# Patient Record
Sex: Male | Born: 1995 | Race: Black or African American | Hispanic: No | Marital: Single | Smoking: Never smoker
Health system: Southern US, Community
[De-identification: ages and names within clinical notes are randomized; demographics above are authoritative.]

## PROBLEM LIST (undated history)

## (undated) DIAGNOSIS — I10 Essential (primary) hypertension: Secondary | ICD-10-CM

## (undated) DIAGNOSIS — N186 End stage renal disease: Secondary | ICD-10-CM

## (undated) DIAGNOSIS — N051 Unspecified nephritic syndrome with focal and segmental glomerular lesions: Secondary | ICD-10-CM

## (undated) DIAGNOSIS — J189 Pneumonia, unspecified organism: Secondary | ICD-10-CM

## (undated) DIAGNOSIS — R112 Nausea with vomiting, unspecified: Secondary | ICD-10-CM

## (undated) DIAGNOSIS — J9600 Acute respiratory failure, unspecified whether with hypoxia or hypercapnia: Secondary | ICD-10-CM

---
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
## 2014-07-19 ENCOUNTER — Emergency Department (HOSPITAL_COMMUNITY)
Admission: EM | Admit: 2014-07-19 | Discharge: 2014-07-20 | Disposition: A | Discharge status: Home / Self Care | Payer: Medicaid Other | Attending: Emergency Medicine | Admitting: Emergency Medicine

## 2014-07-19 ENCOUNTER — Encounter (HOSPITAL_COMMUNITY): Payer: Self-pay | Admitting: Emergency Medicine

## 2014-07-19 DIAGNOSIS — N051 Unspecified nephritic syndrome with focal and segmental glomerular lesions: Secondary | ICD-10-CM | POA: Diagnosis not present

## 2014-07-19 DIAGNOSIS — Z79899 Other long term (current) drug therapy: Secondary | ICD-10-CM | POA: Insufficient documentation

## 2014-07-19 DIAGNOSIS — R1033 Periumbilical pain: Secondary | ICD-10-CM | POA: Insufficient documentation

## 2014-07-19 DIAGNOSIS — N184 Chronic kidney disease, stage 4 (severe): Secondary | ICD-10-CM | POA: Insufficient documentation

## 2014-07-19 DIAGNOSIS — R112 Nausea with vomiting, unspecified: Secondary | ICD-10-CM | POA: Insufficient documentation

## 2014-07-19 DIAGNOSIS — R197 Diarrhea, unspecified: Secondary | ICD-10-CM | POA: Diagnosis not present

## 2014-07-19 LAB — COMPREHENSIVE METABOLIC PANEL
ALT: 16 U/L (ref 0–53)
AST: 20 U/L (ref 0–37)
Albumin: 3.6 g/dL (ref 3.5–5.2)
Alkaline Phosphatase: 85 U/L (ref 39–117)
Anion gap: 14 (ref 5–15)
BUN: 46 mg/dL — AB (ref 6–23)
CALCIUM: 7.3 mg/dL — AB (ref 8.4–10.5)
CO2: 24 mmol/L (ref 19–32)
CREATININE: 5.24 mg/dL — AB (ref 0.50–1.35)
Chloride: 100 mmol/L (ref 96–112)
GFR calc Af Amer: 17 mL/min — ABNORMAL LOW (ref >90)
GFR, EST NON AFRICAN AMERICAN: 14 mL/min — AB (ref >90)
GLUCOSE: 114 mg/dL — AB (ref 70–99)
Potassium: 3.4 mmol/L — ABNORMAL LOW (ref 3.5–5.1)
Sodium: 138 mmol/L (ref 135–145)
TOTAL PROTEIN: 6.9 g/dL (ref 6.0–8.3)
Total Bilirubin: 0.5 mg/dL (ref 0.3–1.2)

## 2014-07-19 LAB — CBC WITH DIFFERENTIAL/PLATELET
BASOS ABS: 0 10*3/uL (ref 0.0–0.1)
BASOS PCT: 0 % (ref 0–1)
EOS ABS: 0.4 10*3/uL (ref 0.0–0.7)
EOS PCT: 3 % (ref 0–5)
HEMATOCRIT: 44.6 % (ref 39.0–52.0)
Hemoglobin: 14.7 g/dL (ref 13.0–17.0)
Lymphocytes Relative: 14 % (ref 12–46)
Lymphs Abs: 1.6 10*3/uL (ref 0.7–4.0)
MCH: 26.1 pg (ref 26.0–34.0)
MCHC: 33 g/dL (ref 30.0–36.0)
MCV: 79.1 fL (ref 78.0–100.0)
MONO ABS: 0.6 10*3/uL (ref 0.1–1.0)
MONOS PCT: 5 % (ref 3–12)
NEUTROS ABS: 9.2 10*3/uL — AB (ref 1.7–7.7)
Neutrophils Relative %: 78 % — ABNORMAL HIGH (ref 43–77)
Platelets: 292 10*3/uL (ref 150–400)
RBC: 5.64 MIL/uL (ref 4.22–5.81)
RDW: 13.9 % (ref 11.5–15.5)
WBC: 11.8 10*3/uL — AB (ref 4.0–10.5)

## 2014-07-19 MED ORDER — SODIUM CHLORIDE 0.9 % IV BOLUS (SEPSIS)
1000.0000 mL | Freq: Once | INTRAVENOUS | Status: AC
  Administered 2014-07-19: 1000 mL via INTRAVENOUS

## 2014-07-19 MED ORDER — SODIUM CHLORIDE 0.9 % IV BOLUS (SEPSIS)
1000.0000 mL | Freq: Once | INTRAVENOUS | Status: AC
  Administered 2014-07-20: 1000 mL via INTRAVENOUS

## 2014-07-19 NOTE — ED Notes (Signed)
Patient with N/V/D since 1800 tonight, after eating at [employer].  Patient is hypertensive, history of kidney disease.  Patient last vomited around 8pm.  Patient having abdominal pain in ULQ and URQ.

## 2014-07-19 NOTE — ED Provider Notes (Signed)
CSN: [id number]     Arrival date & time 07/19/14  2056 History  This chart was scribed for Jeremy Mackie, MD by Annye Asa, ED Scribe. This patient was seen in room A09C/A09C and the patient's care was started at 11:32 PM.    Chief Complaint  Patient presents with  . Nausea  . Emesis  . Diarrhea   Patient is a 19 y.o. male presenting with vomiting and diarrhea. The history is provided by the patient. No language interpreter was used.  Emesis Associated symptoms: abdominal pain, chills and diarrhea   Diarrhea Associated symptoms: abdominal pain, chills and vomiting   Associated symptoms: no fever      HPI Comments: Jeremy Huerta is a 18 y.o. male with past medical history of FSGS who presents to the Emergency Department complaining of abdominal pain, nausea, vomiting (last episode 23:00), diarrhea (last episode 23:00) beginning around 17:00 this evening after eating dinner ("burger, [place] wrap") at his [place]. He reports chills this morning. He denies fever.   His nephrology care is managed by Dr. Primitivo Gauze at [facility]; last seen 2-3 weeks PTA. Patient has been told he will need dialysis or a kidney transplant (he is currently on the list). He is unaware of any baseline levels or numbers related to his disorder.   Past Medical History  Diagnosis Date  . Renal disorder    History reviewed. No pertinent past surgical history. No family history on file. History  Substance Use Topics  . Smoking status: Never Smoker   . Smokeless tobacco: Not on file  . Alcohol Use: Not on file    Review of Systems  Constitutional: Positive for chills. Negative for fever.  Gastrointestinal: Positive for nausea, vomiting, abdominal pain and diarrhea.  All other systems reviewed and are negative.   Allergies  Review of patient's allergies indicates no known allergies.  Home Medications   Prior to Admission medications   Medication Sig Start Date End Date Taking? Authorizing  Provider  amLODipine (NORVASC) 10 MG tablet Take 10 mg by mouth 2 (two) times daily. 07/15/14  Yes Historical Provider, MD  carvedilol (COREG) 3.125 MG tablet Take 3.125 mg by mouth 2 (two) times daily. 06/23/14  Yes Historical Provider, MD  cycloSPORINE (SANDIMMUNE) 25 MG capsule Take 25 mg by mouth 4 (four) times daily. 07/08/14  Yes Historical Provider, MD  furosemide (LASIX) 20 MG tablet Take 20 mg by mouth daily. 07/08/14  Yes Historical Provider, MD   BP 124/75 mmHg  Pulse 74  Temp(Src) 98.1 F (36.7 C) (Oral)  Resp 16  SpO2 100% Physical Exam  Constitutional: He is oriented to person, place, and time. He appears well-developed and well-nourished. No distress.  HENT:  Head: Normocephalic and atraumatic.  Mouth/Throat: Oropharynx is clear and moist. No oropharyngeal exudate.  Eyes: EOM are normal. Pupils are equal, round, and reactive to light.  Neck: Normal range of motion. Neck supple.  Cardiovascular: Normal rate, regular rhythm and normal heart sounds.  Exam reveals no gallop and no friction rub.   No murmur heard. Pulmonary/Chest: Effort normal. No respiratory distress. He has no wheezes. He has no rales.  Abdominal: Soft. There is tenderness (Mild periumbilical tenderness). There is no rebound and no guarding.  Musculoskeletal: Normal range of motion. He exhibits no edema.  Neurological: He is alert and oriented to person, place, and time.  Skin: Skin is warm and dry. No rash noted.  Psychiatric: He has a normal mood and affect. His behavior is normal.  Nursing note and vitals reviewed.   ED Course  Procedures   DIAGNOSTIC STUDIES: Oxygen Saturation is 100% on RA, normal by my interpretation.    COORDINATION OF CARE: 11:40 PM Discussed treatment plan with pt at bedside and pt agreed to plan.   Labs Review Labs Reviewed  CBC WITH DIFFERENTIAL/PLATELET - Abnormal; Notable for the following:    WBC 11.8 (*)    Neutrophils Relative % 78 (*)    Neutro Abs 9.2 (*)    All  other components within normal limits  COMPREHENSIVE METABOLIC PANEL - Abnormal; Notable for the following:    Potassium 3.4 (*)    Glucose, Bld 114 (*)    BUN 46 (*)    Creatinine, Ser 5.24 (*)    Calcium 7.3 (*)    GFR calc non Af Amer 14 (*)    GFR calc Af Amer 17 (*)    All other components within normal limits  URINALYSIS, ROUTINE W REFLEX MICROSCOPIC - Abnormal; Notable for the following:    APPearance CLOUDY (*)    Hgb urine dipstick SMALL (*)    Protein, ur >300 (*)    All other components within normal limits  URINE MICROSCOPIC-ADD ON - Abnormal; Notable for the following:    Casts HYALINE CASTS (*)    All other components within normal limits    Imaging Review No results found.   EKG Interpretation None      MDM   Final diagnoses:  Nausea vomiting and diarrhea  Focal glomerular sclerosis  Chronic renal failure syndrome, stage 4 (severe)    19 year old male with acute onset of nausea, vomiting and diarrhea this evening.  Patient has a history of FSGS [facility].  Per their notes, he is less than compliant.  Patient nontoxic on exam.  Some mild periumbilical pain.  Blood pressure noted be elevated.  Creatinine today 5.24.  [facility].  Per patient, he is on the list for transplant.  Dialysis is also been discussed.  Patient without hyperkalemia.  Plan for fluid hydration, Zofran.  I discussed case with Dr. page [facility].  He will have the clinic get in touch with the patient for close follow-up.`  I personally performed the services described in this documentation, which was scribed in my presence. The recorded information has been reviewed and is accurate.   5:21 AM Patient has tolerated crackers and ginger ale.  Will discharge home.    Jeremy Mackielga M Marizol Borror, MD 07/20/14 [phone]

## 2014-07-20 LAB — URINE MICROSCOPIC-ADD ON

## 2014-07-20 LAB — URINALYSIS, ROUTINE W REFLEX MICROSCOPIC
Bilirubin Urine: NEGATIVE
Glucose, UA: NEGATIVE mg/dL
Ketones, ur: NEGATIVE mg/dL
LEUKOCYTES UA: NEGATIVE
Nitrite: NEGATIVE
SPECIFIC GRAVITY, URINE: 1.013 (ref 1.005–1.030)
UROBILINOGEN UA: 0.2 mg/dL (ref 0.0–1.0)
pH: 6 (ref 5.0–8.0)

## 2014-07-20 MED ORDER — ONDANSETRON HCL 4 MG/2ML IJ SOLN
4.0000 mg | Freq: Once | INTRAMUSCULAR | Status: AC
  Administered 2014-07-20: 4 mg via INTRAVENOUS
  Filled 2014-07-20: qty 2

## 2014-07-20 MED ORDER — PROMETHAZINE HCL 25 MG/ML IJ SOLN
12.5000 mg | Freq: Once | INTRAMUSCULAR | Status: AC
  Administered 2014-07-20: 12.5 mg via INTRAVENOUS
  Filled 2014-07-20: qty 1

## 2014-07-20 MED ORDER — ONDANSETRON 8 MG PO TBDP: 8.0000 mg | ORAL_TABLET | Freq: Three times a day (TID) | ORAL | Status: DC | PRN

## 2014-07-20 NOTE — ED Notes (Signed)
Pt. Left with all belongings and refused wheelchair 

## 2014-07-20 NOTE — ED Notes (Signed)
Jeremy Huerta [phone] - pt's ride

## 2014-07-20 NOTE — ED Notes (Signed)
240 cc of water given  

## 2014-07-20 NOTE — ED Notes (Signed)
Pt's visitor came to nurse's station stating that he needed some help. This RN entered the room to find the pt vomiting.

## 2014-07-20 NOTE — Discharge Instructions (Signed)
Your kidney function is much worse than it was in January.  Your creatinine at this time is 5.24, and your GFR is 17.  Please follow-up with your nephrologist as soon as possible.  Drink plenty of fluids.  Continue taking your current medications.  Return to the emergency department for worsening condition or new concerning symptoms.  Stick to a bland diet until you're feeling better.    Chronic Kidney Disease Chronic kidney disease occurs when the kidneys are damaged over a long period. The kidneys are two organs that lie on either side of the spine between the middle of the back and the front of the abdomen. The kidneys:   Remove wastes and extra water from the blood.   Produce important hormones. These help keep bones strong, regulate blood pressure, and help create red blood cells.   Balance the fluids and chemicals in the blood and tissues. A small amount of kidney damage may not cause problems, but a large amount of damage may make it difficult or impossible for the kidneys to work the way they should. If steps are not taken to slow down the kidney damage or stop it from getting worse, the kidneys may stop working permanently. Most of the time, chronic kidney disease does not go away. However, it can often be controlled, and those with the disease can usually live normal lives. CAUSES  The most common causes of chronic kidney disease are diabetes and high blood pressure (hypertension). Chronic kidney disease may also be caused by:   Diseases that cause the kidneys' filters to become inflamed.   Diseases that affect the immune system.   Genetic diseases.   Medicines that damage the kidneys, such as anti-inflammatory medicines.  Poisoning or exposure to toxic substances.   A reoccurring kidney or urinary infection.   A problem with urine flow. This may be caused by:   Cancer.   Kidney stones.   An enlarged prostate in males. SIGNS AND SYMPTOMS  Because the kidney  damage in chronic kidney disease occurs slowly, symptoms develop slowly and may not be obvious until the kidney damage becomes severe. A person may have a kidney disease for years without showing any symptoms. Symptoms can include:   Swelling (edema) of the legs, ankles, or feet.   Tiredness (lethargy).   Nausea or vomiting.   Confusion.   Problems with urination, such as:   Decreased urine production.   Frequent urination, especially at night.   Frequent accidents in children who are potty trained.   Muscle twitches and cramps.   Shortness of breath.  Weakness.   Persistent itchiness.   Loss of appetite.  Metallic taste in the mouth.  Trouble sleeping.  Slowed development in children.  Short stature in children. DIAGNOSIS  Chronic kidney disease may be detected and diagnosed by tests, including blood, urine, imaging, or kidney biopsy tests.  TREATMENT  Most chronic kidney diseases cannot be cured. Treatment usually involves relieving symptoms and preventing or slowing the progression of the disease. Treatment may include:   A special diet. You may need to avoid alcohol and foods thatare salty and high in potassium.   Medicines. These may:   Lower blood pressure.   Relieve anemia.   Relieve swelling.   Protect the bones. HOME CARE INSTRUCTIONS   Follow your prescribed diet.   Take medicines only as directed by your health care provider. Do not take any new medicines (prescription, over-the-counter, or nutritional supplements) unless approved by your health care  provider. Many medicines can worsen your kidney damage or need to have the dose adjusted.   Quit smoking if you smoke. Talk to your health care provider about a smoking cessation program.   Keep all follow-up visits as directed by your health care provider. SEEK IMMEDIATE MEDICAL CARE IF:  Your symptoms get worse or you develop new symptoms.   You develop symptoms of  end-stage kidney disease. These include:   Headaches.   Abnormally dark or light skin.   Numbness in the hands or feet.   Easy bruising.   Frequent hiccups.   Menstruation stops.   You have a fever.   You have decreased urine production.   You havepain or bleeding when urinating. MAKE SURE YOU:  Understand these instructions.  Will watch your condition.  Will get help right away if you are not doing well or get worse. FOR MORE INFORMATION   American Association of Kidney Patients: ResidentialShow.is  National Kidney Foundation: www.kidney.org  American Kidney Fund: FightingMatch.com.ee  [facility]: www.lifeoptions.org and www.kidneyschool.org Document Released: 02/13/2008 Document Revised: 09/20/2013 Document Reviewed: 01/03/2012 [facility] Patient Information 2015 [place], [place]. This information is not intended to replace advice given to you by your health care provider. Make sure you discuss any questions you have with your health care provider.  Dialysis Diet A dialysis diet is a special diet when you start peritoneal dialysis or hemodialysis. Foods must be chosen carefully because different foods produce different wastes in your blood. If you are on dialysis treatment, that means your kidneys have stopped working properly on their own. This means the kidneys are removing very little or no wastes from your blood. Dialysis can perform the function of a healthy kidney and filter wastes from your body. However, between dialysis sessions, wastes build up in your blood and can make you sick. You can reduce the amount of wastes that build up in your blood by watching what you eat and drink. A good meal plan can improve your dialysis and your health. Your dietitian or renal dietitian can help you plan meals.  FLUIDS In between dialysis sessions, your kidneys may be able to remove some fluid or none at all. Fluid can build up and cause swelling and weight  gain. The extra fluid affects your blood pressure and can make your heart work harder. Overloading your body with fluid could lead to serious heart trouble. Every person on dialysis has a different amount of fluid that they can have each day. Talk to your dietitian about how much fluid you can have each day and write that down.  Foods that add to your fluid intake include:  Foods that contain liquid at room temperature, such as soup, gelatin dessert, and ice cream.  Fruits and vegetables, such as melons, grapes, apples, oranges, tomatoes, lettuce, and celery. Your dietitian will be able to give you other tips for managing your thirst. POTASSIUM Potassium is a mineral found in many foods, especially milk, fruits, and vegetables. It affects how steadily your heart beats. Potassium levels can rise between dialysis sessions and can affect your heartbeat and may even cause death.  Avoid foods high in potassium. If you do eat high-potassium foods, eat smaller portions. For example, eat half a pear instead of a whole pear. Eat only very small portions of oranges and melons. You can remove some of the potassium from potatoes and other vegetables by peeling them and then soaking them in a large amount of water for several hours. Drain  and rinse them before cooking. Talk to a dietitian about foods you can eat instead of high-potassium foods. High-potassium foods and drinks include:  Apricots.  Brussels sprouts.  Dates.  Lima beans.  Oranges.  Prune juice.  Spinach.  Avocados.  Milk.  Figs.  Melons.  Peanuts.  Prunes.  Tomatoes.  Bananas.  Cantaloupe.  Kiwi fruit.  Nectarines.  Asparagus spears.  Raisins.  Winter squash.  Beets.  Clams.  Orange juice.  Potatoes.  Sardines.  Yogurt. PHOSPHORUS Phosphorus is a mineral found in many foods. If you have too much phosphorus in your blood, your bones lose calcium. Losing calcium will make your bones weak and more  likely to break. Also, too much phosphorus may make your skin itch. Food high in phosphorus include milk, cheese, dried beans, peas, colas, nuts, and peanut butter. Avoid eating too much of these foods. Usually, people on dialysis are limited to  cup of milk per day. Talk to a dietitian about foods you can eat instead of high-phosphorus foods. PROTEIN You may be encouraged to eat as much "high-quality protein" as you can. High-quality proteins come from meat, fish, poultry, and eggs. Choose low-fat (lean) meats that are also low in phosphorus. If you are a vegetarian, ask your dietitian about other ways to get your protein. Low-fat milk is a good source of protein, but milk is high in phosphorus and potassium and adds to your fluid intake. Talk to a dietitian to see if milk fits into your food plan. SODIUM Sodium makes you thirsty, and if you are on dialysis, you must restrict how much fluid you drink. Therefore, try to eat fresh foods that are naturally low in sodium. Stay away from canned foods and frozen dinners. Look for products labeled "low sodium." Do not use salt substitutes because they contain potassium. Talk to your dietitian about foods and spice blends without sodium or potassium.  CALORIES Calories come from food and provide energy for your body. Your dietitian may recommend adding or cutting down on the calories you eat depending on if you need to lose or gain weight. Talk to a dietitian about foods you can eat to either gain or lose weight. VITAMINS AND MINERALS Your caregiver may prescribe a vitamin and mineral supplement. Vitamins and minerals may be missing from your diet because you have to avoid so many foods. Talk to your dietitian or kidney doctor before choosing a supplement. Many vitamin or mineral supplements sold on the store shelf may be harmful to you. Document Released: 02/01/2004 Document Revised: 11/05/2011 Document Reviewed: 07/16/2011 [facility] Patient Information 2015  [place], [place]. This information is not intended to replace advice given to you by your health care provider. Make sure you discuss any questions you have with your health care provider.  End-Stage Kidney Disease The kidneys are two organs that lie on either side of the spine between the middle of the back and the front of the abdomen. The kidneys:   Remove wastes and extra water from the blood.   Produce important hormones. These help keep bones strong, regulate blood pressure, and help create red blood cells.   Balance the fluids and chemicals in the blood and tissues. End-stage kidney disease occurs when the kidneys are so damaged that they cannot do their job. When the kidneys cannot do their job, life-threatening problems occur. The body cannot stay clean and strong without the help of the kidneys. In end-stage kidney disease, the kidneys cannot get better.You need a new kidney  or treatments to do some of the work healthy kidneys do in order to stay alive. CAUSES  End-stage kidney disease usually occurs when a long-lasting (chronic) kidney disease gets worse. It may also occur after the kidneys are suddenly damaged (acute kidney injury).  SYMPTOMS   Swelling (edema) of the legs, ankles, or feet.   Tiredness (lethargy).   Nausea or vomiting.   Confusion.   Problems with urination, such as:   Decreased urine production.   Frequent urination, especially at night.   Frequent accidents in children who are potty trained.   Muscle twitches and cramps.   Persistent itchiness.   Loss of appetite.   Headaches.   Abnormally dark or light skin.   Numbness in the hands or feet.   Easy bruising.   Frequent hiccups.   Menstruation stops. DIAGNOSIS  Your health care provider will measure your blood pressure and take some tests. These may include:   Urine tests.   Blood tests.   Imaging tests, such as:   An ultrasound exam.   Computed tomography  (CT).  A kidney biopsy. TREATMENT  There are two treatments for end-stage kidney disease:   A procedure that removes toxic wastes from the body (dialysis).   Receiving a new kidney (kidney transplant). Both of these treatments have serious risks and consequences. Your health care provider will help you determine which treatment is best for you based on your health, age, and other factors. In addition to having dialysis or a kidney transplant, you may need to take medicines to control high blood pressure (hypertension) and cholesterol and to decrease phosphorus levels in your blood.  HOME CARE INSTRUCTIONS  Follow your prescribed diet.   Take medicines only as directed by your health care provider.   Do not take any new medicines (prescription, over-the-counter, or nutritional supplements) unless approved by your health care provider. Many medicines can worsen your kidney damage or need to have the dose adjusted.   Keep all follow-up visits as directed by your health care provider. MAKE SURE YOU:  Understand these instructions.  Will watch your condition.  Will get help right away if you are not doing well or get worse. Document Released: 07/27/2003 Document Revised: 09/20/2013 Document Reviewed: 01/03/2012 [facility] Patient Information 2015 [place], [place]. This information is not intended to replace advice given to you by your health care provider. Make sure you discuss any questions you have with your health care provider.  Nausea and Vomiting Nausea is a sick feeling that often comes before throwing up (vomiting). Vomiting is a reflex where stomach contents come out of your mouth. Vomiting can cause severe loss of body fluids (dehydration). Children and elderly adults can become dehydrated quickly, especially if they also have diarrhea. Nausea and vomiting are symptoms of a condition or disease. It is important to find the cause of your symptoms. CAUSES   Direct irritation of  the stomach lining. This irritation can result from increased acid production (gastroesophageal reflux disease), infection, food poisoning, taking certain medicines (such as nonsteroidal anti-inflammatory drugs), alcohol use, or tobacco use.  Signals from the brain.These signals could be caused by a headache, heat exposure, an inner ear disturbance, increased pressure in the brain from injury, infection, a tumor, or a concussion, pain, emotional stimulus, or metabolic problems.  An obstruction in the gastrointestinal tract (bowel obstruction).  Illnesses such as diabetes, hepatitis, gallbladder problems, appendicitis, kidney problems, cancer, sepsis, atypical symptoms of a heart attack, or eating disorders.  Medical treatments such  as chemotherapy and radiation.  Receiving medicine that makes you sleep (general anesthetic) during surgery. DIAGNOSIS Your caregiver may ask for tests to be done if the problems do not improve after a few days. Tests may also be done if symptoms are severe or if the reason for the nausea and vomiting is not clear. Tests may include:  Urine tests.  Blood tests.  Stool tests.  Cultures (to look for evidence of infection).  X-rays or other imaging studies. Test results can help your caregiver make decisions about treatment or the need for additional tests. TREATMENT You need to stay well hydrated. Drink frequently but in small amounts.You may wish to drink water, sports drinks, clear broth, or eat frozen ice pops or gelatin dessert to help stay hydrated.When you eat, eating slowly may help prevent nausea.There are also some antinausea medicines that may help prevent nausea. HOME CARE INSTRUCTIONS   Take all medicine as directed by your caregiver.  If you do not have an appetite, do not force yourself to eat. However, you must continue to drink fluids.  If you have an appetite, eat a normal diet unless your caregiver tells you differently.  Eat a variety  of complex carbohydrates (rice, wheat, potatoes, bread), lean meats, yogurt, fruits, and vegetables.  Avoid high-fat foods because they are more difficult to digest.  Drink enough water and fluids to keep your urine clear or pale yellow.  If you are dehydrated, ask your caregiver for specific rehydration instructions. Signs of dehydration may include:  Severe thirst.  Dry lips and mouth.  Dizziness.  Dark urine.  Decreasing urine frequency and amount.  Confusion.  Rapid breathing or pulse. SEEK IMMEDIATE MEDICAL CARE IF:   You have blood or brown flecks (like coffee grounds) in your vomit.  You have black or bloody stools.  You have a severe headache or stiff neck.  You are confused.  You have severe abdominal pain.  You have chest pain or trouble breathing.  You do not urinate at least once every 8 hours.  You develop cold or clammy skin.  You continue to vomit for longer than 24 to 48 hours.  You have a fever. MAKE SURE YOU:   Understand these instructions.  Will watch your condition.  Will get help right away if you are not doing well or get worse. Document Released: 05/06/2005 Document Revised: 07/29/2011 Document Reviewed: 10/03/2010 [facility] Patient Information 2015 [place]. This information is not intended to replace advice given to you by your health care provider. Make sure you discuss any questions you have with your health care provider.

## 2015-12-19 HISTORY — PX: AV FISTULA PLACEMENT: SHX1204

## 2016-02-13 ENCOUNTER — Emergency Department (HOSPITAL_COMMUNITY)
Admission: EM | Admit: 2016-02-13 | Discharge: 2016-02-13 | Disposition: A | Discharge status: Home / Self Care | Payer: Medicaid Other | Attending: Emergency Medicine | Admitting: Emergency Medicine

## 2016-02-13 ENCOUNTER — Emergency Department (HOSPITAL_COMMUNITY): Payer: Medicaid Other

## 2016-02-13 ENCOUNTER — Encounter (HOSPITAL_COMMUNITY): Payer: Self-pay | Admitting: Emergency Medicine

## 2016-02-13 DIAGNOSIS — R112 Nausea with vomiting, unspecified: Secondary | ICD-10-CM

## 2016-02-13 DIAGNOSIS — R197 Diarrhea, unspecified: Secondary | ICD-10-CM | POA: Insufficient documentation

## 2016-02-13 DIAGNOSIS — Z79899 Other long term (current) drug therapy: Secondary | ICD-10-CM | POA: Diagnosis not present

## 2016-02-13 LAB — COMPREHENSIVE METABOLIC PANEL
ALT: 14 U/L — ABNORMAL LOW (ref 17–63)
ANION GAP: 16 — AB (ref 5–15)
AST: 19 U/L (ref 15–41)
Albumin: 4.5 g/dL (ref 3.5–5.0)
Alkaline Phosphatase: 67 U/L (ref 38–126)
BUN: 57 mg/dL — AB (ref 6–20)
CHLORIDE: 105 mmol/L (ref 101–111)
CO2: 20 mmol/L — ABNORMAL LOW (ref 22–32)
Calcium: 10.2 mg/dL (ref 8.9–10.3)
Creatinine, Ser: 14.26 mg/dL — ABNORMAL HIGH (ref 0.61–1.24)
GFR calc Af Amer: 5 mL/min — ABNORMAL LOW (ref >60)
GFR, EST NON AFRICAN AMERICAN: 4 mL/min — AB (ref >60)
Glucose, Bld: 93 mg/dL (ref 65–99)
Potassium: 4.5 mmol/L (ref 3.5–5.1)
Sodium: 141 mmol/L (ref 135–145)
Total Bilirubin: 0.4 mg/dL (ref 0.3–1.2)
Total Protein: 7.3 g/dL (ref 6.5–8.1)

## 2016-02-13 LAB — CBC
HEMATOCRIT: 42 % (ref 39.0–52.0)
HEMOGLOBIN: 13.1 g/dL (ref 13.0–17.0)
MCH: 28.2 pg (ref 26.0–34.0)
MCHC: 31.2 g/dL (ref 30.0–36.0)
MCV: 90.3 fL (ref 78.0–100.0)
Platelets: 303 10*3/uL (ref 150–400)
RBC: 4.65 MIL/uL (ref 4.22–5.81)
RDW: 15.3 % (ref 11.5–15.5)
WBC: 10.7 10*3/uL — AB (ref 4.0–10.5)

## 2016-02-13 LAB — LIPASE, BLOOD: Lipase: 38 U/L (ref 11–51)

## 2016-02-13 MED ORDER — ONDANSETRON 4 MG PO TBDP
ORAL_TABLET | ORAL | Status: AC
  Filled 2016-02-13: qty 2

## 2016-02-13 MED ORDER — ONDANSETRON 4 MG PO TBDP
8.0000 mg | ORAL_TABLET | Freq: Once | ORAL | Status: AC
  Administered 2016-02-13: 8 mg via ORAL

## 2016-02-13 MED ORDER — FENTANYL CITRATE (PF) 100 MCG/2ML IJ SOLN
50.0000 ug | Freq: Once | INTRAMUSCULAR | Status: AC
  Administered 2016-02-13: 50 ug via INTRAVENOUS
  Filled 2016-02-13: qty 2

## 2016-02-13 MED ORDER — LOPERAMIDE HCL 2 MG PO TABS: 2.0000 mg | ORAL_TABLET | Freq: Four times a day (QID) | ORAL | 0 refills | Status: DC | PRN

## 2016-02-13 MED ORDER — ONDANSETRON HCL 4 MG/2ML IJ SOLN
4.0000 mg | Freq: Once | INTRAMUSCULAR | Status: AC
  Administered 2016-02-13: 4 mg via INTRAVENOUS
  Filled 2016-02-13: qty 2

## 2016-02-13 MED ORDER — SODIUM CHLORIDE 0.9 % IV BOLUS (SEPSIS)
250.0000 mL | Freq: Once | INTRAVENOUS | Status: AC
  Administered 2016-02-13: 250 mL via INTRAVENOUS

## 2016-02-13 MED ORDER — ONDANSETRON 4 MG PO TBDP: 4.0000 mg | ORAL_TABLET | Freq: Three times a day (TID) | ORAL | 1 refills | Status: DC | PRN

## 2016-02-13 MED ORDER — SODIUM CHLORIDE 0.9 % IV SOLN: INTRAVENOUS | Status: DC

## 2016-02-13 MED ORDER — SODIUM CHLORIDE 0.9 % IV SOLN
INTRAVENOUS | Status: DC
  Administered 2016-02-13: 09:00:00 via INTRAVENOUS

## 2016-02-13 NOTE — ED Notes (Signed)
Pt requesting to speak with MD prior to d/c.  Deretha EmoryZackowski MD made aware and will come see pt.

## 2016-02-13 NOTE — ED Notes (Signed)
Pt is aware urine is needed, pt unable to give a sample at this time.

## 2016-02-13 NOTE — Discharge Instructions (Signed)
Take the Zofran as needed for the nausea and vomiting. Take the Imodium right ear as needed for the diarrhea. Follow-up with dialysis.

## 2016-02-13 NOTE — ED Notes (Signed)
RN to room to round on pt.  Pt was lying on floor reporting that he had fallen.  Pt reported he needed to have a BM and that's why he was out of bed.  Bedrails up x2 and call bell within pt reach at time of fall.  RN questioned why call bell was not utilized and pt stated "I thought I had pushed it."  No obvious injuries and Zackowski MD made aware of pt fall.  RN offered to place pt on bedpan to have BM pt denied having to have BM.

## 2016-02-13 NOTE — ED Notes (Signed)
Pt found out of bed walking around room by RN.  Pt reports that he is unable to sit still due to nausea.  MD made aware and pt made aware to not walk around room due to recent fall.  Bedrails remained up and call bell remained within pt reach.

## 2016-02-13 NOTE — ED Provider Notes (Signed)
[facility]-EMERGENCY DEPT Provider Note   CSN: [id number] Arrival date & time: 02/13/16  0158     History   Chief Complaint Chief Complaint  Patient presents with  . Abdominal Pain    HPI Jeremy Huerta is a 20 y.o. male.  Patient is a dialysis patient normally dialyzed Tuesdays Thursdays and Saturdays. Patient gets all his dialysis care and follow-up at [facility]. Patient with onset of nausea vomiting and diarrhea during the night. Was in our waiting room for at least 6 hours before being brought back into the emergency department. Patient with complaint of some mild periumbilical abdominal pain. Patient states that 3-4 episodes of vomiting and diarrhea. No blood. Patient scheduled for dialysis later today.      Past Medical History:  Diagnosis Date  . Renal disorder     There are no active problems to display for this patient.   History reviewed. No pertinent surgical history.     Home Medications    Prior to Admission medications   Medication Sig Start Date End Date Taking? Authorizing Provider  amLODipine (NORVASC) 10 MG tablet Take 10 mg by mouth daily.  07/15/14  Yes Historical Provider, MD  carvedilol (COREG) 3.125 MG tablet Take 3.125 mg by mouth 2 (two) times daily. 06/23/14  Yes Historical Provider, MD  furosemide (LASIX) 20 MG tablet Take 20 mg by mouth daily. 07/08/14  Yes Historical Provider, MD  hydrALAZINE (APRESOLINE) 25 MG tablet Take 50 mg by mouth 3 (three) times daily.   Yes Historical Provider, MD  isosorbide mononitrate (IMDUR) 60 MG 24 hr tablet Take 60 mg by mouth daily. 12/31/15  Yes Historical Provider, MD  labetalol (NORMODYNE) 200 MG tablet Take 400 mg by mouth every 12 (twelve) hours. 01/09/16  Yes Historical Provider, MD  losartan (COZAAR) 25 MG tablet Take 75 mg by mouth. 12/31/15  Yes Historical Provider, MD  pantoprazole (PROTONIX) 20 MG tablet Take 20 mg by mouth 2 (two) times daily.   Yes Historical Provider, MD  pantoprazole (PROTONIX)  40 MG tablet Take 40 mg by mouth 2 (two) times daily.   Yes Historical Provider, MD  RENAGEL 800 MG tablet Take 3 tablets by mouth 3 (three) times daily. 12/05/15  Yes Historical Provider, MD  cycloSPORINE (SANDIMMUNE) 25 MG capsule Take 25 mg by mouth 4 (four) times daily. 07/08/14   Historical Provider, MD  loperamide (IMODIUM A-D) 2 MG tablet Take 1 tablet (2 mg total) by mouth 4 (four) times daily as needed for diarrhea or loose stools. 02/13/16   Vanetta MuldersScott Lolah Coghlan, MD  losartan (COZAAR) 25 MG tablet Take 75 mg by mouth daily.    Historical Provider, MD  ondansetron (ZOFRAN ODT) 4 MG disintegrating tablet Take 1 tablet (4 mg total) by mouth every 8 (eight) hours as needed for nausea or vomiting. 02/13/16   Vanetta MuldersScott Sanya Kobrin, MD  ondansetron (ZOFRAN ODT) 8 MG disintegrating tablet Take 1 tablet (8 mg total) by mouth every 8 (eight) hours as needed for nausea or vomiting. Patient not taking: Reported on 02/13/2016 07/20/14   Marisa Severinlga Otter, MD    Family History No family history on file.  Social History Social History  Substance Use Topics  . Smoking status: Never Smoker  . Smokeless tobacco: Never Used  . Alcohol use No     Allergies   Review of patient's allergies indicates no known allergies.   Review of Systems Review of Systems  Constitutional: Negative for fever.  HENT: Negative for congestion.   Eyes: Negative for  visual disturbance.  Respiratory: Negative for shortness of breath.   Cardiovascular: Negative for chest pain.  Gastrointestinal: Positive for abdominal pain, diarrhea, nausea and vomiting.  Musculoskeletal: Negative for back pain.  Neurological: Negative for headaches.  Hematological: Bruises/bleeds easily.  Psychiatric/Behavioral: Negative for confusion.     Physical Exam Updated Vital Signs BP 165/95   Pulse 93   Temp 97.9 F (36.6 C) (Oral)   Resp 20   SpO2 100%   Physical Exam  Constitutional: He is oriented to person, place, and time. He appears  well-developed and well-nourished. No distress.  HENT:  Head: Normocephalic and atraumatic.  Mouth/Throat: Oropharynx is clear and moist.  Eyes: EOM are normal. Pupils are equal, round, and reactive to light.  Neck: Normal range of motion. Neck supple.  Cardiovascular: Normal rate, regular rhythm and normal heart sounds.   Pulmonary/Chest: Effort normal and breath sounds normal. No respiratory distress.  Chest temporary dialysis catheter right upper chest  Abdominal: Soft. Bowel sounds are normal. He exhibits no distension. There is no tenderness.  Musculoskeletal: Normal range of motion.  Left arm with a new AV fistula is not being utilized.  Neurological: He is alert and oriented to person, place, and time. No cranial nerve deficit. He exhibits normal muscle tone. Coordination normal.  Skin: Skin is warm.  Nursing note and vitals reviewed.    ED Treatments / Results  Labs (all labs ordered are listed, but only abnormal results are displayed) Labs Reviewed  COMPREHENSIVE METABOLIC PANEL - Abnormal; Notable for the following:       Result Value   CO2 20 (*)    BUN 57 (*)    Creatinine, Ser 14.26 (*)    ALT 14 (*)    GFR calc non Af Amer 4 (*)    GFR calc Af Amer 5 (*)    Anion gap 16 (*)    All other components within normal limits  CBC - Abnormal; Notable for the following:    WBC 10.7 (*)    All other components within normal limits  LIPASE, BLOOD  URINALYSIS, ROUTINE W REFLEX MICROSCOPIC (NOT AT [facility])    EKG  EKG Interpretation None       Radiology Dg Abd Acute W/chest  Result Date: 02/13/2016 CLINICAL DATA:  Abdominal pain with nausea and vomiting EXAM: DG ABDOMEN ACUTE W/ 1V CHEST COMPARISON:  None. FINDINGS: PA chest: Central catheter tip is at cavoatrial junction. No pneumothorax. There is no appreciable edema or consolidation. Heart is borderline enlarged with pulmonary vascularity within normal limits. No adenopathy. Supine and upright abdomen: There is stool  throughout the colon. There is no appreciable bowel dilatation. There are scattered air-fluid levels. No free air. No abnormal calcifications. IMPRESSION: Scattered air-fluid levels without bowel dilatation. Suspect early ileus or enteritis. No evident free air. No lung edema or consolidation. Central catheter tip at cavoatrial junction. Borderline cardiomegaly. No pneumothorax. Electronically Signed   By: Bretta Bang III M.D.   On: 02/13/2016 08:51    Procedures Procedures (including critical care time)  Medications Ordered in ED Medications  0.9 %  sodium chloride infusion ( Intravenous New Bag/Given 02/13/16 0908)  ondansetron (ZOFRAN) injection 4 mg (not administered)  fentaNYL (SUBLIMAZE) injection 50 mcg (not administered)  ondansetron (ZOFRAN-ODT) disintegrating tablet 8 mg (8 mg Oral Given 02/13/16 0207)  sodium chloride 0.9 % bolus 250 mL (250 mLs Intravenous New Bag/Given 02/13/16 0905)  ondansetron (ZOFRAN) injection 4 mg (4 mg Intravenous Given 02/13/16 0905)  fentaNYL (SUBLIMAZE) injection  50 mcg (50 mcg Intravenous Given 02/13/16 0905)     Initial Impression / Assessment and Plan / ED Course  I have reviewed the triage vital signs and the nursing notes.  Pertinent labs & imaging results that were available during my care of the patient were reviewed by me and considered in my medical decision making (see chart for details).  Clinical Course    Patient is a dialysis patient normally followed entirely at [facility]. Patient due for dialysis today normally dialyzed Tuesday Thursdays and Saturdays. Patient with a complaint of nausea and vomiting and diarrhea that started today during the night. Patient states 3 episodes of each. Patient improved here with antinausea medicine and some pain medicine. Will be continued on Imodium right ear and Zofran in follow-up for dialysis which is scheduled for later today.  Patients abdomen soft and nontender. Acute abdominal series  without any acute findings. Labs without any significant abnormalities.  Final Clinical Impressions(s) / ED Diagnoses   Final diagnoses:  Nausea vomiting and diarrhea    New Prescriptions New Prescriptions   LOPERAMIDE (IMODIUM A-D) 2 MG TABLET    Take 1 tablet (2 mg total) by mouth 4 (four) times daily as needed for diarrhea or loose stools.   ONDANSETRON (ZOFRAN ODT) 4 MG DISINTEGRATING TABLET    Take 1 tablet (4 mg total) by mouth every 8 (eight) hours as needed for nausea or vomiting.     Vanetta Mulders, MD 02/13/16 [phone]

## 2016-02-13 NOTE — ED Notes (Signed)
Pt refused further IV fluids.  IVF d/c and MD made aware.

## 2016-02-13 NOTE — ED Triage Notes (Signed)
Pt. reports mid abdominal pain with emesis and diarrhea onset this evening , hemodialysis q Tues/Thurs/Sat , denies fever or chills.

## 2016-04-22 ENCOUNTER — Inpatient Hospital Stay (HOSPITAL_COMMUNITY)
Admission: EM | Admit: 2016-04-22 | Discharge: 2016-04-24 | DRG: 640 | Disposition: A | Discharge status: Home / Self Care | Payer: Medicaid Other | Attending: Internal Medicine | Admitting: Internal Medicine

## 2016-04-22 ENCOUNTER — Emergency Department (HOSPITAL_COMMUNITY): Payer: Medicaid Other

## 2016-04-22 ENCOUNTER — Inpatient Hospital Stay (HOSPITAL_COMMUNITY): Payer: Medicaid Other

## 2016-04-22 ENCOUNTER — Encounter (HOSPITAL_COMMUNITY): Payer: Self-pay | Admitting: Emergency Medicine

## 2016-04-22 DIAGNOSIS — G8929 Other chronic pain: Secondary | ICD-10-CM | POA: Diagnosis present

## 2016-04-22 DIAGNOSIS — N269 Renal sclerosis, unspecified: Secondary | ICD-10-CM | POA: Diagnosis present

## 2016-04-22 DIAGNOSIS — N289 Disorder of kidney and ureter, unspecified: Secondary | ICD-10-CM | POA: Insufficient documentation

## 2016-04-22 DIAGNOSIS — E8770 Fluid overload, unspecified: Secondary | ICD-10-CM | POA: Diagnosis present

## 2016-04-22 DIAGNOSIS — R0600 Dyspnea, unspecified: Secondary | ICD-10-CM

## 2016-04-22 DIAGNOSIS — I120 Hypertensive chronic kidney disease with stage 5 chronic kidney disease or end stage renal disease: Secondary | ICD-10-CM | POA: Diagnosis present

## 2016-04-22 DIAGNOSIS — N2581 Secondary hyperparathyroidism of renal origin: Secondary | ICD-10-CM | POA: Diagnosis present

## 2016-04-22 DIAGNOSIS — N186 End stage renal disease: Secondary | ICD-10-CM | POA: Diagnosis not present

## 2016-04-22 DIAGNOSIS — Y95 Nosocomial condition: Secondary | ICD-10-CM | POA: Diagnosis present

## 2016-04-22 DIAGNOSIS — J9601 Acute respiratory failure with hypoxia: Secondary | ICD-10-CM | POA: Diagnosis present

## 2016-04-22 DIAGNOSIS — Z8249 Family history of ischemic heart disease and other diseases of the circulatory system: Secondary | ICD-10-CM | POA: Diagnosis not present

## 2016-04-22 DIAGNOSIS — I10 Essential (primary) hypertension: Secondary | ICD-10-CM | POA: Diagnosis present

## 2016-04-22 DIAGNOSIS — R0902 Hypoxemia: Secondary | ICD-10-CM

## 2016-04-22 DIAGNOSIS — M545 Low back pain: Secondary | ICD-10-CM | POA: Diagnosis present

## 2016-04-22 DIAGNOSIS — R197 Diarrhea, unspecified: Secondary | ICD-10-CM | POA: Diagnosis present

## 2016-04-22 DIAGNOSIS — K529 Noninfective gastroenteritis and colitis, unspecified: Secondary | ICD-10-CM | POA: Diagnosis present

## 2016-04-22 DIAGNOSIS — Z79899 Other long term (current) drug therapy: Secondary | ICD-10-CM | POA: Diagnosis not present

## 2016-04-22 DIAGNOSIS — R0603 Acute respiratory distress: Secondary | ICD-10-CM | POA: Diagnosis present

## 2016-04-22 DIAGNOSIS — J9600 Acute respiratory failure, unspecified whether with hypoxia or hypercapnia: Secondary | ICD-10-CM | POA: Diagnosis present

## 2016-04-22 DIAGNOSIS — D649 Anemia, unspecified: Secondary | ICD-10-CM | POA: Diagnosis present

## 2016-04-22 DIAGNOSIS — Z9115 Patient's noncompliance with renal dialysis: Secondary | ICD-10-CM

## 2016-04-22 DIAGNOSIS — E875 Hyperkalemia: Secondary | ICD-10-CM | POA: Diagnosis present

## 2016-04-22 DIAGNOSIS — E8889 Other specified metabolic disorders: Secondary | ICD-10-CM | POA: Diagnosis present

## 2016-04-22 DIAGNOSIS — R000 Tachycardia, unspecified: Secondary | ICD-10-CM | POA: Diagnosis present

## 2016-04-22 DIAGNOSIS — J189 Pneumonia, unspecified organism: Secondary | ICD-10-CM | POA: Diagnosis not present

## 2016-04-22 DIAGNOSIS — Z992 Dependence on renal dialysis: Secondary | ICD-10-CM | POA: Diagnosis not present

## 2016-04-22 HISTORY — DX: Unspecified nephritic syndrome with focal and segmental glomerular lesions: N05.1

## 2016-04-22 HISTORY — DX: End stage renal disease: N18.6

## 2016-04-22 HISTORY — DX: Acute respiratory failure, unspecified whether with hypoxia or hypercapnia: J96.00

## 2016-04-22 HISTORY — DX: Essential (primary) hypertension: I10

## 2016-04-22 LAB — ECHOCARDIOGRAM COMPLETE
AO mean calculated velocity dopler: 146 cm/s
AOPV: 0.83 m/s
AOVTI: 36.1 cm
AV Area VTI index: 1.27 cm2/m2
AV Area mean vel: 2.67 cm2
AV Mean grad: 10 mmHg
AV Peak grad: 22 mmHg
AV peak Index: 1.24
AV pk vel: 235 cm/s
AVAREAMEANVIN: 1.27 cm2/m2
AVAREAVTI: 2.61 cm2
AVCELMEANRAT: 0.85
CHL CUP AV VALUE AREA INDEX: 1.27
CHL CUP AV VEL: 2.66
CHL CUP RV SYS PRESS: 36 mmHg
CHL CUP STROKE VOLUME: 114 mL
CHL CUP TV REG PEAK VELOCITY: 287 cm/s
FS: 41 % (ref 28–44)
HEIGHTINCHES: 72 in
IVS/LV PW RATIO, ED: 0.92
LA diam end sys: 42 mm
LA vol A4C: 64.8 ml
LADIAMINDEX: 2 cm/m2
LASIZE: 42 mm
LAVOL: 65.9 mL
LAVOLIN: 31.4 mL/m2
LDCA: 3.14 cm2
LV sys vol: 65 mL — AB (ref 21–61)
LVDIAVOL: 179 mL — AB (ref 62–150)
LVDIAVOLIN: 85 mL/m2
LVOT SV: 96 mL
LVOT VTI: 30.6 cm
LVOT diameter: 20 mm
LVOT peak VTI: 0.85 cm
LVOT peak grad rest: 15 mmHg
LVOTPV: 195 cm/s
LVSYSVOLIN: 31 mL/m2
Lateral S' vel: 18.9 cm/s
PW: 12 mm — AB (ref 0.6–1.1)
RV TAPSE: 29.1 mm
Simpson's disk: 64
TDI e' medial: 7.18
TR max vel: 287 cm/s
Valve area: 2.66 cm2
WEIGHTICAEL: 3040 [oz_av]

## 2016-04-22 LAB — CBC
HEMATOCRIT: 38.7 % — AB (ref 39.0–52.0)
HEMOGLOBIN: 12.4 g/dL — AB (ref 13.0–17.0)
MCH: 27.9 pg (ref 26.0–34.0)
MCHC: 32 g/dL (ref 30.0–36.0)
MCV: 87 fL (ref 78.0–100.0)
Platelets: 311 10*3/uL (ref 150–400)
RBC: 4.45 MIL/uL (ref 4.22–5.81)
RDW: 15.7 % — AB (ref 11.5–15.5)
WBC: 17.2 10*3/uL — AB (ref 4.0–10.5)

## 2016-04-22 LAB — BLOOD GAS, ARTERIAL
Acid-Base Excess: 5 mmol/L — ABNORMAL HIGH (ref 0.0–2.0)
Bicarbonate: 28.5 mmol/L — ABNORMAL HIGH (ref 20.0–28.0)
DELIVERY SYSTEMS: POSITIVE
Drawn by: 27407
Expiratory PAP: 5
FIO2: 50
INSPIRATORY PAP: 12
MODE: POSITIVE
O2 SAT: 96.6 %
PO2 ART: 86.7 mmHg (ref 83.0–108.0)
Patient temperature: 98.6
pCO2 arterial: 38.3 mmHg (ref 32.0–48.0)
pH, Arterial: 7.484 — ABNORMAL HIGH (ref 7.350–7.450)

## 2016-04-22 LAB — MRSA PCR SCREENING: MRSA by PCR: NEGATIVE

## 2016-04-22 LAB — BASIC METABOLIC PANEL
ANION GAP: 15 (ref 5–15)
ANION GAP: 12 (ref 5–15)
BUN: 64 mg/dL — AB (ref 6–20)
BUN: 35 mg/dL — ABNORMAL HIGH (ref 6–20)
CHLORIDE: 106 mmol/L (ref 101–111)
CHLORIDE: 101 mmol/L (ref 101–111)
CO2: 20 mmol/L — ABNORMAL LOW (ref 22–32)
CO2: 27 mmol/L (ref 22–32)
CREATININE: 11.63 mg/dL — AB (ref 0.61–1.24)
Calcium: 9.9 mg/dL (ref 8.9–10.3)
Calcium: 9.8 mg/dL (ref 8.9–10.3)
Creatinine, Ser: 16.56 mg/dL — ABNORMAL HIGH (ref 0.61–1.24)
GFR calc non Af Amer: 5 mL/min — ABNORMAL LOW (ref >60)
GFR, EST AFRICAN AMERICAN: 4 mL/min — AB (ref >60)
GFR, EST AFRICAN AMERICAN: 6 mL/min — AB (ref >60)
GFR, EST NON AFRICAN AMERICAN: 4 mL/min — AB (ref >60)
Glucose, Bld: 90 mg/dL (ref 65–99)
Glucose, Bld: 82 mg/dL (ref 65–99)
POTASSIUM: 5.7 mmol/L — AB (ref 3.5–5.1)
Potassium: 4.3 mmol/L (ref 3.5–5.1)
SODIUM: 141 mmol/L (ref 135–145)
SODIUM: 140 mmol/L (ref 135–145)

## 2016-04-22 LAB — I-STAT CHEM 8, ED
BUN: 92 mg/dL — AB (ref 6–20)
CHLORIDE: 113 mmol/L — AB (ref 101–111)
CREATININE: 18 mg/dL — AB (ref 0.61–1.24)
Calcium, Ion: 1.16 mmol/L (ref 1.15–1.40)
Glucose, Bld: 96 mg/dL (ref 65–99)
HEMATOCRIT: 41 % (ref 39.0–52.0)
Hemoglobin: 13.9 g/dL (ref 13.0–17.0)
POTASSIUM: 8 mmol/L — AB (ref 3.5–5.1)
Sodium: 141 mmol/L (ref 135–145)
TCO2: 22 mmol/L (ref 0–100)

## 2016-04-22 LAB — I-STAT TROPONIN, ED: Troponin i, poc: 0.05 ng/mL (ref 0.00–0.08)

## 2016-04-22 LAB — CREATININE, SERUM
CREATININE: 11.81 mg/dL — AB (ref 0.61–1.24)
GFR calc non Af Amer: 5 mL/min — ABNORMAL LOW (ref >60)
GFR, EST AFRICAN AMERICAN: 6 mL/min — AB (ref >60)

## 2016-04-22 LAB — LACTIC ACID, PLASMA: Lactic Acid, Venous: 1.5 mmol/L (ref 0.5–1.9)

## 2016-04-22 MED ORDER — HEPARIN SODIUM (PORCINE) 1000 UNIT/ML DIALYSIS: 1000.0000 [IU] | INTRAMUSCULAR | Status: DC | PRN

## 2016-04-22 MED ORDER — ALBUTEROL SULFATE (2.5 MG/3ML) 0.083% IN NEBU
5.0000 mg | INHALATION_SOLUTION | Freq: Once | RESPIRATORY_TRACT | Status: AC
  Administered 2016-04-22: 5 mg via RESPIRATORY_TRACT
  Filled 2016-04-22: qty 6

## 2016-04-22 MED ORDER — ALTEPLASE 2 MG IJ SOLR: 2.0000 mg | Freq: Once | INTRAMUSCULAR | Status: DC | PRN

## 2016-04-22 MED ORDER — PENTAFLUOROPROP-TETRAFLUOROETH EX AERO: 1.0000 "application " | INHALATION_SPRAY | CUTANEOUS | Status: DC | PRN

## 2016-04-22 MED ORDER — ONDANSETRON HCL 4 MG/2ML IJ SOLN
4.0000 mg | Freq: Four times a day (QID) | INTRAMUSCULAR | Status: DC | PRN
  Administered 2016-04-22: 4 mg via INTRAVENOUS
  Filled 2016-04-22: qty 2

## 2016-04-22 MED ORDER — FUROSEMIDE 20 MG PO TABS
20.0000 mg | ORAL_TABLET | Freq: Every day | ORAL | Status: DC
  Administered 2016-04-22 – 2016-04-24 (×3): 20 mg via ORAL
  Filled 2016-04-22 (×3): qty 1

## 2016-04-22 MED ORDER — SODIUM CHLORIDE 0.9 % IV SOLN: 100.0000 mL | INTRAVENOUS | Status: DC | PRN

## 2016-04-22 MED ORDER — DEXTROSE 5 % IV SOLN
2.0000 g | Freq: Once | INTRAVENOUS | Status: AC
  Administered 2016-04-22: 2 g via INTRAVENOUS
  Filled 2016-04-22: qty 2

## 2016-04-22 MED ORDER — DEXTROSE 5 % IV SOLN: 1.0000 g | Freq: Three times a day (TID) | INTRAVENOUS | Status: DC

## 2016-04-22 MED ORDER — HEPARIN SODIUM (PORCINE) 1000 UNIT/ML DIALYSIS: 4000.0000 [IU] | INTRAMUSCULAR | Status: DC | PRN

## 2016-04-22 MED ORDER — INSULIN ASPART 100 UNIT/ML ~~LOC~~ SOLN
10.0000 [IU] | Freq: Once | SUBCUTANEOUS | Status: AC
  Administered 2016-04-22: 10 [IU] via INTRAVENOUS
  Filled 2016-04-22: qty 1

## 2016-04-22 MED ORDER — LIDOCAINE-PRILOCAINE 2.5-2.5 % EX CREA: 1.0000 "application " | TOPICAL_CREAM | CUTANEOUS | Status: DC | PRN

## 2016-04-22 MED ORDER — DEXTROSE 50 % IV SOLN
1.0000 | Freq: Once | INTRAVENOUS | Status: AC
  Administered 2016-04-22: 50 mL via INTRAVENOUS
  Filled 2016-04-22: qty 50

## 2016-04-22 MED ORDER — LIDOCAINE HCL (PF) 1 % IJ SOLN: 5.0000 mL | INTRAMUSCULAR | Status: DC | PRN

## 2016-04-22 MED ORDER — FLUTICASONE PROPIONATE 50 MCG/ACT NA SUSP
1.0000 | Freq: Every day | NASAL | Status: DC
  Administered 2016-04-24: 1 via NASAL
  Filled 2016-04-22: qty 16

## 2016-04-22 MED ORDER — SODIUM CHLORIDE 0.9% FLUSH
3.0000 mL | Freq: Two times a day (BID) | INTRAVENOUS | Status: DC
  Administered 2016-04-22 – 2016-04-23 (×3): 3 mL via INTRAVENOUS

## 2016-04-22 MED ORDER — ACETAMINOPHEN 650 MG RE SUPP
650.0000 mg | Freq: Four times a day (QID) | RECTAL | Status: DC | PRN
  Filled 2016-04-22: qty 1

## 2016-04-22 MED ORDER — PANTOPRAZOLE SODIUM 40 MG PO TBEC
40.0000 mg | DELAYED_RELEASE_TABLET | Freq: Two times a day (BID) | ORAL | Status: DC
  Administered 2016-04-22 – 2016-04-24 (×4): 40 mg via ORAL
  Filled 2016-04-22 (×4): qty 1

## 2016-04-22 MED ORDER — ZOLPIDEM TARTRATE 5 MG PO TABS: 5.0000 mg | ORAL_TABLET | Freq: Every evening | ORAL | Status: DC | PRN

## 2016-04-22 MED ORDER — ONDANSETRON HCL 4 MG PO TABS
4.0000 mg | ORAL_TABLET | Freq: Four times a day (QID) | ORAL | Status: DC | PRN
  Filled 2016-04-22: qty 1

## 2016-04-22 MED ORDER — HEPARIN SODIUM (PORCINE) 5000 UNIT/ML IJ SOLN
5000.0000 [IU] | Freq: Three times a day (TID) | INTRAMUSCULAR | Status: DC
  Administered 2016-04-22: 5000 [IU] via SUBCUTANEOUS
  Filled 2016-04-22 (×2): qty 1

## 2016-04-22 MED ORDER — HEPARIN SODIUM (PORCINE) 1000 UNIT/ML DIALYSIS
100.0000 [IU]/kg | INTRAMUSCULAR | Status: DC | PRN
  Filled 2016-04-22: qty 9

## 2016-04-22 MED ORDER — VANCOMYCIN HCL 10 G IV SOLR
2000.0000 mg | INTRAVENOUS | Status: AC
  Administered 2016-04-22: 2000 mg via INTRAVENOUS
  Filled 2016-04-22: qty 2000

## 2016-04-22 MED ORDER — LOSARTAN POTASSIUM 50 MG PO TABS
100.0000 mg | ORAL_TABLET | Freq: Every day | ORAL | Status: DC
  Administered 2016-04-22 – 2016-04-24 (×3): 100 mg via ORAL
  Filled 2016-04-22 (×3): qty 2

## 2016-04-22 MED ORDER — HYDROCODONE-ACETAMINOPHEN 5-325 MG PO TABS: 1.0000 | ORAL_TABLET | ORAL | Status: DC | PRN

## 2016-04-22 MED ORDER — SODIUM CHLORIDE 0.9 % IV SOLN
1.0000 g | Freq: Once | INTRAVENOUS | Status: AC
  Administered 2016-04-22: 1 g via INTRAVENOUS
  Filled 2016-04-22: qty 10

## 2016-04-22 MED ORDER — AMLODIPINE BESYLATE 10 MG PO TABS
10.0000 mg | ORAL_TABLET | Freq: Every day | ORAL | Status: DC
  Administered 2016-04-22 – 2016-04-24 (×3): 10 mg via ORAL
  Filled 2016-04-22 (×3): qty 1

## 2016-04-22 MED ORDER — SODIUM BICARBONATE 8.4 % IV SOLN
50.0000 meq | Freq: Once | INTRAVENOUS | Status: AC
  Administered 2016-04-22: 50 meq via INTRAVENOUS
  Filled 2016-04-22: qty 50

## 2016-04-22 MED ORDER — CARVEDILOL 3.125 MG PO TABS
3.1250 mg | ORAL_TABLET | Freq: Two times a day (BID) | ORAL | Status: DC
  Administered 2016-04-22 – 2016-04-24 (×4): 3.125 mg via ORAL
  Filled 2016-04-22 (×7): qty 1

## 2016-04-22 MED ORDER — ISOSORBIDE MONONITRATE ER 60 MG PO TB24
60.0000 mg | ORAL_TABLET | Freq: Every day | ORAL | Status: DC
  Administered 2016-04-22 – 2016-04-24 (×3): 60 mg via ORAL
  Filled 2016-04-22 (×3): qty 1

## 2016-04-22 MED ORDER — ACETAMINOPHEN 325 MG PO TABS
650.0000 mg | ORAL_TABLET | Freq: Four times a day (QID) | ORAL | Status: DC | PRN
  Administered 2016-04-22: 650 mg via ORAL
  Filled 2016-04-22: qty 2

## 2016-04-22 NOTE — ED Triage Notes (Signed)
Per EMS pt called for SOB, he missed his dialysis on Saturday, last week he had dialysis on Tuesday, Wednesday and Thursday.  Been feeling "bad" since Saturday morning and SOB since 9pm.

## 2016-04-22 NOTE — Progress Notes (Signed)
RT placed patient on BIPAP per MD verbal order. Patient placed on 12/5 and 50%. Patient tolerating well at this time.

## 2016-04-22 NOTE — ED Notes (Signed)
Per MD request, RN contacted Respiratory for bipap.

## 2016-04-22 NOTE — Progress Notes (Signed)
Pt refused heparin sq. Pt stated " don't need it. I move around"  Pt educated.  Pt also refused SCD's .  Pt educated for those as well.  Pt more comfortable.  Will continue to monitor Karena AddisonCoro, Myron Lona T

## 2016-04-22 NOTE — Progress Notes (Signed)
Notified md.for medication for diarrhea. Pt having diarrhea/ nausea. Temp 101.  Aches. Tylenol , zofran given.  Placed on droplet r/o flu.  Will continue to monitor .Karena Addisonoro, Ignatius Kloos T

## 2016-04-22 NOTE — Consult Note (Signed)
[facility] Renal Consultation Note    Indication for Consultation:  Management of ESRD/hemodialysis; anemia, hypertension/volume and secondary hyperparathyroidism  HPI: Jeremy Huerta is a 20 y.o. male.  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  He was found to have acute respiratory failure with hypoxia, leukocytosis, tachycardia, hyperkalemia, and CXR concerning for HCAP. He normally receives HD in [place], however he missed his treatment on Saturday.  He is currently on BiPap and lethargic and difficult to obtain an accurate history.    Past Medical History:  Diagnosis Date  . Acute respiratory failure (HCC)   . ESRD (end stage renal disease) (HCC)   . FSGS (focal segmental glomerulosclerosis)   . Hypertension   . Renal disorder    on dialysis   Past Surgical History:  Procedure Laterality Date  . AV FISTULA PLACEMENT  12/2015  . RENAL BIOPSY  2013   Family History:   Family History  Problem Relation Age of Onset  . Hypertension Mother   . Diabetes Father   . Hypertension Father   . Kidney disease Father   . Hypertension Maternal Grandmother   . Diabetes Paternal Grandmother   . Hypertension Paternal Grandmother    Social History:  reports that he has never smoked. He has never used smokeless tobacco. He reports that he does not drink alcohol or use drugs. No Known Allergies Prior to Admission medications   Medication Sig Start Date End Date Taking? Authorizing Provider  amLODipine (NORVASC) 10 MG tablet Take 10 mg by mouth daily.  07/15/14  Yes Historical Provider, MD  carvedilol (COREG) 3.125 MG tablet Take 3.125 mg by mouth 2 (two) times daily. 06/23/14  Yes Historical Provider, MD  cycloSPORINE (SANDIMMUNE) 25 MG capsule Take 25 mg by mouth 4 (four) times daily. 07/08/14  Yes Historical Provider, MD  fluticasone (FLONASE) 50 MCG/ACT nasal spray Place 1 spray into both  nostrils daily. 04/13/16  Yes Historical Provider, MD  furosemide (LASIX) 20 MG tablet Take 20 mg by mouth daily. 07/08/14  Yes Historical Provider, MD  hydrALAZINE (APRESOLINE) 100 MG tablet Take 200 mg by mouth 3 (three) times daily. 04/13/16  Yes Historical Provider, MD  isosorbide mononitrate (IMDUR) 60 MG 24 hr tablet Take 60 mg by mouth daily. 12/31/15  Yes Historical Provider, MD  labetalol (NORMODYNE) 200 MG tablet Take 400 mg by mouth 3 (three) times daily. 03/16/16  Yes Historical Provider, MD  loperamide (IMODIUM A-D) 2 MG tablet Take 1 tablet (2 mg total) by mouth 4 (four) times daily as needed for diarrhea or loose stools. 02/13/16  Yes Vanetta Mulders, MD  losartan (COZAAR) 100 MG tablet Take 100 mg by mouth daily. 03/16/16  Yes Historical Provider, MD  ondansetron (ZOFRAN ODT) 4 MG disintegrating tablet Take 1 tablet (4 mg total) by mouth every 8 (eight) hours as needed for nausea or vomiting. 02/13/16  Yes Vanetta Mulders, MD  pantoprazole (PROTONIX) 40 MG tablet Take 40 mg by mouth 2 (two) times daily.   Yes Historical Provider, MD  RENAGEL 800 MG tablet Take 2,400 mg by mouth 3 (three) times daily.  12/05/15  Yes Historical Provider, MD   Current Facility-Administered Medications  Medication Dose Route Frequency Provider Last Rate Last Dose  . acetaminophen (TYLENOL) tablet 650 mg  650 mg Oral Q6H PRN Gwenyth Bender, NP       Or  . acetaminophen (TYLENOL) suppository 650 mg  650  mg Rectal Q6H PRN Gwenyth BenderKaren M Black, NP      . amLODipine (NORVASC) tablet 10 mg  10 mg Oral Daily Lesle ChrisKaren M Black, NP      . carvedilol (COREG) tablet 3.125 mg  3.125 mg Oral BID WC Lesle ChrisKaren M Black, NP      . ceFEPIme (MAXIPIME) 2 g in dextrose 5 % 50 mL IVPB  2 g Intravenous Once Lynita LombardJennifer D FostoriaDurham, Integr[facility] Bass PavilionRPH      . fluticasone (FLONASE) 50 MCG/ACT nasal spray 1 spray  1 spray Each Nare Daily Lesle ChrisKaren M Black, NP      . furosemide (LASIX) tablet 20 mg  20 mg Oral Daily Gwenyth BenderKaren M Black, NP      . heparin injection 5,000 Units   5,000 Units Subcutaneous Q8H Lesle ChrisKaren M Black, NP      . HYDROcodone-acetaminophen (NORCO/VICODIN) 5-325 MG per tablet 1-2 tablet  1-2 tablet Oral Q4H PRN Gwenyth BenderKaren M Black, NP      . isosorbide mononitrate (IMDUR) 24 hr tablet 60 mg  60 mg Oral Daily Lesle ChrisKaren M Black, NP      . losartan (COZAAR) tablet 100 mg  100 mg Oral Daily Gwenyth BenderKaren M Black, NP      . ondansetron [facility](ZOFRAN) tablet 4 mg  4 mg Oral Q6H PRN Gwenyth BenderKaren M Black, NP       Or  . ondansetron [facility](ZOFRAN) injection 4 mg  4 mg Intravenous Q6H PRN Gwenyth BenderKaren M Black, NP      . pantoprazole (PROTONIX) EC tablet 40 mg  40 mg Oral BID Lesle ChrisKaren M Black, NP      . sodium chloride flush (NS) 0.9 % injection 3 mL  3 mL Intravenous Q12H Lesle ChrisKaren M Black, NP      . vancomycin (VANCOCIN) 2,000 mg in sodium chloride 0.9 % 500 mL IVPB  2,000 mg Intravenous To Hemo Jennifer D [place], RPH      . zolpidem (AMBIEN) tablet 5 mg  5 mg Oral QHS PRN,MR X 1 Gwenyth BenderKaren M Black, NP       Labs: Basic Metabolic Panel:  Recent Labs Lab 04/22/16 0427 04/22/16 0513  NA 141 141  K 8.0* 5.7*  CL 113* 106  CO2  --  20*  GLUCOSE 96 90  BUN 92* 64*  CREATININE 18.00* 16.56*  CALCIUM  --  9.9   Liver Function Tests: No results for input(s): AST, ALT, ALKPHOS, BILITOT, PROT, ALBUMIN in the last 168 hours. No results for input(s): LIPASE, AMYLASE in the last 168 hours. No results for input(s): AMMONIA in the last 168 hours. CBC:  Recent Labs Lab 04/22/16 0417 04/22/16 0427  WBC 17.2*  --   HGB 12.4* 13.9  HCT 38.7* 41.0  MCV 87.0  --   PLT 311  --    Cardiac Enzymes: No results for input(s): CKTOTAL, CKMB, CKMBINDEX, TROPONINI in the last 168 hours. CBG: No results for input(s): GLUCAP in the last 168 hours. Iron Studies: No results for input(s): IRON, TIBC, TRANSFERRIN, FERRITIN in the last 72 hours. Studies/Results: Dg Chest Portable 1 View  Result Date: 04/22/2016 CLINICAL DATA:  Dyspnea tonight. EXAM: PORTABLE CHEST 1 VIEW COMPARISON:  02/13/2016 FINDINGS: Dense airspace  consolidation in the central portions of both lungs, as well as mild vascular fullness and indistinctness. This could represent alveolar edema. Infectious infiltrates are not excluded. Mild right lateral costophrenic angle blunting probably represents a small pleural effusion. IMPRESSION: Symmetric central airspace opacities and mild vascular fullness. This could represent alveolar edema but  infectious infiltrates are not excluded. Electronically Signed   By: Ellery Plunkaniel R Mitchell M.D.   On: 04/22/2016 04:53    ROS: Review of systems not obtained due to patient factors. Physical Exam: Vitals:   04/22/16 0705 04/22/16 0730 04/22/16 0800 04/22/16 0830  BP:  (!) 193/109 (!) 189/109 (!) 168/95  Pulse: (!) 119 (!) 115 (!) 112 (!) 111  Resp: (!) 41 (!) 38 (!) 36 (!) 32  Temp:      TempSrc:      SpO2: 97%     Weight:      Height:          Weight change:   Intake/Output Summary (Last 24 hours) at 04/22/16 [id number] Last data filed at 04/22/16 0631  Gross per 24 hour  Intake              100 ml  Output                0 ml  Net              100 ml   BP (!) 168/95   Pulse (!) 111   Temp 99.3 F (37.4 C) (Axillary)   Resp (!) 32   Ht 6' (1.829 m)   Wt 86.2 kg (190 lb)   SpO2 97%   BMI 25.77 kg/m  General appearance: fatigued, moderate distress and slowed mentation Head: Normocephalic, without obvious abnormality, atraumatic Resp: rales bilaterally Cardio: tachycardic no rub GI: soft, non-tender; bowel sounds normal; no masses,  no organomegaly Extremities: L AVF +T/B  Dialysis Access:  Dialysis Orders: [facility]  on [facility] . EDW 187lbs (85kg) HD Bath 2K/2.5Ca  Time 4 hours Heparin 3000 bolus, 500/hr. Access LAVF BFR 400 DFR 800    Hectoral 3 mcg IV/HD Aranesp 35mcg qweek     Assessment/Plan: 1.  Respiratory Failure - due to nonadherence with HD and possible HCAP (only 1kg above edw per outpatient HD units records).  Urgent HD with UF and wean BiPap as  able 2. Hyperkalemia - due to nonadherence with HD.  Improved  3.  ESRD -  Plan for urgent HD today and again tomorrow to get back on schedule 4.  Hypertension/volume  - as above will challenge edw  5.  Anemia  - hold aranesp due to Hgb >12 6.  Metabolic bone disease -  Continue with outpatient meds  7.  Nutrition - per primary  Irena CordsJoseph A. Raylyn Speckman, MD [facility], [facility] Pager [phone] 04/22/2016, 8:52 AM

## 2016-04-22 NOTE — ED Provider Notes (Signed)
[facility]-EMERGENCY DEPT Provider Note   CSN: [id number] Arrival date & time: 04/22/16  0411     History   Chief Complaint Chief Complaint  Patient presents with  . Shortness of Breath    HPI Jeremy Huerta is a 20 y.o. male.  HPI Patient has been on dialysis at [facility] over the past year.  He missed his last dialysis.  His last dialysis was now Thursday.  He presents with sudden shortness of breath that worsened this evening.  He was in town visiting friends here in [place] and came to the ER for severe shortness of breath.  He has had some cough today.  No fevers or chills.  Denies pain in his chest.  No abdominal or back pain.  Symptoms are moderate to severe in severity.  He states he feels very short of breath right now   Past Medical History:  Diagnosis Date  . Hypertension   . Renal disorder     There are no active problems to display for this patient.   History reviewed. No pertinent surgical history.     Home Medications    Prior to Admission medications   Medication Sig Start Date End Date Taking? Authorizing Provider  amLODipine (NORVASC) 10 MG tablet Take 10 mg by mouth daily.  07/15/14  Yes Historical Provider, MD  carvedilol (COREG) 3.125 MG tablet Take 3.125 mg by mouth 2 (two) times daily. 06/23/14  Yes Historical Provider, MD  cycloSPORINE (SANDIMMUNE) 25 MG capsule Take 25 mg by mouth 4 (four) times daily. 07/08/14  Yes Historical Provider, MD  fluticasone (FLONASE) 50 MCG/ACT nasal spray Place 1 spray into both nostrils daily. 04/13/16  Yes Historical Provider, MD  furosemide (LASIX) 20 MG tablet Take 20 mg by mouth daily. 07/08/14  Yes Historical Provider, MD  hydrALAZINE (APRESOLINE) 100 MG tablet Take 200 mg by mouth 3 (three) times daily. 04/13/16  Yes Historical Provider, MD  isosorbide mononitrate (IMDUR) 60 MG 24 hr tablet Take 60 mg by mouth daily. 12/31/15  Yes Historical Provider, MD  labetalol (NORMODYNE) 200 MG tablet Take 400 mg by mouth 3  (three) times daily. 03/16/16  Yes Historical Provider, MD  loperamide (IMODIUM A-D) 2 MG tablet Take 1 tablet (2 mg total) by mouth 4 (four) times daily as needed for diarrhea or loose stools. 02/13/16  Yes Vanetta Mulders, MD  losartan (COZAAR) 100 MG tablet Take 100 mg by mouth daily. 03/16/16  Yes Historical Provider, MD  ondansetron (ZOFRAN ODT) 4 MG disintegrating tablet Take 1 tablet (4 mg total) by mouth every 8 (eight) hours as needed for nausea or vomiting. 02/13/16  Yes Vanetta Mulders, MD  pantoprazole (PROTONIX) 40 MG tablet Take 40 mg by mouth 2 (two) times daily.   Yes Historical Provider, MD  RENAGEL 800 MG tablet Take 2,400 mg by mouth 3 (three) times daily.  12/05/15  Yes Historical Provider, MD  ondansetron (ZOFRAN ODT) 8 MG disintegrating tablet Take 1 tablet (8 mg total) by mouth every 8 (eight) hours as needed for nausea or vomiting. Patient not taking: Reported on 04/22/2016 07/20/14   Marisa Severin, MD    Family History No family history on file.  Social History Social History  Substance Use Topics  . Smoking status: Never Smoker  . Smokeless tobacco: Never Used  . Alcohol use No     Allergies   Patient has no known allergies.   Review of Systems Review of Systems  All other systems reviewed and are negative.  Physical Exam Updated Vital Signs BP (!) 184/108   Pulse 117   Temp 97.5 F (36.4 C) (Oral)   Resp 20   Ht 6' (1.829 m)   Wt 190 lb (86.2 kg)   SpO2 92%   BMI 25.77 kg/m   Physical Exam  Constitutional: He is oriented to person, place, and time. He appears well-developed and well-nourished.  HENT:  Head: Normocephalic and atraumatic.  Eyes: EOM are normal.  Neck: Normal range of motion.  Cardiovascular: Regular rhythm and normal heart sounds.   Tachycardic  Pulmonary/Chest: He is in respiratory distress. He has rales.  Tachypnea.  Mild accessory muscle use.  Abdominal: Soft. He exhibits no distension. There is no tenderness.    Musculoskeletal:  AV fistula in left forearm with good thrill  Neurological: He is alert and oriented to person, place, and time.  Skin: Skin is warm and dry.  Psychiatric: He has a normal mood and affect. Judgment normal.  Nursing note and vitals reviewed.    ED Treatments / Results  Labs (all labs ordered are listed, but only abnormal results are displayed) Labs Reviewed  CBC - Abnormal; Notable for the following:       Result Value   WBC 17.2 (*)    Hemoglobin 12.4 (*)    HCT 38.7 (*)    RDW 15.7 (*)    All other components within normal limits  BASIC METABOLIC PANEL - Abnormal; Notable for the following:    Potassium 5.7 (*)    CO2 20 (*)    BUN 64 (*)    Creatinine, Ser 16.56 (*)    GFR calc non Af Amer 4 (*)    GFR calc Af Amer 4 (*)    All other components within normal limits  I-STAT CHEM 8, ED - Abnormal; Notable for the following:    Potassium 8.0 (*)    Chloride 113 (*)    BUN 92 (*)    Creatinine, Ser 18.00 (*)    All other components within normal limits  CULTURE, BLOOD (ROUTINE X 2)  CULTURE, BLOOD (ROUTINE X 2)  CULTURE, EXPECTORATED SPUTUM-ASSESSMENT  GRAM STAIN  GASTROINTESTINAL PANEL BY PCR, STOOL (REPLACES STOOL CULTURE)  HIV ANTIBODY (ROUTINE TESTING)  STREP PNEUMONIAE URINARY ANTIGEN  CREATININE, SERUM  INFLUENZA PANEL BY PCR (TYPE A & B, H1N1)  BLOOD GAS, ARTERIAL  LACTIC ACID, PLASMA  LACTIC ACID, PLASMA  I-STAT TROPOININ, ED    EKG  EKG Interpretation  Date/Time:  Monday April 22 2016 04:14:56 EST Ventricular Rate:  112 PR Interval:    QRS Duration: 82 QT Interval:  286 QTC Calculation: 391 R Axis:   81 Text Interpretation:  Sinus tachycardia Probable left atrial enlargement Abnormal T, consider ischemia, lateral leads ST elev, probable normal early repol pattern No old tracing to compare Confirmed by Terrel Manalo  MD, Caryn BeeKEVIN ([id number]) on 04/22/2016 4:33:11 AM       Radiology Dg Chest Portable 1 View  Result Date:  04/22/2016 CLINICAL DATA:  Dyspnea tonight. EXAM: PORTABLE CHEST 1 VIEW COMPARISON:  02/13/2016 FINDINGS: Dense airspace consolidation in the central portions of both lungs, as well as mild vascular fullness and indistinctness. This could represent alveolar edema. Infectious infiltrates are not excluded. Mild right lateral costophrenic angle blunting probably represents a small pleural effusion. IMPRESSION: Symmetric central airspace opacities and mild vascular fullness. This could represent alveolar edema but infectious infiltrates are not excluded. Electronically Signed   By: Ellery Plunkaniel R Mitchell M.D.   On: 04/22/2016 04:53  Procedures .Critical Care Performed by: Azalia BilisAMPOS, Tykeria Wawrzyniak Authorized by: Azalia BilisAMPOS, Demetry Bendickson   Critical care provider statement:    Critical care time (minutes):  30   Critical care was necessary to treat or prevent imminent or life-threatening deterioration of the following conditions:  Respiratory failure   Critical care was time spent personally by me on the following activities:  Development of treatment plan with patient or surrogate, discussions with consultants, evaluation of patient's response to treatment, examination of patient, obtaining history from patient or surrogate, ordering and performing treatments and interventions, ordering and review of laboratory studies, ordering and review of radiographic studies, pulse oximetry, re-evaluation of patient's condition and review of old charts   (including critical care time)  Medications Ordered in ED Medications  albuterol (PROVENTIL) (2.5 MG/3ML) 0.083% nebulizer solution 5 mg (not administered)  sodium bicarbonate injection 50 mEq (not administered)  calcium gluconate 1 g in sodium chloride 0.9 % 100 mL IVPB (not administered)  insulin aspart (novoLOG) injection 10 Units (not administered)  dextrose 50 % solution 50 mL (not administered)     Initial Impression / Assessment and Plan / ED Course  I have reviewed the  triage vital signs and the nursing notes.  Pertinent labs & imaging results that were available during my care of the patient were reviewed by me and considered in my medical decision making (see chart for details).  Clinical Course     Patient with found hyperkalemia.  K of 8.  Patient given bicarbonate, calcium, dextrose, insulin.  Patient with moderate respiratory distress.  Patient will be initiated on BiPAP.  I suspect this is all edema.  Spoke with Dr Arlean HoppingSchertz of renal who will arrange emergent dialysis  Dr Julian ReilGardner hospitalist to admit.   Improvement on bipap, feel better. Tachypnea improving  Final Clinical Impressions(s) / ED Diagnoses   Final diagnoses:  Hypoxia  ESRD (end stage renal disease) (HCC)  Acute respiratory failure with hypoxia [facility](HCC)    New Prescriptions New Prescriptions   No medications on file     Azalia BilisKevin Cailie Bosshart, MD 04/22/16 [phone]

## 2016-04-22 NOTE — H&P (Signed)
History and Physical    Jeremy Huerta MVH:[id number] DOB: 08-21-1995 DOA: 04/22/2016  [place]  Chief Complaint: dyspnea  HPI: Jeremy Huerta is a pleasant 20 y.o. 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 Initial evaluation reveals acute respiratory failure with hypoxia, leukocytosis, tachycardia, hyperkalemia chest x-ray concerning for pneumonia.  Information is obtained from the patient. He states he is a Tuesday Thursday Saturday [facility] and he missed dialysis 2 days ago. [place] and developed gradual worsening shortness of breath. Associated symptoms include lower extremity edema, intermittent nausea with one episode of emesis and a slight worsening of his chronic diarrhea. He denies headache fever chills. He denies coffee ground emesis. He denies chest pain palpitation cough abdominal pain. He does report some intermittent chronic low back pain. He reports he makes "a little bit" of urine.    ED Course: In the emergency department he is febrile hypertensive tachycardia with tachypnea and obvious respiratory distress. He has a potassium level of 8. He is provided with bicarbonate, calcium, dextrose, insulin and placed on BiPAP. Dialysis is also initiated. At the time of admission he remains febrile tachycardia respiratory rate improved on BiPAP  Review of Systems: As per HPI otherwise 10 point review of systems negative.   Ambulatory Status: He ambulates independently and eyes any recent falls  Past Medical History:  Diagnosis Date  . Acute respiratory failure (HCC)   . ESRD (end stage renal disease) (HCC)   . FSGS (focal segmental glomerulosclerosis)   . Hypertension   .  Renal disorder    on dialysis    History reviewed. No pertinent surgical history.  Social History   Social History  . Marital status: Single    Spouse name: N/A  . Number of children: N/A  . Years of education: N/A   Occupational History  . Not on file.   Social History Main Topics  . Smoking status: Never Smoker  . Smokeless tobacco: Never Used  . Alcohol use No  . Drug use: No  . Sexual activity: Not on file   Other Topics Concern  . Not on file   Social History Narrative  . No narrative on file    No Known Allergies  Family History  Problem Relation Age of Onset  . Hypertension Mother     Prior to Admission medications   Medication Sig Start Date End Date Taking? Authorizing Provider  amLODipine (NORVASC) 10 MG tablet Take 10 mg by mouth daily.  07/15/14  Yes Historical Provider, MD  carvedilol (COREG) 3.125 MG tablet Take 3.125 mg by mouth 2 (two) times daily. 06/23/14  Yes Historical Provider, MD  cycloSPORINE (SANDIMMUNE) 25 MG capsule Take 25 mg by mouth 4 (four) times daily. 07/08/14  Yes Historical Provider, MD  fluticasone (FLONASE) 50 MCG/ACT nasal spray Place 1 spray into both nostrils daily. 04/13/16  Yes Historical Provider, MD  furosemide (LASIX) 20 MG tablet Take 20 mg by mouth daily. 07/08/14  Yes Historical Provider, MD  hydrALAZINE (APRESOLINE) 100 MG tablet Take 200 mg by mouth 3 (three) times daily. 04/13/16  Yes Historical Provider, MD  isosorbide mononitrate (IMDUR) 60 MG 24 hr tablet Take 60 mg by mouth daily. 12/31/15  Yes Historical Provider, MD  labetalol (NORMODYNE) 200 MG  tablet Take 400 mg by mouth 3 (three) times daily. 03/16/16  Yes Historical Provider, MD  loperamide (IMODIUM A-D) 2 MG tablet Take 1 tablet (2 mg total) by mouth 4 (four) times daily as needed for diarrhea or loose stools. 02/13/16  Yes Vanetta MuldersScott Zackowski, MD  losartan (COZAAR) 100 MG tablet Take 100 mg by mouth daily. 03/16/16  Yes Historical Provider, MD  ondansetron (ZOFRAN  ODT) 4 MG disintegrating tablet Take 1 tablet (4 mg total) by mouth every 8 (eight) hours as needed for nausea or vomiting. 02/13/16  Yes Vanetta MuldersScott Zackowski, MD  pantoprazole (PROTONIX) 40 MG tablet Take 40 mg by mouth 2 (two) times daily.   Yes Historical Provider, MD  RENAGEL 800 MG tablet Take 2,400 mg by mouth 3 (three) times daily.  12/05/15  Yes Historical Provider, MD    Physical Exam: Vitals:   04/22/16 0505 04/22/16 0600 04/22/16 0615 04/22/16 0647  BP:  (!) 199/115 (!) 196/114   Pulse: 112 118 117 118  Resp: (!) 34 (!) 40 (!) 44 (!) 27  Temp:    99.3 F (37.4 C)  TempSrc:    Axillary  SpO2: 94% 96% 91%   Weight:      Height:         General:  Appears Somewhat ill but not uncomfortable Eyes:  PERRL, EOMI, normal lids, iris ENT:  grossly normal hearing, lips & tongue, mucous membranes of his mouth are pink slightly dry Neck:  no LAD, masses or thyromegaly Cardiovascular:  Tachycardic but regular, HS distant no m/r/g. Trace LE edema. Pedal pulses present and palpable Respiratory:  On BiPAP moderate increased work of breathing with conversation breath sounds are quite distant with faint crackles bilaterally. Hear no wheezing Abdomen:  soft, ntnd, positive bowel sounds no guarding or rebounding Skin:  no rash or induration seen on limited exam Musculoskeletal:  grossly normal tone BUE/BLE, good ROM, no bony abnormality Psychiatric:  grossly normal mood and affect, speech fluent and appropriate, AOx3 Neurologic:  CN 2-12 grossly intact, moves all extremities in coordinated fashion, sensation intact  Labs on Admission: I have personally reviewed following labs and imaging studies  CBC:  Recent Labs Lab 04/22/16 0417 04/22/16 0427  WBC 17.2*  --   HGB 12.4* 13.9  HCT 38.7* 41.0  MCV 87.0  --   PLT 311  --    Basic Metabolic Panel:  Recent Labs Lab 04/22/16 0427 04/22/16 0513  NA 141 141  K 8.0* 5.7*  CL 113* 106  CO2  --  20*  GLUCOSE 96 90  BUN 92* 64*    CREATININE 18.00* 16.56*  CALCIUM  --  9.9   GFR: Estimated Creatinine Clearance: 7.7 mL/min (by C-G formula based on SCr of 16.56 mg/dL (H)). Liver Function Tests: No results for input(s): AST, ALT, ALKPHOS, BILITOT, PROT, ALBUMIN in the last 168 hours. No results for input(s): LIPASE, AMYLASE in the last 168 hours. No results for input(s): AMMONIA in the last 168 hours. Coagulation Profile: No results for input(s): INR, PROTIME in the last 168 hours. Cardiac Enzymes: No results for input(s): CKTOTAL, CKMB, CKMBINDEX, TROPONINI in the last 168 hours. BNP (last 3 results) No results for input(s): PROBNP in the last 8760 hours. HbA1C: No results for input(s): HGBA1C in the last 72 hours. CBG: No results for input(s): GLUCAP in the last 168 hours. Lipid Profile: No results for input(s): CHOL, HDL, LDLCALC, TRIG, CHOLHDL, LDLDIRECT in the last 72 hours. Thyroid Function Tests: No results for input(s):  TSH, T4TOTAL, FREET4, T3FREE, THYROIDAB in the last 72 hours. Anemia Panel: No results for input(s): VITAMINB12, FOLATE, FERRITIN, TIBC, IRON, RETICCTPCT in the last 72 hours. Urine analysis:    Component Value Date/Time   COLORURINE YELLOW 07/20/2014 0114   APPEARANCEUR CLOUDY (A) 07/20/2014 0114   LABSPEC 1.013 07/20/2014 0114   PHURINE 6.0 07/20/2014 0114   GLUCOSEU NEGATIVE 07/20/2014 0114   HGBUR SMALL (A) 07/20/2014 0114   BILIRUBINUR NEGATIVE 07/20/2014 0114   KETONESUR NEGATIVE 07/20/2014 0114   PROTEINUR >300 (A) 07/20/2014 0114   UROBILINOGEN 0.2 07/20/2014 0114   NITRITE NEGATIVE 07/20/2014 0114   LEUKOCYTESUR NEGATIVE 07/20/2014 0114    Creatinine Clearance: Estimated Creatinine Clearance: 7.7 mL/min (by C-G formula based on SCr of 16.56 mg/dL (H)).  Sepsis Labs: @LABRCNTIP (procalcitonin:4,lacticidven:4) )No results found for this or any previous visit (from the past 240 hour(s)).   Radiological Exams on Admission: Dg Chest Portable 1 View  Result Date:  04/22/2016 CLINICAL DATA:  Dyspnea tonight. EXAM: PORTABLE CHEST 1 VIEW COMPARISON:  02/13/2016 FINDINGS: Dense airspace consolidation in the central portions of both lungs, as well as mild vascular fullness and indistinctness. This could represent alveolar edema. Infectious infiltrates are not excluded. Mild right lateral costophrenic angle blunting probably represents a small pleural effusion. IMPRESSION: Symmetric central airspace opacities and mild vascular fullness. This could represent alveolar edema but infectious infiltrates are not excluded. Electronically Signed   By: Ellery Plunk M.D.   On: 04/22/2016 04:53    EKG: Independently reviewed.Sinus tachycardia Probable left atrial enlargement Abnormal T, consider ischemia, lateral leads ST elev, probable normal early repol pattern No old tracing to compare   Assessment/Plan Principal Problem:   Acute respiratory failure (HCC) Active Problems:   Hypertension   Diarrhea   HCAP (healthcare-associated pneumonia)   Tachycardia   ESRD (end stage renal disease) (HCC)   #1. Acute respiratory failure with hypoxia likely related to edema secondary to missing dialysis in the setting of possible pneumonia. On presentation patient quite tachypneic chest x-ray reveals edema and unable to rule out infiltrate. He is febrile with leukocytosis. Emergent dialysis initiated -Admit to step down -Continue BiPAP -Obtain an ABG -Obtain echocardiogram for completeness -Obtain blood cultures -antibiotics per protocol -Obtain influenza panel -Sputum cultures is able -Strep pneumo urine antigen is able -Wean BiPAP as able -Appreciate nephrology's assistance. May need several days of dialysis  #2. Healthcare associated pneumonia. Patient febrile, leukocytosis, chest x-ray unable to rule out infiltrate. -Antibiotics per protocol -Blood cultures -Sputum cultures as able -Supportive therapy  #3. End-stage renal disease on dialysis Tuesdays Thursdays  and Saturdays in [place] history of FSGS. Patient reports missing dialysis 2 days ago -Dialysis per nephrology -continue home med  #4. Hypertension. Uncontrolled in the emergency department. Home medications include amlodipine, Coreg, Lasix, hydralazine, labetalol, losartan. -We will continue Coreg, Lasix, Imdur, losartan -Monitor closely and resume other meds as indicated  #5. Nausea/vomiting/diarrhea. Likely related to above. Patient complains of intermittent chronic diarrhea. Only one episode of emesis but ongoing nausea. -Influenza panel -GI panel -Supportive therapy     DVT prophylaxis: heparin Code Status: full  Family Communication: mother on phone [phone]  Disposition Plan: home when ready Consults called: [facility]  Admission status: inpatient    Gwenyth Bender MD [facility]  If 7PM-7AM, please contact night-coverage [email or web address] Password [email or web address]  04/22/2016, 7:56 AM

## 2016-04-22 NOTE — Progress Notes (Signed)
RT transported [facility] without any complications.

## 2016-04-22 NOTE — Progress Notes (Signed)
Patient was transported from dialysis to room 4NP09 without any apparent complications. RT placed pt on a 2L [place] after transport.

## 2016-04-22 NOTE — Progress Notes (Addendum)
Pharmacy Antibiotic Note  Jeremy Huerta is a 20 y.o. male admitted on 04/22/2016 with pneumonia.  Pharmacy has been consulted for vancomycin dosing. He has ESRD and receives HD on TTS. Cefepime also ordered. Planned LOT is 8 days for both antibiotics.  Plan: Vancomycin 2000 mg IV once  Cefepime 2 g IV once HD off schedule - will f/u HD plan and order subsequent doses  Height: 6' (182.9 cm) Weight: 190 lb (86.2 kg) IBW/kg (Calculated) : 77.6  Temp (24hrs), Avg:98.4 F (36.9 C), Min:97.5 F (36.4 C), Max:99.3 F (37.4 C)   Recent Labs Lab 04/22/16 0417 04/22/16 0427 04/22/16 0513  WBC 17.2*  --   --   CREATININE  --  18.00* 16.56*    Estimated Creatinine Clearance: 7.7 mL/min (by C-G formula based on SCr of 16.56 mg/dL (H)).    No Known Allergies  Antimicrobials this admission: Vanc 12/4 >>  Cefepime 12/4 >>   Dose adjustments this admission:   Microbiology results: 12/4 BCx:  12/4 Sputum:   12/4 Influenza panel: 12/4 GI panel:  Thank you for allowing pharmacy to be a part of this patient's care.  Loura BackJennifer [place], PharmD, BCPS Clinical Pharmacist Phone for today [phone] Main pharmacy - [phone] 04/22/2016 8:06 AM

## 2016-04-22 NOTE — Procedures (Signed)
Patient was seen on dialysis and the procedure was supervised. BFR 400 Via LAVF BP is 149/87.  Patient appears to be tolerating treatment well but is in respiratory distress on BiPap.

## 2016-04-22 NOTE — Progress Notes (Signed)
Echocardiogram 2D Echocardiogram has been performed.  Jeremy Huerta 04/22/2016, 2:49 PM

## 2016-04-23 LAB — COMPREHENSIVE METABOLIC PANEL
ALBUMIN: 3.3 g/dL — AB (ref 3.5–5.0)
ALK PHOS: 58 U/L (ref 38–126)
ALT: 16 U/L — ABNORMAL LOW (ref 17–63)
ANION GAP: 13 (ref 5–15)
AST: 13 U/L — ABNORMAL LOW (ref 15–41)
BILIRUBIN TOTAL: 1 mg/dL (ref 0.3–1.2)
BUN: 51 mg/dL — AB (ref 6–20)
CALCIUM: 9.2 mg/dL (ref 8.9–10.3)
CO2: 27 mmol/L (ref 22–32)
Chloride: 98 mmol/L — ABNORMAL LOW (ref 101–111)
Creatinine, Ser: 13.63 mg/dL — ABNORMAL HIGH (ref 0.61–1.24)
GFR calc Af Amer: 5 mL/min — ABNORMAL LOW (ref >60)
GFR, EST NON AFRICAN AMERICAN: 4 mL/min — AB (ref >60)
GLUCOSE: 98 mg/dL (ref 65–99)
Potassium: 4.3 mmol/L (ref 3.5–5.1)
Sodium: 138 mmol/L (ref 135–145)
TOTAL PROTEIN: 6.2 g/dL — AB (ref 6.5–8.1)

## 2016-04-23 LAB — POCT I-STAT, CHEM 8
BUN: 92 mg/dL — ABNORMAL HIGH (ref 6–20)
CALCIUM ION: 1.16 mmol/L (ref 1.15–1.40)
Chloride: 113 mmol/L — ABNORMAL HIGH (ref 101–111)
Creatinine, Ser: 18 mg/dL — ABNORMAL HIGH (ref 0.61–1.24)
Glucose, Bld: 96 mg/dL (ref 65–99)
HEMATOCRIT: 41 % (ref 39.0–52.0)
HEMOGLOBIN: 13.9 g/dL (ref 13.0–17.0)
Potassium: 8 mmol/L (ref 3.5–5.1)
SODIUM: 141 mmol/L (ref 135–145)
TCO2: 22 mmol/L (ref 0–100)

## 2016-04-23 LAB — CBC
HCT: 35.6 % — ABNORMAL LOW (ref 39.0–52.0)
Hemoglobin: 11.2 g/dL — ABNORMAL LOW (ref 13.0–17.0)
MCH: 27.4 pg (ref 26.0–34.0)
MCHC: 31.5 g/dL (ref 30.0–36.0)
MCV: 87 fL (ref 78.0–100.0)
Platelets: 247 10*3/uL (ref 150–400)
RBC: 4.09 MIL/uL — ABNORMAL LOW (ref 4.22–5.81)
RDW: 15.2 % (ref 11.5–15.5)
WBC: 13 10*3/uL — AB (ref 4.0–10.5)

## 2016-04-23 LAB — INFLUENZA PANEL BY PCR (TYPE A & B)
INFLAPCR: NEGATIVE
INFLBPCR: NEGATIVE

## 2016-04-23 LAB — HIV ANTIBODY (ROUTINE TESTING W REFLEX): HIV Screen 4th Generation wRfx: NONREACTIVE

## 2016-04-23 MED ORDER — CEFEPIME HCL 1 G IJ SOLR
1.0000 g | Freq: Three times a day (TID) | INTRAMUSCULAR | Status: DC
  Filled 2016-04-23 (×3): qty 1

## 2016-04-23 MED ORDER — VANCOMYCIN HCL IN DEXTROSE 1-5 GM/200ML-% IV SOLN
1000.0000 mg | Freq: Once | INTRAVENOUS | Status: AC
Start: 2016-04-23 — End: 2016-04-23
  Administered 2016-04-23: 1000 mg via INTRAVENOUS
  Filled 2016-04-23: qty 200

## 2016-04-23 MED ORDER — DEXTROSE 5 % IV SOLN
2.0000 g | INTRAVENOUS | Status: DC
  Administered 2016-04-23: 2 g via INTRAVENOUS
  Filled 2016-04-23 (×2): qty 2

## 2016-04-23 MED ORDER — VANCOMYCIN HCL IN DEXTROSE 1-5 GM/200ML-% IV SOLN: 1000.0000 mg | INTRAVENOUS | Status: DC

## 2016-04-23 NOTE — Procedures (Signed)
I was present at this dialysis session. I have reviewed the session itself and made appropriate changes.  He feels much better this am and is not requiring BiPap.  Plan to be discharged tomorrow if he continues to improve.  He is below his EDW and will re-evaluate after HD today as his BP is elevated and goal UF of 4 liters today.  Filed Weights   04/22/16 0418 04/23/16 0349 04/23/16 0820  Weight: 86.2 kg (190 lb) 85.7 kg (188 lb 14.4 oz) 86.9 kg (191 lb 9.3 oz)     Recent Labs Lab 04/23/16 0228  NA 138  K 4.3  CL 98*  CO2 27  GLUCOSE 98  BUN 51*  CREATININE 13.63*  CALCIUM 9.2     Recent Labs Lab 04/22/16 0417 04/22/16 0427 04/23/16 0228  WBC 17.2*  --  13.0*  HGB 12.4* 13.9 11.2*  HCT 38.7* 41.0 35.6*  MCV 87.0  --  87.0  PLT 311  --  247    Scheduled Meds: . amLODipine  10 mg Oral Daily  . carvedilol  3.125 mg Oral BID WC  . ceFEPime (MAXIPIME) IV  1 g Intravenous Q8H  . fluticasone  1 spray Each Nare Daily  . furosemide  20 mg Oral Daily  . heparin  5,000 Units Subcutaneous Q8H  . isosorbide mononitrate  60 mg Oral Daily  . losartan  100 mg Oral Daily  . pantoprazole  40 mg Oral BID  . sodium chloride flush  3 mL Intravenous Q12H   Continuous Infusions: PRN Meds:.acetaminophen **OR** acetaminophen, HYDROcodone-acetaminophen, ondansetron **OR** ondansetron (ZOFRAN) IV, zolpidem   Irena CordsJoseph A Geneal Huebert,  MD 04/23/2016, 9:24 AM

## 2016-04-23 NOTE — Progress Notes (Signed)
Patient currently on 4LNC with sats of 99%. All vitals are stable and patient is in no distress. BIPAP is not needed at this time.

## 2016-04-23 NOTE — Progress Notes (Signed)
Refused bed alarm x 2.

## 2016-04-23 NOTE — Progress Notes (Signed)
Pharmacy Antibiotic Note  Jeremy Huerta is a 20 y.o. male admitted on 04/22/2016 with pneumonia.  Pharmacy has been consulted for vancomycin dosing. Cefepime also ordered.   He has ESRD and receives HD on [facility]. Went for emergent HD on 12/4, and a repeat session today. Received vancomycin 2g and cefepime 2g after HD yesterday. Planned LOT is 8 days for both antibiotics- noted chance patient may discharge tomorrow on PO abx  Plan: Vancomycin 1g IV qTTSat-today's on floor and subsequent doses scheduled to be given in HD Cefepime 2g IV qTTSat @ 1800 Follow clinical progression, HD schedule/tolerance  Height: 6' (182.9 cm) Weight: 182 lb 1.6 oz (82.6 kg) IBW/kg (Calculated) : 77.6  Temp (24hrs), Avg:99.2 F (37.3 C), Min:98.5 F (36.9 C), Max:101 F (38.3 C)   Recent Labs Lab 04/22/16 0417 04/22/16 0427 04/22/16 0513 04/22/16 1407 04/23/16 0228  WBC 17.2*  --   --   --  13.0*  CREATININE  --  18.00*  18.00* 16.56* 11.63*  11.81* 13.63*  LATICACIDVEN  --   --   --  1.5  --     Estimated Creatinine Clearance: 9.3 mL/min (by C-G formula based on SCr of 13.63 mg/dL (H)).    No Known Allergies  Antimicrobials this admission:  Vanc 12/4 >>  *given 2g after HD on 12/4, 1g after HD on 12/5 Cefepime 12/4 >>   Dose adjustments this admission:  n/a  Microbiology results:  12/4 BCx: sent Sputum: sent GI panel by PCR: sent  12/4 MRSA PCR: neg  Thank you for allowing pharmacy to be a part of this patient's care.  Cason Dabney D. Shandora Koogler, PharmD, BCPS Clinical Pharmacist Pager: [phone] 04/23/2016 1:33 PM

## 2016-04-23 NOTE — Progress Notes (Signed)
PROGRESS NOTE  Karin GoldenDeanthony Feazell UEA:[id number] DOB: 11-11-1995 DOA: 04/22/2016 PCP: No PCP Per Patient   LOS: 1 day   Brief Narrative: 20 y.o. male who presented 12/4 with history respiratory failure with hypoxia secondary to combination of fluid overload from ESRD and likely HCAP.  Assessment & Plan: Principal Problem:   Acute respiratory failure (HCC) Active Problems:   Hypertension   Diarrhea   HCAP (healthcare-associated pneumonia)   Tachycardia   ESRD (end stage renal disease) (HCC)   Essential hypertension   Acute respiratory failure with hypoxia likely related to edema secondary to missing dialysis in the setting of possible pneumonia.  - Patient underwent hemodialysis on 12/4 as well as repeat hemodialysis on 12/5, wean off oxygen as tolerated, if he improves anticipate discharge 12/6 - nephrology following - 2-D echo done yesterday showed normal ejection fraction  Healthcare associated pneumonia - Patient febrile on admission, with leukocytosis, chest x-ray unable to rule out infiltrate - He also had a cough with purulent sputum production at home - Continue IV antibiotics for today, if cultures remain negative at 48 hours can be transitioned to by mouth  End-stage renal disease  - [place] of [place]. Patient reports missing dialysis 2 days prior to admission - Dialysis per nephrology, another session today  Hypertension  - Continue current regimen with Norvasc, Coreg, Lasix, Imdur and Cozaar - BP in the 160s this morning. About to have HD  Nausea/vomiting/diarrhea - Likely related to above. Patient complains of intermittent chronic diarrhea. Only one episode of emesis but ongoing nausea. - improved   DVT prophylaxis: heparin Code Status: Full Family Communication: no family bedside Disposition Plan: home 1 day  Consultants:   Nephrology   Procedures:   2D echo Study Conclusions - Left  ventricle: The cavity size was normal. Wall thickness was increased in a pattern of mild LVH. Systolic function was normal. The estimated ejection fraction was in the range of 60% to 65%. Wall motion was normal; there were no regional wall motion abnormalities. Doppler parameters are consistent with abnormal left ventricular relaxation (grade 1 diastolic dysfunction). - Aortic valve: There was trivial regurgitation. Valve area (VTI): 2.66 cm^2. Valve area (Vmax): 2.61 cm^2. Valve area (Vmean): 2.67 cm^2. - Pulmonary arteries: Systolic pressure was mildly increased. PA peak pressure: 36 mm Hg (S). - Pericardium, extracardiac: A trivial pericardial effusion was identified. There was no evidence of hemodynamic compromise.  HD: 12/4 and 12/5  Antimicrobials:  Vancomycin / Cefepime 12/4 >>   Subjective: - no chest pain, shortness of breath, no abdominal pain, nausea or vomiting.   Objective: Vitals:   04/23/16 1015 04/23/16 1030 04/23/16 1045 04/23/16 1100  BP: (!) 172/100 (!) 165/104 (!) 172/97 (!) 166/97  Pulse: 96 93 92 92  Resp:      Temp:      TempSrc:      SpO2:      Weight:      Height:        Intake/Output Summary (Last 24 hours) at 04/23/16 1114 Last data filed at 04/23/16 0349  Gross per 24 hour  Intake              530 ml  Output                0 ml  Net              530 ml   Filed Weights   04/22/16 0418 04/23/16 0349 04/23/16  0820  Weight: 86.2 kg (190 lb) 85.7 kg (188 lb 14.4 oz) 86.9 kg (191 lb 9.3 oz)    Examination: Constitutional: NAD Vitals:   04/23/16 1015 04/23/16 1030 04/23/16 1045 04/23/16 1100  BP: (!) 172/100 (!) 165/104 (!) 172/97 (!) 166/97  Pulse: 96 93 92 92  Resp:      Temp:      TempSrc:      SpO2:      Weight:      Height:       Eyes: lids and conjunctivae normal Respiratory: clear to auscultation bilaterally, no wheezing, no crackles.  Cardiovascular: Regular rate and rhythm, no murmurs / rubs / gallops.  Abdomen: no tenderness. Bowel  sounds positive.  Musculoskeletal: no clubbing / cyanosis.  Skin: no rashes, lesions, ulcers. No induration Neurologic: non focal     Data Reviewed: I have personally reviewed following labs and imaging studies  CBC:  Recent Labs Lab 04/22/16 0417 04/22/16 0427 04/23/16 0228  WBC 17.2*  --  13.0*  HGB 12.4* 13.9  13.9 11.2*  HCT 38.7* 41.0  41.0 35.6*  MCV 87.0  --  87.0  PLT 311  --  247   Basic Metabolic Panel:  Recent Labs Lab 04/22/16 0427 04/22/16 0513 04/22/16 1407 04/23/16 0228  NA 141  141 141 140 138  K 8.0*  8.0* 5.7* 4.3 4.3  CL 113*  113* 106 101 98*  CO2  --  20* 27 27  GLUCOSE 96  96 90 82 98  BUN 92*  92* 64* 35* 51*  CREATININE 18.00*  18.00* 16.56* 11.63*  11.81* 13.63*  CALCIUM  --  9.9 9.8 9.2   GFR: Estimated Creatinine Clearance: 9.3 mL/min (by C-G formula based on SCr of 13.63 mg/dL (H)). Liver Function Tests:  Recent Labs Lab 04/23/16 0228  AST 13*  ALT 16*  ALKPHOS 58  BILITOT 1.0  PROT 6.2*  ALBUMIN 3.3*   No results for input(s): LIPASE, AMYLASE in the last 168 hours. No results for input(s): AMMONIA in the last 168 hours. Coagulation Profile: No results for input(s): INR, PROTIME in the last 168 hours. Cardiac Enzymes: No results for input(s): CKTOTAL, CKMB, CKMBINDEX, TROPONINI in the last 168 hours. BNP (last 3 results) No results for input(s): PROBNP in the last 8760 hours. HbA1C: No results for input(s): HGBA1C in the last 72 hours. CBG: No results for input(s): GLUCAP in the last 168 hours. Lipid Profile: No results for input(s): CHOL, HDL, LDLCALC, TRIG, CHOLHDL, LDLDIRECT in the last 72 hours. Thyroid Function Tests: No results for input(s): TSH, T4TOTAL, FREET4, T3FREE, THYROIDAB in the last 72 hours. Anemia Panel: No results for input(s): VITAMINB12, FOLATE, FERRITIN, TIBC, IRON, RETICCTPCT in the last 72 hours. Urine analysis:    Component Value Date/Time   COLORURINE YELLOW 07/20/2014 0114    APPEARANCEUR CLOUDY (A) 07/20/2014 0114   LABSPEC 1.013 07/20/2014 0114   PHURINE 6.0 07/20/2014 0114   GLUCOSEU NEGATIVE 07/20/2014 0114   HGBUR SMALL (A) 07/20/2014 0114   BILIRUBINUR NEGATIVE 07/20/2014 0114   KETONESUR NEGATIVE 07/20/2014 0114   PROTEINUR >300 (A) 07/20/2014 0114   UROBILINOGEN 0.2 07/20/2014 0114   NITRITE NEGATIVE 07/20/2014 0114   LEUKOCYTESUR NEGATIVE 07/20/2014 0114   Sepsis Labs: Invalid input(s): PROCALCITONIN, LACTICIDVEN  Recent Results (from the past 240 hour(s))  MRSA PCR Screening     Status: None   Collection Time: 04/22/16  2:06 PM  Result Value Ref Range Status   MRSA by PCR NEGATIVE NEGATIVE  Final    Comment:        The GeneXpert MRSA Assay (FDA approved for NASAL specimens only), is one component of a comprehensive MRSA colonization surveillance program. It is not intended to diagnose MRSA infection nor to guide or monitor treatment for MRSA infections.       Radiology Studies: Dg Chest Portable 1 View  Result Date: 04/22/2016 CLINICAL DATA:  Dyspnea tonight. EXAM: PORTABLE CHEST 1 VIEW COMPARISON:  02/13/2016 FINDINGS: Dense airspace consolidation in the central portions of both lungs, as well as mild vascular fullness and indistinctness. This could represent alveolar edema. Infectious infiltrates are not excluded. Mild right lateral costophrenic angle blunting probably represents a small pleural effusion. IMPRESSION: Symmetric central airspace opacities and mild vascular fullness. This could represent alveolar edema but infectious infiltrates are not excluded. Electronically Signed   By: Ellery Plunk M.D.   On: 04/22/2016 04:53     Scheduled Meds: . amLODipine  10 mg Oral Daily  . carvedilol  3.125 mg Oral BID WC  . ceFEPime (MAXIPIME) IV  1 g Intravenous Q8H  . fluticasone  1 spray Each Nare Daily  . furosemide  20 mg Oral Daily  . heparin  5,000 Units Subcutaneous Q8H  . isosorbide mononitrate  60 mg Oral Daily  .  losartan  100 mg Oral Daily  . pantoprazole  40 mg Oral BID  . sodium chloride flush  3 mL Intravenous Q12H   Continuous Infusions:   Pamella Pert, MD, PhD [facility] Pager [phone]  If 7PM-7AM, please contact night-coverage [email or web address] Password [email or web address] 04/23/2016, 11:14 AM

## 2016-04-24 DIAGNOSIS — J9601 Acute respiratory failure with hypoxia: Secondary | ICD-10-CM

## 2016-04-24 LAB — BASIC METABOLIC PANEL
Anion gap: 14 (ref 5–15)
BUN: 40 mg/dL — AB (ref 6–20)
CHLORIDE: 98 mmol/L — AB (ref 101–111)
CO2: 26 mmol/L (ref 22–32)
CREATININE: 10.99 mg/dL — AB (ref 0.61–1.24)
Calcium: 9.6 mg/dL (ref 8.9–10.3)
GFR calc Af Amer: 7 mL/min — ABNORMAL LOW (ref >60)
GFR calc non Af Amer: 6 mL/min — ABNORMAL LOW (ref >60)
Glucose, Bld: 87 mg/dL (ref 65–99)
Potassium: 4.1 mmol/L (ref 3.5–5.1)
Sodium: 138 mmol/L (ref 135–145)

## 2016-04-24 LAB — CBC
HEMATOCRIT: 37.4 % — AB (ref 39.0–52.0)
HEMOGLOBIN: 12.1 g/dL — AB (ref 13.0–17.0)
MCH: 27.6 pg (ref 26.0–34.0)
MCHC: 32.4 g/dL (ref 30.0–36.0)
MCV: 85.4 fL (ref 78.0–100.0)
Platelets: 250 10*3/uL (ref 150–400)
RBC: 4.38 MIL/uL (ref 4.22–5.81)
RDW: 14.5 % (ref 11.5–15.5)
WBC: 7.9 10*3/uL (ref 4.0–10.5)

## 2016-04-24 MED ORDER — SACCHAROMYCES BOULARDII 250 MG PO CAPS: 250.0000 mg | ORAL_CAPSULE | Freq: Two times a day (BID) | ORAL | Status: DC

## 2016-04-24 MED ORDER — DEXTROSE 5 % IV SOLN: 2.0000 g | INTRAVENOUS | 0 refills | Status: DC

## 2016-04-24 MED ORDER — SACCHAROMYCES BOULARDII 250 MG PO CAPS: 250.0000 mg | ORAL_CAPSULE | Freq: Two times a day (BID) | ORAL | 0 refills | Status: DC

## 2016-04-24 MED ORDER — LEVOFLOXACIN 750 MG PO TABS: 750.0000 mg | ORAL_TABLET | ORAL | 0 refills | Status: DC

## 2016-04-24 NOTE — Discharge Summary (Signed)
[facility] Discharge Summary   Patient: Jeremy Huerta ZOX:[id number]   PCP: [name], MD DOB: 1995/06/27   Date of admission: 04/22/2016   Date of discharge: 04/24/2016    Discharge Diagnoses:  Principal Problem:   Acute respiratory failure (HCC) Active Problems:   Hypertension   Diarrhea   HCAP (healthcare-associated pneumonia)   Tachycardia   ESRD (end stage renal disease) (HCC)   Essential hypertension   Admitted From: Home Disposition:  Home  Recommendations for Outpatient Follow-up:  1. Follow-up with PCP in one week, follow-up with nephrology in 2 weeks.   Follow-up Information    [name], MD. Schedule an appointment as soon as possible for a visit in 1 month.   Specialty:  Nephrology Why:  Appointment Date: 05/07/2016 @3 :81XB  Contact information: [address] [place] [address] [phone]          Diet recommendation: Renal diet  Activity: The patient is advised to gradually reintroduce usual activities.  Discharge Condition: good  Code Status: Full code  History of present illness: As per the H and P dictated on admission, "Pattrick Bady is a pleasant 20 y.o. 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 Initial evaluation reveals acute respiratory failure with hypoxia, leukocytosis, tachycardia, hyperkalemia chest x-ray concerning for pneumonia.  Information is obtained from the patient. He states he is a Tuesday Thursday Saturday dialysis patient at [facility] and he missed dialysis 2 days ago. Came to [facility] to visit cousins and developed gradual worsening shortness of breath. Associated symptoms include lower extremity edema, intermittent nausea with one episode of emesis and a slight worsening of his chronic  diarrhea. He denies headache fever chills. He denies coffee ground emesis. He denies chest pain palpitation cough abdominal pain. He does report some intermittent chronic low back pain. He reports he makes "a little bit" of urine."  Hospital Course:   Summary of his active problems in the hospital is as following. Acute respiratory failure with hypoxia likely related to edema secondary to missing dialysis in the setting of possible pneumonia.  - Patient underwent hemodialysis on 12/4, 12/5, weaned off oxygen - 2-D echo done showed normal ejection fraction  Suspected Healthcare associated pneumonia, ruled out - Patient mildly febrile on admission, with leukocytosis, chest x-ray unable to rule out infiltrate - He also had a cough with purulent sputum production at home  cultures remain negative at 48 hours, leucocytosis resolves rapidly  No evidence of active infection, we will discontinue antibiotics  End-stage renal disease  - on dialysis Tuesdays Thursdays and Saturdays in [place] history of FSGS. Patient reports missing dialysis 2 days prior to admission - Dialysis per nephrology  Hypertension  - Continue current regimen with Norvasc, labetalol, Imdur and Cozaar  Nausea/vomiting/diarrhea - Likely related to above. Patient complains of intermittent chronic diarrhea. Only one episode of emesis but ongoing nausea. - improved probiotics  All other chronic medical condition were stable during the hospitalization.  Patient was ambulatory without any assistance. On the day of the discharge the patient's vitals stable, and no other acute medical condition were reported by patient. the patient was felt safe to be discharge at home with family.  Procedures and Results:  Hemodialysis   Consultations:  Nephrology  DISCHARGE MEDICATION: Discharge Medication List as of 04/24/2016  1:46 PM    START taking these  medications   Details  saccharomyces boulardii (FLORASTOR) 250 MG  capsule Take 1 capsule (250 mg total) by mouth 2 (two) times daily., Starting Wed 04/24/2016, Normal      CONTINUE these medications which have NOT CHANGED   Details  amLODipine (NORVASC) 10 MG tablet Take 10 mg by mouth daily. , Starting Fri 07/15/2014, Historical Med    fluticasone (FLONASE) 50 MCG/ACT nasal spray Place 1 spray into both nostrils daily., Starting Sat 04/13/2016, Historical Med    hydrALAZINE (APRESOLINE) 100 MG tablet Take 100 mg by mouth 3 (three) times daily. , Starting Sat 04/13/2016, Historical Med    isosorbide mononitrate (IMDUR) 60 MG 24 hr tablet Take 60 mg by mouth daily., Starting Sun 12/31/2015, Historical Med    labetalol (NORMODYNE) 200 MG tablet Take 400 mg by mouth 3 (three) times daily., Starting Sat 03/16/2016, Historical Med    loperamide (IMODIUM A-D) 2 MG tablet Take 1 tablet (2 mg total) by mouth 4 (four) times daily as needed for diarrhea or loose stools., Starting Tue 02/13/2016, Print    losartan (COZAAR) 100 MG tablet Take 100 mg by mouth daily., Starting Sat 03/16/2016, Historical Med    ondansetron (ZOFRAN ODT) 4 MG disintegrating tablet Take 1 tablet (4 mg total) by mouth every 8 (eight) hours as needed for nausea or vomiting., Starting Tue 02/13/2016, Print    ranitidine (ZANTAC) 150 MG capsule Take 150 mg by mouth daily., Historical Med    RENAGEL 800 MG tablet Take 2,400 mg by mouth 3 (three) times daily. , Starting Tue 12/05/2015, Historical Med      STOP taking these medications     ceFEPIme 2 g in dextrose 5 % 50 mL      furosemide (LASIX) 20 MG tablet      pantoprazole (PROTONIX) 40 MG tablet        No Known Allergies Discharge Instructions    Diet renal [id number]    Complete by:  As directed    Discharge instructions    Complete by:  As directed    It is important that you read following instructions as well as go over your medication list with RN to help you understand your care after this hospitalization.  Discharge  Instructions: Please follow-up with PCP in one week  Please request your primary care physician to go over all Hospital Tests and Procedure/Radiological results at the follow up,  Please get all Hospital records sent to your PCP by signing hospital release before you go home.   Do not take more than prescribed Pain, Sleep and Anxiety Medications. You were cared for by a hospitalist during your hospital stay. If you have any questions about your discharge medications or the care you received while you were in the hospital after you are discharged, you can call the unit and ask to speak with the hospitalist on call if the hospitalist that took care of you is not available.  Once you are discharged, your primary care physician will handle any further medical issues. Please note that NO REFILLS for any discharge medications will be authorized once you are discharged, as it is imperative that you return to your primary care physician (or establish a relationship with a primary care physician if you do not have one) for your aftercare needs so that they can reassess your need for medications and monitor your lab values. You Must read complete instructions/literature along with all the possible adverse reactions/side effects for all the Medicines you take and that  have been prescribed to you. Take any new Medicines after you have completely understood and accept all the possible adverse reactions/side effects. Wear Seat belts while driving. If you have smoked or chewed Tobacco in the last 2 yrs please stop smoking and/or stop any Recreational drug use.   Increase activity slowly    Complete by:  As directed      Discharge Exam: Filed Weights   04/23/16 0820 04/23/16 1227 04/23/16 2213  Weight: 86.9 kg (191 lb 9.3 oz) 82.6 kg (182 lb 1.6 oz) 83.1 kg (183 lb 4.8 oz)   Vitals:   04/24/16 0615 04/24/16 1007  BP: (!) 171/99 (!) 173/94  Pulse: 85 80  Resp: 16 18  Temp: 97.9 F (36.6 C) 99 F (37.2 C)    General: Appear in no distress, no Rash; Oral Mucosa moist. Cardiovascular: S1 and S2 Present, no Murmur, no JVD Respiratory: Bilateral Air entry present and Clear to Auscultation, no Crackles, no wheezes Abdomen: Bowel Sound present, Soft and no tenderness Extremities: no Pedal edema, no calf tenderness Neurology: Grossly no focal neuro deficit.  The results of significant diagnostics from this hospitalization (including imaging, microbiology, ancillary and laboratory) are listed below for reference.    Significant Diagnostic Studies: Dg Chest Portable 1 View  Result Date: 04/22/2016 CLINICAL DATA:  Dyspnea tonight. EXAM: PORTABLE CHEST 1 VIEW COMPARISON:  02/13/2016 FINDINGS: Dense airspace consolidation in the central portions of both lungs, as well as mild vascular fullness and indistinctness. This could represent alveolar edema. Infectious infiltrates are not excluded. Mild right lateral costophrenic angle blunting probably represents a small pleural effusion. IMPRESSION: Symmetric central airspace opacities and mild vascular fullness. This could represent alveolar edema but infectious infiltrates are not excluded. Electronically Signed   By: Ellery Plunk M.D.   On: 04/22/2016 04:53    Microbiology: Recent Results (from the past 240 hour(s))  Culture, blood (routine x 2) Call MD if unable to obtain prior to antibiotics being given     Status: None (Preliminary result)   Collection Time: 04/22/16  2:03 PM  Result Value Ref Range Status   Specimen Description BLOOD BLOOD RIGHT HAND  Final   Special Requests IN PEDIATRIC BOTTLE 3CC  Final   Culture NO GROWTH < 24 HOURS  Final   Report Status PENDING  Incomplete  MRSA PCR Screening     Status: None   Collection Time: 04/22/16  2:06 PM  Result Value Ref Range Status   MRSA by PCR NEGATIVE NEGATIVE Final    Comment:        The GeneXpert MRSA Assay (FDA approved for NASAL specimens only), is one component of a comprehensive  MRSA colonization surveillance program. It is not intended to diagnose MRSA infection nor to guide or monitor treatment for MRSA infections.   Culture, blood (routine x 2) Call MD if unable to obtain prior to antibiotics being given     Status: None (Preliminary result)   Collection Time: 04/22/16  2:10 PM  Result Value Ref Range Status   Specimen Description BLOOD BLOOD RIGHT HAND  Final   Special Requests IN PEDIATRIC BOTTLE 3CC  Final   Culture NO GROWTH < 24 HOURS  Final   Report Status PENDING  Incomplete     Labs: CBC:  Recent Labs Lab 04/22/16 0417 04/22/16 0427 04/23/16 0228 04/24/16 0551  WBC 17.2*  --  13.0* 7.9  HGB 12.4* 13.9  13.9 11.2* 12.1*  HCT 38.7* 41.0  41.0 35.6* 37.4*  MCV 87.0  --  87.0 85.4  PLT 311  --  247 250   Basic Metabolic Panel:  Recent Labs Lab 04/22/16 0427 04/22/16 0513 04/22/16 1407 04/23/16 0228 04/24/16 0551  NA 141  141 141 140 138 138  K 8.0*  8.0* 5.7* 4.3 4.3 4.1  CL 113*  113* 106 101 98* 98*  CO2  --  20* 27 27 26   GLUCOSE 96  96 90 82 98 87  BUN 92*  92* 64* 35* 51* 40*  CREATININE 18.00*  18.00* 16.56* 11.63*  11.81* 13.63* 10.99*  CALCIUM  --  9.9 9.8 9.2 9.6   Liver Function Tests:  Recent Labs Lab 04/23/16 0228  AST 13*  ALT 16*  ALKPHOS 58  BILITOT 1.0  PROT 6.2*  ALBUMIN 3.3*   Time spent: 30 minutes  Signed:  Buddy Loeffelholz  [facility] 04/24/2016 , 6:23 PM

## 2016-04-24 NOTE — Progress Notes (Signed)
Patient ID: Karin Goldeneanthony Schmelter, male   DOB: August 08, 1995, 20 y.o.   MRN: [id number] S:Feels better and going home today O:BP (!) 171/99 (BP Location: Right Arm)   Pulse 85   Temp 97.9 F (36.6 C) (Oral)   Resp 16   Ht 6' (1.829 m)   Wt 83.1 kg (183 lb 4.8 oz)   SpO2 100%   BMI 24.86 kg/m   Intake/Output Summary (Last 24 hours) at 04/24/16 0919 Last data filed at 04/24/16 [id number]  Gross per 24 hour  Intake              840 ml  Output             3959 ml  Net            -3119 ml   Intake/Output: I/O last 3 completed shifts: In: 1080 [P.O.:1080] Out: 3959 [Other:3959]  Intake/Output this shift:  No intake/output data recorded. Weight change: 1.216 kg (2 lb 10.9 oz) Gen:WD WN AAM in NAD EXT- no edema, LUAVF +T/B   Recent Labs Lab 04/22/16 0427 04/22/16 0513 04/22/16 1407 04/23/16 0228 04/24/16 0551  NA 141  141 141 140 138 138  K 8.0*  8.0* 5.7* 4.3 4.3 4.1  CL 113*  113* 106 101 98* 98*  CO2  --  20* 27 27 26   GLUCOSE 96  96 90 82 98 87  BUN 92*  92* 64* 35* 51* 40*  CREATININE 18.00*  18.00* 16.56* 11.63*  11.81* 13.63* 10.99*  ALBUMIN  --   --   --  3.3*  --   CALCIUM  --  9.9 9.8 9.2 9.6  AST  --   --   --  13*  --   ALT  --   --   --  16*  --    Liver Function Tests:  Recent Labs Lab 04/23/16 0228  AST 13*  ALT 16*  ALKPHOS 58  BILITOT 1.0  PROT 6.2*  ALBUMIN 3.3*   No results for input(s): LIPASE, AMYLASE in the last 168 hours. No results for input(s): AMMONIA in the last 168 hours. CBC:  Recent Labs Lab 04/22/16 0417 04/22/16 0427 04/23/16 0228 04/24/16 0551  WBC 17.2*  --  13.0* 7.9  HGB 12.4* 13.9  13.9 11.2* 12.1*  HCT 38.7* 41.0  41.0 35.6* 37.4*  MCV 87.0  --  87.0 85.4  PLT 311  --  247 250   Cardiac Enzymes: No results for input(s): CKTOTAL, CKMB, CKMBINDEX, TROPONINI in the last 168 hours. CBG: No results for input(s): GLUCAP in the last 168 hours.  Iron Studies: No results for input(s): IRON, TIBC, TRANSFERRIN, FERRITIN in  the last 72 hours. Studies/Results: No results found. Marland Kitchen. amLODipine  10 mg Oral Daily  . carvedilol  3.125 mg Oral BID WC  . ceFEPime (MAXIPIME) IV  2 g Intravenous Q T,Th,Sat-1800  . fluticasone  1 spray Each Nare Daily  . furosemide  20 mg Oral Daily  . heparin  5,000 Units Subcutaneous Q8H  . isosorbide mononitrate  60 mg Oral Daily  . losartan  100 mg Oral Daily  . pantoprazole  40 mg Oral BID  . sodium chloride flush  3 mL Intravenous Q12H    BMET    Component Value Date/Time   NA 138 04/24/2016 0551   K 4.1 04/24/2016 0551   CL 98 (L) 04/24/2016 0551   CO2 26 04/24/2016 0551   GLUCOSE 87 04/24/2016 0551   BUN 40 (H) 04/24/2016  0551   CREATININE 10.99 (H) 04/24/2016 0551   CALCIUM 9.6 04/24/2016 0551   GFRNONAA 6 (L) 04/24/2016 0551   GFRAA 7 (L) 04/24/2016 0551   CBC    Component Value Date/Time   WBC 7.9 04/24/2016 0551   RBC 4.38 04/24/2016 0551   HGB 12.1 (L) 04/24/2016 0551   HCT 37.4 (L) 04/24/2016 0551   PLT 250 04/24/2016 0551   MCV 85.4 04/24/2016 0551   MCH 27.6 04/24/2016 0551   MCHC 32.4 04/24/2016 0551   RDW 14.5 04/24/2016 0551   LYMPHSABS 1.6 07/19/2014 2108   MONOABS 0.6 07/19/2014 2108   EOSABS 0.4 07/19/2014 2108   BASOSABS 0.0 07/19/2014 2108     Dialysis Orders: [facility] . EDW 187lbs (85kg) HD Bath 2K/2.5Ca  Time 4 hours Heparin 3000 bolus, 500/hr. Access LAVF BFR 400 DFR 800    Hectoral 3 mcg IV/HD Aranesp 35mcg qweek     Assessment/Plan: 1. Respiratory Failure - due to nonadherence with HD and possible HCAP (only 1kg above edw per outpatient HD units records).  s/p Urgent HD with UF and off BiPap.   1. has improved and able to wean off of oxygen 2. Continue with Abx with HD per primary 2. Hyperkalemia - due to nonadherence with HD.  Improved  3.  ESRD -  is now back on schedule 1. Post HD weight was 82.6kg and would set new edw as 83kg.   2. [facility].   4.  Hypertension/volume  - as above will challenge edw  5.  Anemia  - hold aranesp due to Hgb >12 6.  Metabolic bone disease -  Continue with outpatient meds  7.  Nutrition - per primary  Irena CordsJoseph A. Dell Hurtubise, MD [facility] [phone]

## 2016-04-24 NOTE — Care Management Note (Signed)
Case Management Note  Patient Details  Name: Jeremy Huerta MRN: [id number] Date of Birth: August 26, 1995  Subjective/Objective: acute resp failure with hypoxia related to missing dialysis or possible PNA                   Action/Plan: Discharge Planning: AVS reviewed: NCM reviewed chart. NCM spoke to pt and states he is on kidney transplant list. Explained to pt the importance of compliance with his dialysis treatments to remain on top of list. Verbalized understanding. Pt will schedule follow up appt with PCP in 1-2 weeks.   PCP- Charlotte Sanesodd Wayne Robinson MD  Expected Discharge Date:  04/24/2016             Expected Discharge Plan:  Home/Self Care  In-House Referral:  NA  Discharge planning Services  CM Consult  Post Acute Care Choice:  NA Choice offered to:  NA  DME Arranged:  N/A DME Agency:  NA  HH Arranged:  NA HH Agency:  NA  Status of Service:  Completed, signed off  If discussed at Long Length of Stay Meetings, dates discussed:    Additional Comments:  Jeremy Huerta, Jeremy Jawad Ellen, RN 04/24/2016, 12:32 PM

## 2016-04-27 LAB — CULTURE, BLOOD (ROUTINE X 2)
CULTURE: NO GROWTH
Culture: NO GROWTH

## 2016-10-26 ENCOUNTER — Emergency Department (HOSPITAL_COMMUNITY): Payer: Medicaid Other

## 2016-10-26 ENCOUNTER — Emergency Department (HOSPITAL_COMMUNITY)
Admission: EM | Admit: 2016-10-26 | Discharge: 2016-10-27 | Disposition: A | Discharge status: Home / Self Care | Payer: Medicaid Other | Attending: Emergency Medicine | Admitting: Emergency Medicine

## 2016-10-26 ENCOUNTER — Encounter (HOSPITAL_COMMUNITY): Payer: Self-pay | Admitting: Emergency Medicine

## 2016-10-26 DIAGNOSIS — R112 Nausea with vomiting, unspecified: Secondary | ICD-10-CM

## 2016-10-26 DIAGNOSIS — R519 Headache, unspecified: Secondary | ICD-10-CM

## 2016-10-26 DIAGNOSIS — N186 End stage renal disease: Secondary | ICD-10-CM | POA: Diagnosis not present

## 2016-10-26 DIAGNOSIS — R51 Headache: Secondary | ICD-10-CM | POA: Diagnosis not present

## 2016-10-26 DIAGNOSIS — I120 Hypertensive chronic kidney disease with stage 5 chronic kidney disease or end stage renal disease: Secondary | ICD-10-CM | POA: Diagnosis not present

## 2016-10-26 DIAGNOSIS — Z79899 Other long term (current) drug therapy: Secondary | ICD-10-CM | POA: Insufficient documentation

## 2016-10-26 DIAGNOSIS — Z992 Dependence on renal dialysis: Secondary | ICD-10-CM | POA: Insufficient documentation

## 2016-10-26 DIAGNOSIS — R197 Diarrhea, unspecified: Secondary | ICD-10-CM | POA: Diagnosis not present

## 2016-10-26 DIAGNOSIS — R1030 Lower abdominal pain, unspecified: Secondary | ICD-10-CM | POA: Insufficient documentation

## 2016-10-26 LAB — COMPREHENSIVE METABOLIC PANEL
ALBUMIN: 4.3 g/dL (ref 3.5–5.0)
ALK PHOS: 101 U/L (ref 38–126)
ALT: 14 U/L — ABNORMAL LOW (ref 17–63)
ANION GAP: 15 (ref 5–15)
AST: 14 U/L — AB (ref 15–41)
BILIRUBIN TOTAL: 1.1 mg/dL (ref 0.3–1.2)
BUN: 31 mg/dL — AB (ref 6–20)
CALCIUM: 8.7 mg/dL — AB (ref 8.9–10.3)
CO2: 27 mmol/L (ref 22–32)
Chloride: 93 mmol/L — ABNORMAL LOW (ref 101–111)
Creatinine, Ser: 14.13 mg/dL — ABNORMAL HIGH (ref 0.61–1.24)
GFR calc Af Amer: 5 mL/min — ABNORMAL LOW (ref >60)
GFR, EST NON AFRICAN AMERICAN: 4 mL/min — AB (ref >60)
GLUCOSE: 84 mg/dL (ref 65–99)
Potassium: 3.9 mmol/L (ref 3.5–5.1)
Sodium: 135 mmol/L (ref 135–145)
TOTAL PROTEIN: 7.7 g/dL (ref 6.5–8.1)

## 2016-10-26 LAB — CBC WITH DIFFERENTIAL/PLATELET
BASOS ABS: 0.1 10*3/uL (ref 0.0–0.1)
BASOS PCT: 1 %
EOS ABS: 0.2 10*3/uL (ref 0.0–0.7)
Eosinophils Relative: 4 %
HCT: 37.1 % — ABNORMAL LOW (ref 39.0–52.0)
Hemoglobin: 11.8 g/dL — ABNORMAL LOW (ref 13.0–17.0)
Lymphocytes Relative: 18 %
Lymphs Abs: 1.2 10*3/uL (ref 0.7–4.0)
MCH: 28.8 pg (ref 26.0–34.0)
MCHC: 31.8 g/dL (ref 30.0–36.0)
MCV: 90.5 fL (ref 78.0–100.0)
MONO ABS: 0.8 10*3/uL (ref 0.1–1.0)
MONOS PCT: 13 %
Neutro Abs: 4.3 10*3/uL (ref 1.7–7.7)
Neutrophils Relative %: 64 %
PLATELETS: 216 10*3/uL (ref 150–400)
RBC: 4.1 MIL/uL — ABNORMAL LOW (ref 4.22–5.81)
RDW: 15.3 % (ref 11.5–15.5)
WBC: 6.6 10*3/uL (ref 4.0–10.5)

## 2016-10-26 LAB — I-STAT CG4 LACTIC ACID, ED: LACTIC ACID, VENOUS: 0.91 mmol/L (ref 0.5–1.9)

## 2016-10-26 LAB — LIPASE, BLOOD: LIPASE: 36 U/L (ref 11–51)

## 2016-10-26 MED ORDER — DIPHENHYDRAMINE HCL 50 MG/ML IJ SOLN
25.0000 mg | Freq: Once | INTRAMUSCULAR | Status: AC
  Administered 2016-10-26: 25 mg via INTRAVENOUS
  Filled 2016-10-26: qty 1

## 2016-10-26 MED ORDER — METOCLOPRAMIDE HCL 5 MG/ML IJ SOLN
10.0000 mg | Freq: Once | INTRAMUSCULAR | Status: AC
  Administered 2016-10-26: 10 mg via INTRAVENOUS
  Filled 2016-10-26: qty 2

## 2016-10-26 NOTE — ED Provider Notes (Signed)
[facility]-EMERGENCY DEPT Provider Note   CSN: [id number] Arrival date & time: 10/26/16  1946     History   Chief Complaint Chief Complaint  Patient presents with  . Weakness  . Nausea  . Emesis  . Diarrhea    HPI Jeremy Huerta is a 21 y.o. male.  Jeremy Huerta is a 21 y.o. Male with ESRD on dialysis Tues, Thurs, Sat who presents to the ED complaining of three days of nausea, vomiting, diarrhea, lower abdominal pain, fevers, chills, and lightheadedness. He reports his last full dialysis was today. He reports 3 days ago he had fatigue and subjective fevers at home. He reports vomiting and diarrhea started yesterday. She reports some mild bilateral lower abdominal pain. He denies previous abdominal surgeries. He has previously been on peritoneal dialysis, and is no longer on peritoneal dialysis. He reports he only makes a small amount of urine in the mornings occasionally. No previous abdominal surgeries. No treatments attempted prior to arrival today. He denies changes to his vision, neck stiffness, sore throat, chest pain, shortness of breath, coughing, urinary symptoms, hematemesis, hematochezia, or rashes.   The history is provided by the patient and medical records. No language interpreter was used.  Weakness  Primary symptoms include no dizziness. Associated symptoms include vomiting and headaches. Pertinent negatives include no shortness of breath and no chest pain.  Emesis   Associated symptoms include abdominal pain, chills, diarrhea, a fever and headaches. Pertinent negatives include no cough.  Diarrhea   Associated symptoms include abdominal pain, vomiting, chills and headaches. Pertinent negatives include no cough.    Past Medical History:  Diagnosis Date  . Acute respiratory failure (HCC)   . ESRD (end stage renal disease) (HCC)   . FSGS (focal segmental glomerulosclerosis)   . Hypertension   . Renal disorder    on dialysis    Patient Active Problem List   Diagnosis Date Noted  . Acute respiratory failure (HCC) 04/22/2016  . Diarrhea 04/22/2016  . HCAP (healthcare-associated pneumonia) 04/22/2016  . Tachycardia 04/22/2016  . Renal disorder   . Hypertension   . ESRD (end stage renal disease) (HCC)   . Essential hypertension     Past Surgical History:  Procedure Laterality Date  . AV FISTULA PLACEMENT  12/2015  . RENAL BIOPSY  2013       Home Medications    Prior to Admission medications   Medication Sig Start Date End Date Taking? Authorizing Provider  amLODipine (NORVASC) 10 MG tablet Take 10 mg by mouth daily.  07/15/14   [provider]  fluticasone (FLONASE) 50 MCG/ACT nasal spray Place 1 spray into both nostrils daily. 04/13/16   [provider]  hydrALAZINE (APRESOLINE) 100 MG tablet Take 100 mg by mouth 3 (three) times daily.  04/13/16   [provider]  isosorbide mononitrate (IMDUR) 60 MG 24 hr tablet Take 60 mg by mouth daily. 12/31/15   [provider]  labetalol (NORMODYNE) 200 MG tablet Take 400 mg by mouth 3 (three) times daily. 03/16/16   [provider]  loperamide (IMODIUM A-D) 2 MG tablet Take 1 tablet (2 mg total) by mouth 4 (four) times daily as needed for diarrhea or loose stools. 02/13/16   Vanetta Mulders, MD  losartan (COZAAR) 100 MG tablet Take 100 mg by mouth daily. 03/16/16   [provider]  metoCLOPramide (REGLAN) 10 MG tablet Take 1 tablet (10 mg total) by mouth 3 (three) times daily as needed for nausea (headache / nausea). 10/27/16  Everlene Farrier, PA-C  ondansetron (ZOFRAN ODT) 4 MG disintegrating tablet Take 1 tablet (4 mg total) by mouth every 8 (eight) hours as needed for nausea or vomiting. 02/13/16   Vanetta Mulders, MD  ranitidine (ZANTAC) 150 MG capsule Take 150 mg by mouth daily.    [provider]  RENAGEL 800 MG tablet Take 2,400 mg by mouth 3 (three) times daily.  12/05/15   [provider]  saccharomyces boulardii  (FLORASTOR) 250 MG capsule Take 1 capsule (250 mg total) by mouth 2 (two) times daily. 04/24/16   Rolly Salter, MD    Family History Family History  Problem Relation Age of Onset  . Hypertension Mother   . Diabetes Father   . Hypertension Father   . Kidney disease Father   . Hypertension Maternal Grandmother   . Diabetes Paternal Grandmother   . Hypertension Paternal Grandmother     Social History Social History  Substance Use Topics  . Smoking status: Never Smoker  . Smokeless tobacco: Never Used  . Alcohol use No     Allergies   Patient has no known allergies.   Review of Systems Review of Systems  Constitutional: Positive for chills, fatigue and fever.  HENT: Negative for congestion and sore throat.   Eyes: Positive for photophobia. Negative for pain and visual disturbance.  Respiratory: Negative for cough, shortness of breath and wheezing.   Cardiovascular: Negative for chest pain.  Gastrointestinal: Positive for abdominal pain, diarrhea, nausea and vomiting. Negative for blood in stool and constipation.  Genitourinary: Negative for dysuria.  Musculoskeletal: Negative for back pain and neck stiffness.  Skin: Negative for rash.  Neurological: Positive for light-headedness and headaches. Negative for dizziness, syncope and numbness.     Physical Exam Updated Vital Signs BP (!) 170/136   Pulse 88   Temp (!) 100.4 F (38 C) (Rectal)   Resp (!) 21   Ht 6' (1.829 m)   Wt 90.7 kg (200 lb)   SpO2 96%   BMI 27.12 kg/m   Physical Exam  Constitutional: He is oriented to person, place, and time. He appears well-developed and well-nourished. No distress.  Nontoxic appearing.  HENT:  Head: Normocephalic and atraumatic.  Mouth/Throat: Oropharynx is clear and moist.  Eyes: Conjunctivae are normal. Pupils are equal, round, and reactive to light. Right eye exhibits no discharge. Left eye exhibits no discharge.  Neck: Normal range of motion. Neck supple. No JVD  present.  Cardiovascular: Normal rate, regular rhythm, normal heart sounds and intact distal pulses.  Exam reveals no gallop and no friction rub.   No murmur heard. Pulmonary/Chest: Effort normal and breath sounds normal. No stridor. No respiratory distress. He has no wheezes. He has no rales.  Lungs are clear to ascultation bilaterally. Symmetric chest expansion bilaterally. No increased work of breathing. No rales or rhonchi.    Abdominal: Soft. Bowel sounds are normal. He exhibits no distension and no mass. There is tenderness. There is no guarding.  Abdomen is soft, bowel sounds are present. Bilateral lower abdominal TTP. No CVA or flank TTP. No peritoneal signs.   Musculoskeletal: He exhibits no edema or tenderness.  Lymphadenopathy:    He has no cervical adenopathy.  Neurological: He is alert and oriented to person, place, and time. No cranial nerve deficit or sensory deficit. He exhibits normal muscle tone. Coordination normal.  Alert and oriented 3. Cranial nerves are intact. Speech is clear and coherent. No meningeal signs.  Skin: Skin is warm and dry. Capillary  refill takes less than 2 seconds. No rash noted. He is not diaphoretic. No erythema. No pallor.  Psychiatric: He has a normal mood and affect. His behavior is normal.  Nursing note and vitals reviewed.    ED Treatments / Results  Labs (all labs ordered are listed, but only abnormal results are displayed) Labs Reviewed  COMPREHENSIVE METABOLIC PANEL - Abnormal; Notable for the following:       Result Value   Chloride 93 (*)    BUN 31 (*)    Creatinine, Ser 14.13 (*)    Calcium 8.7 (*)    AST 14 (*)    ALT 14 (*)    GFR calc non Af Amer 4 (*)    GFR calc Af Amer 5 (*)    All other components within normal limits  CBC WITH DIFFERENTIAL/PLATELET - Abnormal; Notable for the following:    RBC 4.10 (*)    Hemoglobin 11.8 (*)    HCT 37.1 (*)    All other components within normal limits  CULTURE, BLOOD (ROUTINE X 2)    CULTURE, BLOOD (ROUTINE X 2)  LIPASE, BLOOD  URINALYSIS, ROUTINE W REFLEX MICROSCOPIC  I-STAT CG4 LACTIC ACID, ED    EKG  EKG Interpretation  Date/Time:  Saturday October 26 2016 19:56:32 EDT Ventricular Rate:  91 PR Interval:    QRS Duration: 88 QT Interval:  394 QTC Calculation: 485 R Axis:   77 Text Interpretation:  Sinus rhythm Borderline T wave abnormalities Borderline prolonged QT interval When compared to prior, no significant changes seen.  No STEMI Confirmed by Theda Belfast ([id number]) on 10/26/2016 9:46:39 PM       Radiology Dg Chest 2 View  Result Date: 10/26/2016 CLINICAL DATA:  Dyspnea and hypertension.  Headache. EXAM: CHEST  2 VIEW COMPARISON:  04/22/2016 FINDINGS: The heart size and mediastinal contours are within normal limits. Both lungs are clear. The visualized skeletal structures are unremarkable. IMPRESSION: No active cardiopulmonary disease. Electronically Signed   By: Tollie Eth M.D.   On: 10/26/2016 21:01    Procedures Procedures (including critical care time)  Medications Ordered in ED Medications  metoCLOPramide (REGLAN) injection 10 mg (10 mg Intravenous Given 10/26/16 2026)  diphenhydrAMINE (BENADRYL) injection 25 mg (25 mg Intravenous Given 10/26/16 2027)     Initial Impression / Assessment and Plan / ED Course  I have reviewed the triage vital signs and the nursing notes.  Pertinent labs & imaging results that were available during my care of the patient were reviewed by me and considered in my medical decision making (see chart for details).    This  is a 21 y.o. Male with ESRD on dialysis Tues, Thurs, Sat who presents to the ED complaining of three days of nausea, vomiting, diarrhea, lower abdominal pain, fevers, chills, and lightheadedness. He reports his last full dialysis was today. He reports 3 days ago he had fatigue and subjective fevers at home. He reports vomiting and diarrhea started yesterday. She reports some mild bilateral lower abdominal  pain. He denies previous abdominal surgeries. He has previously been on peritoneal dialysis, and is no longer on peritoneal dialysis. He reports he only makes a small amount of urine in the mornings occasionally. No previous abdominal surgeries. On exam the patient is nontoxic appearing. He was noted to have rectal temperature 100.4 on arrival. His abdomen is soft and he has mild bilateral lower abdominal tenderness to palpation. No peritoneal signs. Lungs clear to auscultation bilaterally. No meningeal signs. No focal neurological  deficits.  Lactic acid is within normal limits. His CMP shows a creatinine around his baseline. Consistent with end-stage renal disease. Normal potassium. Lipase is within normal limits. CBC shows no leukocytosis. Chest x-ray is unremarkable. At reevaluation patient reports he is feeling much better. Repeat abdominal exam is benign. Patient drinks water and crackers without difficulty. No nausea or vomiting. We'll discharge at this time with prescription for Reglan. Close follow-up by PCP. I discussed strict and specific return precautions with the patient. I advised the patient to follow-up with their primary care provider this week. I advised the patient to return to the emergency department with new or worsening symptoms or new concerns. The patient verbalized understanding and agreement with plan.    This patient was discussed with Dr. Rush Landmarkegeler who agrees with assessment and plan.   Final Clinical Impressions(s) / ED Diagnoses   Final diagnoses:  Nausea vomiting and diarrhea  ESRD on dialysis [facility](HCC)  Lower abdominal pain  Bad headache    New Prescriptions New Prescriptions   METOCLOPRAMIDE (REGLAN) 10 MG TABLET    Take 1 tablet (10 mg total) by mouth 3 (three) times daily as needed for nausea (headache / nausea).     Everlene FarrierDansie, Teliyah Royal, PA-C 10/27/16 0103    Tegeler, Canary Brimhristopher J, MD 10/27/16 [id number]

## 2016-10-26 NOTE — ED Triage Notes (Signed)
Pt presents via ems for headache, nausea, vomiting, and diarrhea. Pt is a dialysis pt last treatment today. Pt A+Ox 4

## 2016-10-26 NOTE — ED Notes (Signed)
Patient transported to X-ray 

## 2016-10-27 MED ORDER — METOCLOPRAMIDE HCL 10 MG PO TABS: 10.0000 mg | ORAL_TABLET | Freq: Three times a day (TID) | ORAL | 0 refills | Status: DC | PRN

## 2016-10-27 NOTE — ED Notes (Signed)
Pt understood dc material. NAD noted. Scripts given at dc 

## 2016-10-31 LAB — CULTURE, BLOOD (ROUTINE X 2)
CULTURE: NO GROWTH
CULTURE: NO GROWTH
SPECIAL REQUESTS: ADEQUATE
Special Requests: ADEQUATE

## 2016-11-17 DIAGNOSIS — R112 Nausea with vomiting, unspecified: Secondary | ICD-10-CM

## 2016-11-17 HISTORY — DX: Nausea with vomiting, unspecified: R11.2

## 2016-11-19 ENCOUNTER — Emergency Department (HOSPITAL_COMMUNITY)
Admission: EM | Admit: 2016-11-19 | Discharge: 2016-11-19 | Disposition: A | Discharge status: Home / Self Care | Payer: Medicaid Other | Attending: Emergency Medicine | Admitting: Emergency Medicine

## 2016-11-19 ENCOUNTER — Emergency Department (HOSPITAL_COMMUNITY): Payer: Medicaid Other

## 2016-11-19 ENCOUNTER — Encounter (HOSPITAL_COMMUNITY): Payer: Self-pay | Admitting: Pharmacy Technician

## 2016-11-19 DIAGNOSIS — R509 Fever, unspecified: Secondary | ICD-10-CM | POA: Diagnosis not present

## 2016-11-19 DIAGNOSIS — B349 Viral infection, unspecified: Secondary | ICD-10-CM | POA: Diagnosis not present

## 2016-11-19 DIAGNOSIS — Z79899 Other long term (current) drug therapy: Secondary | ICD-10-CM | POA: Insufficient documentation

## 2016-11-19 DIAGNOSIS — N186 End stage renal disease: Secondary | ICD-10-CM | POA: Diagnosis not present

## 2016-11-19 DIAGNOSIS — I120 Hypertensive chronic kidney disease with stage 5 chronic kidney disease or end stage renal disease: Secondary | ICD-10-CM | POA: Insufficient documentation

## 2016-11-19 DIAGNOSIS — Z992 Dependence on renal dialysis: Secondary | ICD-10-CM | POA: Insufficient documentation

## 2016-11-19 DIAGNOSIS — M6281 Muscle weakness (generalized): Secondary | ICD-10-CM | POA: Diagnosis present

## 2016-11-19 LAB — CBC WITH DIFFERENTIAL/PLATELET
Basophils Absolute: 0.1 10*3/uL (ref 0.0–0.1)
Basophils Relative: 1 %
EOS PCT: 4 %
Eosinophils Absolute: 0.4 10*3/uL (ref 0.0–0.7)
HCT: 36.2 % — ABNORMAL LOW (ref 39.0–52.0)
Hemoglobin: 11.3 g/dL — ABNORMAL LOW (ref 13.0–17.0)
LYMPHS ABS: 1.3 10*3/uL (ref 0.7–4.0)
Lymphocytes Relative: 14 %
MCH: 28.8 pg (ref 26.0–34.0)
MCHC: 31.2 g/dL (ref 30.0–36.0)
MCV: 92.3 fL (ref 78.0–100.0)
Monocytes Absolute: 0.6 10*3/uL (ref 0.1–1.0)
Monocytes Relative: 6 %
Neutro Abs: 7.2 10*3/uL (ref 1.7–7.7)
Neutrophils Relative %: 75 %
PLATELETS: 273 10*3/uL (ref 150–400)
RBC: 3.92 MIL/uL — AB (ref 4.22–5.81)
RDW: 15.9 % — ABNORMAL HIGH (ref 11.5–15.5)
WBC: 9.5 10*3/uL (ref 4.0–10.5)

## 2016-11-19 LAB — I-STAT CHEM 8, ED
BUN: 45 mg/dL — AB (ref 6–20)
CALCIUM ION: 0.94 mmol/L — AB (ref 1.15–1.40)
CHLORIDE: 101 mmol/L (ref 101–111)
CREATININE: 14.9 mg/dL — AB (ref 0.61–1.24)
Glucose, Bld: 84 mg/dL (ref 65–99)
HCT: 37 % — ABNORMAL LOW (ref 39.0–52.0)
Hemoglobin: 12.6 g/dL — ABNORMAL LOW (ref 13.0–17.0)
Potassium: 4.4 mmol/L (ref 3.5–5.1)
Sodium: 137 mmol/L (ref 135–145)
TCO2: 25 mmol/L (ref 0–100)

## 2016-11-19 LAB — BASIC METABOLIC PANEL
ANION GAP: 17 — AB (ref 5–15)
BUN: 42 mg/dL — ABNORMAL HIGH (ref 6–20)
CHLORIDE: 99 mmol/L — AB (ref 101–111)
CO2: 22 mmol/L (ref 22–32)
Calcium: 8.6 mg/dL — ABNORMAL LOW (ref 8.9–10.3)
Creatinine, Ser: 15.76 mg/dL — ABNORMAL HIGH (ref 0.61–1.24)
GFR calc non Af Amer: 4 mL/min — ABNORMAL LOW (ref >60)
GFR, EST AFRICAN AMERICAN: 4 mL/min — AB (ref >60)
GLUCOSE: 78 mg/dL (ref 65–99)
POTASSIUM: 4.7 mmol/L (ref 3.5–5.1)
Sodium: 138 mmol/L (ref 135–145)

## 2016-11-19 LAB — I-STAT CG4 LACTIC ACID, ED: LACTIC ACID, VENOUS: 2 mmol/L — AB (ref 0.5–1.9)

## 2016-11-19 LAB — I-STAT TROPONIN, ED: Troponin i, poc: 0.05 ng/mL (ref 0.00–0.08)

## 2016-11-19 MED ORDER — ACETAMINOPHEN 325 MG PO TABS
650.0000 mg | ORAL_TABLET | Freq: Once | ORAL | Status: AC
  Administered 2016-11-19: 650 mg via ORAL
  Filled 2016-11-19: qty 2

## 2016-11-19 MED ORDER — LABETALOL HCL 200 MG PO TABS
400.0000 mg | ORAL_TABLET | Freq: Once | ORAL | Status: AC
  Administered 2016-11-19: 400 mg via ORAL
  Filled 2016-11-19: qty 2

## 2016-11-19 MED ORDER — HYDRALAZINE HCL 50 MG PO TABS
100.0000 mg | ORAL_TABLET | Freq: Once | ORAL | Status: AC
  Administered 2016-11-19: 100 mg via ORAL
  Filled 2016-11-19: qty 2

## 2016-11-19 NOTE — ED Notes (Signed)
Pt denies CP at this time 

## 2016-11-19 NOTE — ED Triage Notes (Signed)
Pt arrives to the ED from dialysis via EMS with reports of CP/Shoulder pain. Pt finished all but 20 minutes of dialysis. Pt with temp of 99.7 at dialysis. Pt hypertensive at 198/130 with EMS and complaining of a headache. Pt given Fentanyl PTA.

## 2016-11-19 NOTE — ED Provider Notes (Signed)
[facility]-EMERGENCY DEPT Provider Note   CSN: [id number] Arrival date & time: 11/19/16  1734     History   Chief Complaint Chief Complaint  Patient presents with  . Chest Pain  . Hypertension  . Fever    HPI Jeremy Huerta is a 21 y.o. male.  The history is provided by medical records and the patient.  Chest Pain   Associated symptoms include cough, a fever and weakness (generalized).  His past medical history is significant for hypertension.  Hypertension  Associated symptoms include chest pain.  Fever   Associated symptoms include chest pain, diarrhea and cough.    21 year old white male with history of end-stage renal disease on hemodialysis, FSGS, hypertension, presenting to the ED for overall not feeling well for a few days now.  Patient states he has had some generalized weakness, fatigue, headache, nasal congestion, and slight cough. States he went to dialysis today and was pulled off the machine about 20 minutes early but got most of his treatment. Dates when they took him off the machine he did feel warm and flushed. He had a oral temp of 51F.  he was unsure of temperature at home. States he has had a little diarrhea as well. He denies any nausea or vomiting. No abdominal pain. States did have some chest pain earlier but denies this currently. States breathing feels better today after having his dialysis. States he did miss his treatment on Saturday this he did not have a ride. He has not missed any other treatments.  States headache is mild, localized across the forehead.  Denies any dizziness, confusion, changes in speech, blurred vision, tinnitus, trouble walking, numbness, weakness, etc.  No intervention tried for her symptoms prior to arrival.  Past Medical History:  Diagnosis Date  . Acute respiratory failure (HCC)   . ESRD (end stage renal disease) (HCC)   . FSGS (focal segmental glomerulosclerosis)   . Hypertension   . Renal disorder    on dialysis    Patient  Active Problem List   Diagnosis Date Noted  . Acute respiratory failure (HCC) 04/22/2016  . Diarrhea 04/22/2016  . HCAP (healthcare-associated pneumonia) 04/22/2016  . Tachycardia 04/22/2016  . Renal disorder   . Hypertension   . ESRD (end stage renal disease) (HCC)   . Essential hypertension     Past Surgical History:  Procedure Laterality Date  . AV FISTULA PLACEMENT  12/2015  . RENAL BIOPSY  2013       Home Medications    Prior to Admission medications   Medication Sig Start Date End Date Taking? Authorizing Provider  amLODipine (NORVASC) 10 MG tablet Take 10 mg by mouth daily.  07/15/14   [provider]  fluticasone (FLONASE) 50 MCG/ACT nasal spray Place 1 spray into both nostrils daily. 04/13/16   [provider]  hydrALAZINE (APRESOLINE) 100 MG tablet Take 100 mg by mouth 3 (three) times daily.  04/13/16   [provider]  isosorbide mononitrate (IMDUR) 60 MG 24 hr tablet Take 60 mg by mouth daily. 12/31/15   [provider]  labetalol (NORMODYNE) 200 MG tablet Take 400 mg by mouth 3 (three) times daily. 03/16/16   [provider]  loperamide (IMODIUM A-D) 2 MG tablet Take 1 tablet (2 mg total) by mouth 4 (four) times daily as needed for diarrhea or loose stools. 02/13/16   Vanetta Mulders, MD  losartan (COZAAR) 100 MG tablet Take 100 mg by mouth daily. 03/16/16   [provider]  metoCLOPramide (REGLAN) 10 MG tablet Take 1 tablet (10 mg total) by mouth 3 (three) times daily as needed for nausea (headache / nausea). 10/27/16   Everlene Farrier, PA-C  ondansetron (ZOFRAN ODT) 4 MG disintegrating tablet Take 1 tablet (4 mg total) by mouth every 8 (eight) hours as needed for nausea or vomiting. 02/13/16   Vanetta Mulders, MD  ranitidine (ZANTAC) 150 MG capsule Take 150 mg by mouth daily.    [provider]  RENAGEL 800 MG tablet Take 2,400 mg by mouth 3 (three) times daily.  12/05/15   [provider]    saccharomyces boulardii (FLORASTOR) 250 MG capsule Take 1 capsule (250 mg total) by mouth 2 (two) times daily. 04/24/16   Rolly Salter, MD    Family History Family History  Problem Relation Age of Onset  . Hypertension Mother   . Diabetes Father   . Hypertension Father   . Kidney disease Father   . Hypertension Maternal Grandmother   . Diabetes Paternal Grandmother   . Hypertension Paternal Grandmother     Social History Social History  Substance Use Topics  . Smoking status: Never Smoker  . Smokeless tobacco: Never Used  . Alcohol use No     Allergies   Patient has no known allergies.   Review of Systems Review of Systems  Constitutional: Positive for fever.  Respiratory: Positive for cough.   Cardiovascular: Positive for chest pain.  Gastrointestinal: Positive for diarrhea.  Neurological: Positive for weakness (generalized).  All other systems reviewed and are negative.    Physical Exam Updated Vital Signs BP (!) 197/131   Pulse 87   Temp 99.4 F (37.4 C) (Oral)   Resp (!) 21   SpO2 100%   Physical Exam  Constitutional: He is oriented to person, place, and time. He appears well-developed and well-nourished. No distress.  HENT:  Head: Normocephalic and atraumatic.  Right Ear: Tympanic membrane and external ear normal.  Left Ear: Tympanic membrane and external ear normal.  Nose: Mucosal edema present.  Mouth/Throat: Uvula is midline, oropharynx is clear and moist and mucous membranes are normal.  + nasal congestion with PND  Eyes: Conjunctivae and EOM are normal. Pupils are equal, round, and reactive to light.  Neck: Normal range of motion and full passive range of motion without pain. Neck supple. No neck rigidity.  No rigidity, no meningismus  Cardiovascular: Normal rate, regular rhythm and normal heart sounds.   No murmur heard. Pulmonary/Chest: Effort normal and breath sounds normal. No respiratory distress. He has no wheezes. He has no rhonchi.   Abdominal: Soft. Bowel sounds are normal. There is no tenderness. There is no guarding.  Musculoskeletal: Normal range of motion. He exhibits no edema.  Dialysis fistula left forearm with strong thrill, there is no overlying erythema, has recently been accessed but there is no bleeding  Neurological: He is alert and oriented to person, place, and time. He has normal strength. He displays no tremor. No cranial nerve deficit or sensory deficit. He displays no seizure activity.  AAOx3, answering questions and following commands appropriately; equal strength UE and LE bilaterally; CN grossly intact; moves all extremities appropriately without ataxia; no focal neuro deficits or facial asymmetry appreciated  Skin: Skin is warm and dry. No rash noted. He is not diaphoretic.  Psychiatric: He has a normal mood and affect. His behavior is normal. Thought content normal.  Nursing note and vitals reviewed.    ED Treatments / Results  Labs (all labs ordered  are listed, but only abnormal results are displayed) Labs Reviewed  CBC WITH DIFFERENTIAL/PLATELET - Abnormal; Notable for the following:       Result Value   RBC 3.92 (*)    Hemoglobin 11.3 (*)    HCT 36.2 (*)    RDW 15.9 (*)    All other components within normal limits  BASIC METABOLIC PANEL - Abnormal; Notable for the following:    Chloride 99 (*)    BUN 42 (*)    Creatinine, Ser 15.76 (*)    Calcium 8.6 (*)    GFR calc non Af Amer 4 (*)    GFR calc Af Amer 4 (*)    Anion gap 17 (*)    All other components within normal limits  I-STAT CG4 LACTIC ACID, ED - Abnormal; Notable for the following:    Lactic Acid, Venous 2.00 (*)    All other components within normal limits  I-STAT CHEM 8, ED - Abnormal; Notable for the following:    BUN 45 (*)    Creatinine, Ser 14.90 (*)    Calcium, Ion 0.94 (*)    Hemoglobin 12.6 (*)    HCT 37.0 (*)    All other components within normal limits  CULTURE, BLOOD (ROUTINE X 2)  CULTURE, BLOOD (ROUTINE X  2)  I-STAT TROPOININ, ED    EKG  EKG Interpretation None       Radiology Dg Chest 2 View  Result Date: 11/19/2016 CLINICAL DATA:  Cough, fever, and generalized weakness. Referred from dialysis. EXAM: CHEST  2 VIEW COMPARISON:  Radiograph 10/26/2016 FINDINGS: Stable cardiomegaly allowing for differences in technique. Unchanged mediastinal contours. No pulmonary edema. No pleural effusion, focal airspace disease or pneumothorax. Trace fluid in the fissures is unchanged from prior exam. No acute osseous abnormalities. IMPRESSION: Stable cardiomegaly.  No congestive failure or acute abnormality. Electronically Signed   By: Rubye OaksMelanie  Ehinger M.D.   On: 11/19/2016 18:24    Procedures Procedures (including critical care time)  Medications Ordered in ED Medications - No data to display   Initial Impression / Assessment and Plan / ED Course  I have reviewed the triage vital signs and the nursing notes.  Pertinent labs & imaging results that were available during my care of the patient were reviewed by me and considered in my medical decision making (see chart for details).  21 year old male here with fever, cough, nasal congestion, diarrhea, and headache. He is end-stage renal disease on hemodialysis. On arrival to ED he has a low-grade fever but is overall nontoxic in appearance.  His exam is overall benign aside from some nasal congestion and postnasal drip. He does not appear significantly fluid overloaded. Dialysis fistula does not appear infected and there is no active bleeding.  Headache without focal neurologic deficits.  No signs or symptoms concerning for meningitis. Patient labwork is overall reassuring and consistent with his known end-stage renal disease. His lactate is at the upper limit of normal. His blood pressure is elevated, do for his evening meds which were given here. Chest x-ray is clear.  Blood cultures have been sent and are pending.  At this time, no obvious source of  infection. Patient's constellation of symptoms seem viral in nature. Patient is wishing to go home, feel this is appropriate. Recommended gentle oral hydration, rest. Follow-up closely with PCP if any ongoing issues.  Discussed plan with patient, he/she acknowledged understanding and agreed with plan of care.  Return precautions given for new or worsening symptoms.  Case  discussed with attending physician, Dr. Erma Heritage, who agrees with assessment and plan of care.  Final Clinical Impressions(s) / ED Diagnoses   Final diagnoses:  Fever, unspecified fever cause  Viral illness    New Prescriptions Discharge Medication List as of 11/19/2016  8:47 PM       Garlon Hatchet, PA-C 11/19/16 8119    Shaune Pollack, MD 11/20/16 1122

## 2016-11-19 NOTE — ED Notes (Signed)
PA notified on pt.'s persistent hypertension .

## 2016-11-19 NOTE — ED Notes (Signed)
ED Provider at bedside. 

## 2016-11-19 NOTE — ED Notes (Signed)
IV attempted, patient did not tolerate well asked to have needle removed

## 2016-11-19 NOTE — Discharge Instructions (Signed)
Your lab work today was overall reassuring.  We suspect that you may have a virus. Recommend gentle oral hydration, rest. Can follow-up with your primary care doctor. Return here for any new/worsening symptoms.

## 2016-11-19 NOTE — ED Notes (Signed)
Pt does not make urine.

## 2016-11-19 NOTE — ED Notes (Signed)
Pt. advised PA that he wish to go home . Pt. Denies chest pain at this time /respirations unlabored , IV sites intact.

## 2016-11-19 NOTE — ED Notes (Signed)
Unable to establish 2nd peripheral IV access despite several attempts , IV team notified.

## 2016-11-24 LAB — CULTURE, BLOOD (ROUTINE X 2)
CULTURE: NO GROWTH
Culture: NO GROWTH
SPECIAL REQUESTS: ADEQUATE
Special Requests: ADEQUATE

## 2016-12-02 ENCOUNTER — Encounter (HOSPITAL_COMMUNITY): Payer: Self-pay

## 2016-12-02 ENCOUNTER — Inpatient Hospital Stay (HOSPITAL_COMMUNITY)
Admission: EM | Admit: 2016-12-02 | Discharge: 2016-12-04 | DRG: 640 | Disposition: A | Discharge status: Home / Self Care | Payer: Medicaid Other | Attending: Internal Medicine | Admitting: Internal Medicine

## 2016-12-02 ENCOUNTER — Emergency Department (HOSPITAL_COMMUNITY)
Admission: EM | Admit: 2016-12-02 | Discharge: 2016-12-02 | Disposition: A | Discharge status: Home / Self Care | Payer: Medicaid Other | Source: Home / Self Care | Attending: Emergency Medicine | Admitting: Emergency Medicine

## 2016-12-02 DIAGNOSIS — Z8249 Family history of ischemic heart disease and other diseases of the circulatory system: Secondary | ICD-10-CM

## 2016-12-02 DIAGNOSIS — R197 Diarrhea, unspecified: Secondary | ICD-10-CM | POA: Insufficient documentation

## 2016-12-02 DIAGNOSIS — E8889 Other specified metabolic disorders: Secondary | ICD-10-CM | POA: Diagnosis present

## 2016-12-02 DIAGNOSIS — I120 Hypertensive chronic kidney disease with stage 5 chronic kidney disease or end stage renal disease: Secondary | ICD-10-CM

## 2016-12-02 DIAGNOSIS — N2581 Secondary hyperparathyroidism of renal origin: Secondary | ICD-10-CM | POA: Diagnosis present

## 2016-12-02 DIAGNOSIS — E875 Hyperkalemia: Principal | ICD-10-CM | POA: Diagnosis present

## 2016-12-02 DIAGNOSIS — Z992 Dependence on renal dialysis: Secondary | ICD-10-CM

## 2016-12-02 DIAGNOSIS — Z833 Family history of diabetes mellitus: Secondary | ICD-10-CM

## 2016-12-02 DIAGNOSIS — Z79899 Other long term (current) drug therapy: Secondary | ICD-10-CM | POA: Insufficient documentation

## 2016-12-02 DIAGNOSIS — R112 Nausea with vomiting, unspecified: Secondary | ICD-10-CM | POA: Diagnosis present

## 2016-12-02 DIAGNOSIS — D649 Anemia, unspecified: Secondary | ICD-10-CM | POA: Diagnosis present

## 2016-12-02 DIAGNOSIS — D72829 Elevated white blood cell count, unspecified: Secondary | ICD-10-CM | POA: Diagnosis not present

## 2016-12-02 DIAGNOSIS — N186 End stage renal disease: Secondary | ICD-10-CM | POA: Diagnosis present

## 2016-12-02 DIAGNOSIS — I160 Hypertensive urgency: Secondary | ICD-10-CM | POA: Diagnosis not present

## 2016-12-02 DIAGNOSIS — R1030 Lower abdominal pain, unspecified: Secondary | ICD-10-CM | POA: Insufficient documentation

## 2016-12-02 DIAGNOSIS — I10 Essential (primary) hypertension: Secondary | ICD-10-CM

## 2016-12-02 DIAGNOSIS — E8770 Fluid overload, unspecified: Secondary | ICD-10-CM | POA: Diagnosis present

## 2016-12-02 HISTORY — DX: Nausea with vomiting, unspecified: R11.2

## 2016-12-02 LAB — BASIC METABOLIC PANEL
ANION GAP: 17 — AB (ref 5–15)
BUN: 78 mg/dL — ABNORMAL HIGH (ref 6–20)
CALCIUM: 9.1 mg/dL (ref 8.9–10.3)
CO2: 17 mmol/L — ABNORMAL LOW (ref 22–32)
Chloride: 102 mmol/L (ref 101–111)
Creatinine, Ser: 21.46 mg/dL — ABNORMAL HIGH (ref 0.61–1.24)
GFR, EST AFRICAN AMERICAN: 3 mL/min — AB (ref >60)
GFR, EST NON AFRICAN AMERICAN: 3 mL/min — AB (ref >60)
GLUCOSE: 81 mg/dL (ref 65–99)
Potassium: 6 mmol/L — ABNORMAL HIGH (ref 3.5–5.1)
SODIUM: 136 mmol/L (ref 135–145)

## 2016-12-02 LAB — CBC
HCT: 35.1 % — ABNORMAL LOW (ref 39.0–52.0)
Hemoglobin: 11.2 g/dL — ABNORMAL LOW (ref 13.0–17.0)
MCH: 28.6 pg (ref 26.0–34.0)
MCHC: 31.9 g/dL (ref 30.0–36.0)
MCV: 89.8 fL (ref 78.0–100.0)
PLATELETS: 314 10*3/uL (ref 150–400)
RBC: 3.91 MIL/uL — ABNORMAL LOW (ref 4.22–5.81)
RDW: 15.6 % — AB (ref 11.5–15.5)
WBC: 11.4 10*3/uL — AB (ref 4.0–10.5)

## 2016-12-02 LAB — CBC WITH DIFFERENTIAL/PLATELET
BASOS ABS: 0.1 10*3/uL (ref 0.0–0.1)
BASOS PCT: 1 %
EOS PCT: 3 %
Eosinophils Absolute: 0.3 10*3/uL (ref 0.0–0.7)
HEMATOCRIT: 45 % (ref 39.0–52.0)
Hemoglobin: 14.4 g/dL (ref 13.0–17.0)
Lymphocytes Relative: 6 %
Lymphs Abs: 0.8 10*3/uL (ref 0.7–4.0)
MCH: 28.9 pg (ref 26.0–34.0)
MCHC: 32 g/dL (ref 30.0–36.0)
MCV: 90.2 fL (ref 78.0–100.0)
MONO ABS: 0.8 10*3/uL (ref 0.1–1.0)
Monocytes Relative: 7 %
NEUTROS ABS: 10.5 10*3/uL — AB (ref 1.7–7.7)
Neutrophils Relative %: 84 %
PLATELETS: 353 10*3/uL (ref 150–400)
RBC: 4.99 MIL/uL (ref 4.22–5.81)
RDW: 15.9 % — AB (ref 11.5–15.5)
WBC: 12.6 10*3/uL — AB (ref 4.0–10.5)

## 2016-12-02 LAB — COMPREHENSIVE METABOLIC PANEL
ALK PHOS: 86 U/L (ref 38–126)
ALT: 14 U/L — AB (ref 17–63)
AST: 15 U/L (ref 15–41)
Albumin: 4.2 g/dL (ref 3.5–5.0)
Anion gap: 15 (ref 5–15)
BILIRUBIN TOTAL: 0.8 mg/dL (ref 0.3–1.2)
BUN: 67 mg/dL — ABNORMAL HIGH (ref 6–20)
CALCIUM: 9.1 mg/dL (ref 8.9–10.3)
CO2: 19 mmol/L — ABNORMAL LOW (ref 22–32)
CREATININE: 20.93 mg/dL — AB (ref 0.61–1.24)
Chloride: 103 mmol/L (ref 101–111)
GFR, EST AFRICAN AMERICAN: 3 mL/min — AB (ref >60)
GFR, EST NON AFRICAN AMERICAN: 3 mL/min — AB (ref >60)
Glucose, Bld: 89 mg/dL (ref 65–99)
Potassium: 5.1 mmol/L (ref 3.5–5.1)
Sodium: 137 mmol/L (ref 135–145)
TOTAL PROTEIN: 7.3 g/dL (ref 6.5–8.1)

## 2016-12-02 LAB — LIPASE, BLOOD: Lipase: 207 U/L — ABNORMAL HIGH (ref 11–51)

## 2016-12-02 LAB — POTASSIUM: Potassium: 6.1 mmol/L — ABNORMAL HIGH (ref 3.5–5.1)

## 2016-12-02 MED ORDER — HYDRALAZINE HCL 50 MG PO TABS
100.0000 mg | ORAL_TABLET | Freq: Three times a day (TID) | ORAL | Status: DC
Start: 2016-12-02 — End: 2016-12-04
  Administered 2016-12-02 – 2016-12-04 (×4): 100 mg via ORAL
  Filled 2016-12-02 (×5): qty 2

## 2016-12-02 MED ORDER — HYDRALAZINE HCL 20 MG/ML IJ SOLN
10.0000 mg | INTRAMUSCULAR | Status: DC | PRN
  Administered 2016-12-03 (×2): 10 mg via INTRAVENOUS
  Filled 2016-12-02 (×3): qty 1

## 2016-12-02 MED ORDER — ONDANSETRON 4 MG PO TBDP: 4.0000 mg | ORAL_TABLET | Freq: Three times a day (TID) | ORAL | 0 refills | Status: DC | PRN

## 2016-12-02 MED ORDER — MORPHINE SULFATE (PF) 4 MG/ML IV SOLN
4.0000 mg | Freq: Once | INTRAVENOUS | Status: AC
  Administered 2016-12-02: 4 mg via INTRAVENOUS

## 2016-12-02 MED ORDER — AMLODIPINE BESYLATE 10 MG PO TABS
10.0000 mg | ORAL_TABLET | Freq: Every day | ORAL | Status: DC
  Administered 2016-12-03 – 2016-12-04 (×2): 10 mg via ORAL
  Filled 2016-12-02 (×2): qty 1

## 2016-12-02 MED ORDER — NEPHRO-VITE RX 1 MG PO TABS: 1.0000 | ORAL_TABLET | Freq: Every day | ORAL | Status: DC

## 2016-12-02 MED ORDER — LOPERAMIDE HCL 2 MG PO CAPS
4.0000 mg | ORAL_CAPSULE | Freq: Once | ORAL | Status: AC
  Administered 2016-12-02: 4 mg via ORAL
  Filled 2016-12-02: qty 2

## 2016-12-02 MED ORDER — ONDANSETRON HCL 4 MG PO TABS: 4.0000 mg | ORAL_TABLET | Freq: Four times a day (QID) | ORAL | Status: DC | PRN

## 2016-12-02 MED ORDER — SODIUM CHLORIDE 0.9 % IV BOLUS (SEPSIS)
500.0000 mL | Freq: Once | INTRAVENOUS | Status: AC
  Administered 2016-12-02: 500 mL via INTRAVENOUS

## 2016-12-02 MED ORDER — HEPARIN SODIUM (PORCINE) 5000 UNIT/ML IJ SOLN
5000.0000 [IU] | Freq: Three times a day (TID) | INTRAMUSCULAR | Status: DC
  Administered 2016-12-03: 5000 [IU] via SUBCUTANEOUS
  Filled 2016-12-02 (×2): qty 1

## 2016-12-02 MED ORDER — CLONIDINE HCL 0.1 MG/24HR TD PTWK
0.1000 mg | MEDICATED_PATCH | TRANSDERMAL | Status: DC
  Administered 2016-12-02: 0.1 mg via TRANSDERMAL
  Filled 2016-12-02: qty 1

## 2016-12-02 MED ORDER — SODIUM CHLORIDE 0.9% FLUSH: 3.0000 mL | Freq: Two times a day (BID) | INTRAVENOUS | Status: DC

## 2016-12-02 MED ORDER — DIPHENHYDRAMINE HCL 50 MG/ML IJ SOLN
25.0000 mg | Freq: Once | INTRAMUSCULAR | Status: AC
  Administered 2016-12-02: 25 mg via INTRAVENOUS
  Filled 2016-12-02: qty 1

## 2016-12-02 MED ORDER — METOCLOPRAMIDE HCL 5 MG/ML IJ SOLN
10.0000 mg | Freq: Once | INTRAMUSCULAR | Status: AC
  Administered 2016-12-02: 10 mg via INTRAVENOUS
  Filled 2016-12-02: qty 2

## 2016-12-02 MED ORDER — HYDROCODONE-ACETAMINOPHEN 5-325 MG PO TABS
1.0000 | ORAL_TABLET | ORAL | Status: DC | PRN
  Administered 2016-12-02: 1 via ORAL
  Administered 2016-12-03: 2 via ORAL
  Administered 2016-12-03: 1 via ORAL
  Administered 2016-12-04: 2 via ORAL
  Filled 2016-12-02 (×2): qty 2
  Filled 2016-12-02 (×2): qty 1

## 2016-12-02 MED ORDER — SODIUM CHLORIDE 0.9% FLUSH
3.0000 mL | Freq: Two times a day (BID) | INTRAVENOUS | Status: DC
Start: 2016-12-02 — End: 2016-12-04
  Administered 2016-12-02 – 2016-12-03 (×3): 3 mL via INTRAVENOUS

## 2016-12-02 MED ORDER — ACETAMINOPHEN 325 MG PO TABS
650.0000 mg | ORAL_TABLET | Freq: Four times a day (QID) | ORAL | Status: DC | PRN
  Administered 2016-12-03: 650 mg via ORAL
  Filled 2016-12-02: qty 2

## 2016-12-02 MED ORDER — PROMETHAZINE HCL 25 MG/ML IJ SOLN
12.5000 mg | Freq: Once | INTRAMUSCULAR | Status: AC
  Administered 2016-12-02: 12.5 mg via INTRAVENOUS
  Filled 2016-12-02: qty 1

## 2016-12-02 MED ORDER — METOCLOPRAMIDE HCL 10 MG PO TABS
10.0000 mg | ORAL_TABLET | Freq: Three times a day (TID) | ORAL | Status: DC | PRN
  Administered 2016-12-03 – 2016-12-04 (×3): 10 mg via ORAL
  Filled 2016-12-02 (×3): qty 1

## 2016-12-02 MED ORDER — SODIUM POLYSTYRENE SULFONATE 15 GM/60ML PO SUSP
30.0000 g | Freq: Once | ORAL | Status: AC
  Administered 2016-12-02: 30 g via ORAL
  Filled 2016-12-02: qty 120

## 2016-12-02 MED ORDER — LABETALOL HCL 200 MG PO TABS
400.0000 mg | ORAL_TABLET | Freq: Three times a day (TID) | ORAL | Status: DC
  Administered 2016-12-02 – 2016-12-04 (×4): 400 mg via ORAL
  Filled 2016-12-02 (×4): qty 2

## 2016-12-02 MED ORDER — ONDANSETRON HCL 4 MG/2ML IJ SOLN
4.0000 mg | Freq: Four times a day (QID) | INTRAMUSCULAR | Status: DC | PRN
  Administered 2016-12-03 – 2016-12-04 (×3): 4 mg via INTRAVENOUS
  Filled 2016-12-02 (×3): qty 2

## 2016-12-02 MED ORDER — SODIUM CHLORIDE 0.9% FLUSH: 3.0000 mL | INTRAVENOUS | Status: DC | PRN

## 2016-12-02 MED ORDER — ONDANSETRON HCL 4 MG/2ML IJ SOLN
4.0000 mg | Freq: Once | INTRAMUSCULAR | Status: AC
  Administered 2016-12-02: 4 mg via INTRAVENOUS
  Filled 2016-12-02: qty 2

## 2016-12-02 MED ORDER — ACETAMINOPHEN 650 MG RE SUPP: 650.0000 mg | Freq: Four times a day (QID) | RECTAL | Status: DC | PRN

## 2016-12-02 MED ORDER — MORPHINE SULFATE (PF) 4 MG/ML IV SOLN
4.0000 mg | Freq: Once | INTRAVENOUS | Status: AC
  Administered 2016-12-02: 4 mg via INTRAVENOUS
  Filled 2016-12-02: qty 1

## 2016-12-02 MED ORDER — SODIUM CHLORIDE 0.9 % IV SOLN: 250.0000 mL | INTRAVENOUS | Status: DC | PRN

## 2016-12-02 MED ORDER — ISOSORBIDE MONONITRATE ER 60 MG PO TB24
60.0000 mg | ORAL_TABLET | Freq: Every day | ORAL | Status: DC
  Administered 2016-12-03 – 2016-12-04 (×2): 60 mg via ORAL
  Filled 2016-12-02 (×2): qty 1

## 2016-12-02 MED ORDER — CINACALCET HCL 30 MG PO TABS
30.0000 mg | ORAL_TABLET | Freq: Three times a day (TID) | ORAL | Status: DC
  Administered 2016-12-02 – 2016-12-03 (×2): 30 mg via ORAL
  Filled 2016-12-02 (×2): qty 1

## 2016-12-02 MED ORDER — ONDANSETRON 4 MG PO TBDP
4.0000 mg | ORAL_TABLET | Freq: Once | ORAL | Status: AC
  Administered 2016-12-02: 4 mg via ORAL
  Filled 2016-12-02: qty 1

## 2016-12-02 NOTE — ED Notes (Signed)
The pt missed dialysis today he did not have a ride  His fistula is in his lt arm  abd pain nausea some diarrhea   drowsy

## 2016-12-02 NOTE — ED Provider Notes (Signed)
Care assumed from previous provider PA [place]. Please see note for further details. Case discussed, plan agreed upon. Will recheck after fluids, pain/nausea control meds. Will also need to pass PO challenge.   Patient tolerated PO with no further episodes of emesis.  Patient evaluated with reassuring abdominal exam. Patient feels improved and comfortable with discharge. Understands importance of going to dialysis tomorrow. Rx for zofran given. Reasons to return to ED discussed. All questions answered.    Ward, Chase PicketJaime Pilcher, PA-C 12/02/16 1008    Lavera GuiseLiu, Dana Duo, MD 12/02/16 [phone]

## 2016-12-02 NOTE — ED Triage Notes (Signed)
Pt from home with abdominal pain and nausea and vomiting. Pt missed dialysis and is scheduled to have it tomorrow. Pt feels like he is fluid over loaded.

## 2016-12-02 NOTE — H&P (Signed)
History and Physical    Jeremy Huerta ZOX:[id number] DOB: 08-May-1996 DOA: 12/02/2016  PCP: Charlotte Sanesobinson, Todd Wayne, MD   Patient coming from: Home  Chief Complaint: abdominal discomfort, N/V  HPI: Jeremy Huerta is a 21 y.o. male with medical history significant for hypertension and end-stage renal disease on hemodialysis, now presenting to the emergency department for evaluation of abdominal discomfort, nausea, and nonbloody nonbilious vomiting. Patient reports that he had been in his usual state of health until approximately 2 days ago when he noted the insidious development of a generalized abdominal discomfort with nausea. Symptoms progressed and nonbloody nonbilious vomiting ensued. Abdominal discomfort is described as generalized, worse across the upper quadrants, constant, severe, nonradiating, and without any alleviating or exacerbating factors identified. Denies any significant diarrhea. Denies melena or hematochezia. Reports experiencing similar symptoms previously. He missed his last dialysis session on 11/30/2016 due to missing his ride. He reports completing dialysis without incident on 11/28/2016. Denies any chest pain or palpitations, reports mild dyspnea, denies cough.   ED Course: Upon arrival to the ED, patient is found to be afebrile, saturating well on room air, hypertensive to 190/120, vitals otherwise stable. EKG features a sinus rhythm with inferolateral T-wave inversions that appears similar to prior. Chemistry panel reveals a potassium of 6.0, bicarbonate of 17, and BUN of 78. Anion gap is 17. CBC is notable for leukocytosis 12,600 and lipase was slightly elevated to 207. Nephrology was consulted by the ED physician and it was advised that the patient be given 30 g of Kayexalate, admitted to medicine, indicating they will plan to dialyze patient in the morning. She was treated with Kayexalate, Zofran, Reglan, and Benadryl in the ED. He remained hypertensive, but otherwise stable,  and will be observed on telemetry unit for ongoing evaluation and management of hyperkalemia with nausea and vomiting in the setting of ESRD and missed dialysis.  Review of Systems:  All other systems reviewed and apart from HPI, are negative.  Past Medical History:  Diagnosis Date  . Acute respiratory failure (HCC)   . ESRD (end stage renal disease) (HCC)   . FSGS (focal segmental glomerulosclerosis)   . Hypertension   . Renal disorder    on dialysis    Past Surgical History:  Procedure Laterality Date  . AV FISTULA PLACEMENT  12/2015  . RENAL BIOPSY  2013     reports that he has never smoked. He has never used smokeless tobacco. He reports that he does not drink alcohol or use drugs.  No Known Allergies  Family History  Problem Relation Age of Onset  . Hypertension Mother   . Diabetes Father   . Hypertension Father   . Kidney disease Father   . Hypertension Maternal Grandmother   . Diabetes Paternal Grandmother   . Hypertension Paternal Grandmother      Prior to Admission medications   Medication Sig Start Date End Date Taking? Authorizing Provider  amLODipine (NORVASC) 10 MG tablet Take 10 mg by mouth daily.  07/15/14   [provider]  B Complex-C-Folic Acid (B COMPLEX-VITAMIN C-FOLIC ACID) 1 MG tablet Take 1 tablet by mouth daily. 04/30/16   [provider]  CATAPRES-TTS-1 0.1 MG/24HR patch Apply 1 patch topically once a week. 10/25/16   [provider]  hydrALAZINE (APRESOLINE) 100 MG tablet Take 100 mg by mouth 3 (three) times daily.  04/13/16   [provider]  isosorbide mononitrate (IMDUR) 60 MG 24 hr tablet Take 60 mg by mouth daily. 12/31/15  [provider]  labetalol (NORMODYNE) 200 MG tablet Take 400 mg by mouth 3 (three) times daily. 03/16/16   [provider]  loperamide (IMODIUM A-D) 2 MG tablet Take 1 tablet (2 mg total) by mouth 4 (four) times daily as needed for diarrhea or loose stools. 02/13/16    Vanetta Mulders, MD  metoCLOPramide (REGLAN) 10 MG tablet Take 1 tablet (10 mg total) by mouth 3 (three) times daily as needed for nausea (headache / nausea). 10/27/16   Everlene Farrier, PA-C  ondansetron (ZOFRAN ODT) 4 MG disintegrating tablet Take 1 tablet (4 mg total) by mouth every 8 (eight) hours as needed for nausea or vomiting. 12/02/16   Ward, Chase Picket, PA-C  SENSIPAR 30 MG tablet Take 30 mg by mouth 4 (four) times daily. 11/12/16   [provider]    Physical Exam: Vitals:   12/02/16 1815 12/02/16 1845 12/02/16 1900 12/02/16 1915  BP: (!) 178/113 (!) 190/124 (!) 185/124 (!) 175/105  Pulse: 95 (!) 111 88 92  Resp: (!) 21 18 (!) 24 19  Temp:      TempSrc:      SpO2: 97% 95% 98% 98%  Weight:      Height:          Constitutional: No acute distress, calm, no pallor, no diaphoresis Eyes: PERTLA, lids and conjunctivae normal ENMT: Mucous membranes are moist. Posterior pharynx clear of any exudate or lesions.   Neck: normal, supple, no masses, no thyromegaly Respiratory: Fine rales at bilateral bases, no wheezing, no rhonchi. Normal respiratory effort.    Cardiovascular: S1 & S2 heard, regular rate and rhythm. No extremity edema. No significant JVD. Abdomen: No distension, no tenderness, no masses palpated. Bowel sounds normal.  Musculoskeletal: no clubbing / cyanosis. No joint deformity upper and lower extremities.    Skin: no significant rashes, lesions, ulcers. Warm, dry, well-perfused. Neurologic: CN 2-12 grossly intact. Sensation intact, DTR normal. Strength 5/5 in all 4 limbs.  Psychiatric: Alert and oriented x 3. Calm.     Labs on Admission: I have personally reviewed following labs and imaging studies  CBC:  Recent Labs Lab 12/02/16 0250 12/02/16 1823  WBC 11.4* 12.6*  NEUTROABS  --  10.5*  HGB 11.2* 14.4  HCT 35.1* 45.0  MCV 89.8 90.2  PLT 314 353   Basic Metabolic Panel:  Recent Labs Lab 12/02/16 0250 12/02/16 1823  NA 137 136  K 5.1  6.0*  CL 103 102  CO2 19* 17*  GLUCOSE 89 81  BUN 67* 78*  CREATININE 20.93* 21.46*  CALCIUM 9.1 9.1   GFR: Estimated Creatinine Clearance: 5.9 mL/min (A) (by C-G formula based on SCr of 21.46 mg/dL (H)). Liver Function Tests:  Recent Labs Lab 12/02/16 0250  AST 15  ALT 14*  ALKPHOS 86  BILITOT 0.8  PROT 7.3  ALBUMIN 4.2    Recent Labs Lab 12/02/16 0250  LIPASE 207*   No results for input(s): AMMONIA in the last 168 hours. Coagulation Profile: No results for input(s): INR, PROTIME in the last 168 hours. Cardiac Enzymes: No results for input(s): CKTOTAL, CKMB, CKMBINDEX, TROPONINI in the last 168 hours. BNP (last 3 results) No results for input(s): PROBNP in the last 8760 hours. HbA1C: No results for input(s): HGBA1C in the last 72 hours. CBG: No results for input(s): GLUCAP in the last 168 hours. Lipid Profile: No results for input(s): CHOL, HDL, LDLCALC, TRIG, CHOLHDL, LDLDIRECT in the last 72 hours. Thyroid Function Tests: No results for input(s): TSH,  T4TOTAL, FREET4, T3FREE, THYROIDAB in the last 72 hours. Anemia Panel: No results for input(s): VITAMINB12, FOLATE, FERRITIN, TIBC, IRON, RETICCTPCT in the last 72 hours. Urine analysis:    Component Value Date/Time   COLORURINE YELLOW 07/20/2014 0114   APPEARANCEUR CLOUDY (A) 07/20/2014 0114   LABSPEC 1.013 07/20/2014 0114   PHURINE 6.0 07/20/2014 0114   GLUCOSEU NEGATIVE 07/20/2014 0114   HGBUR SMALL (A) 07/20/2014 0114   BILIRUBINUR NEGATIVE 07/20/2014 0114   KETONESUR NEGATIVE 07/20/2014 0114   PROTEINUR >300 (A) 07/20/2014 0114   UROBILINOGEN 0.2 07/20/2014 0114   NITRITE NEGATIVE 07/20/2014 0114   LEUKOCYTESUR NEGATIVE 07/20/2014 0114   Sepsis Labs: @LABRCNTIP (procalcitonin:4,lacticidven:4) )No results found for this or any previous visit (from the past 240 hour(s)).   Radiological Exams on Admission: No results found.  EKG: Independently reviewed. Sinus rhythm, inferolateral T-wave  inversions, similar to prior.   Assessment/Plan  1. Hyperkalemia  - Serum potassium is 6.0 on presentation  - Likely secondary to ESRD with missed HD session  - Nephrology is consulting and much appreciated, advised giving 30 g Kayexelate and admitting to medicine for inpatient HD tomorrow  - 30 g Kayexalate ordered, monitor on telemetry, follow serial potassium levels and treat prn while awaiting HD   - Renal diet   2. Hypertension with hypertensive urgency   - BP 190/120 on arrival in setting of missed HD and volume overload  - Continue hydralazine, labetalol, clonidine, and nitrates   - Hydralazine IVP's available prn    3. ESRD - Typically dialyzes TTS, missed session on 7/14 due to transportation issues, reports completing session on 7/12  - Potassium is elevated on admission as above, BUN is 78, bicarbonate is 17, there is no acute respiratory distress or hypoxia  - Nephrology consulting and much appreciated, planning for inpatient HD on 12/03/16  - Renal diet with fluid-restrictions, SLIV, continue Sensipar   4. Nausea, vomiting  - Resolved with Zofran in ED   - Abdominal exam is benign - Continue prn antiemetics    5. Leukocytosis - WBC is 12,600 on admission without fever  - There is nausea and vomiting, possibly representing a vial GE  - Plan to culture if febrile     DVT prophylaxis: sq heparin  Code Status: Full  Family Communication: Discussed with patient Disposition Plan: Observe on telemetry Consults called: Nephrology Admission status: Observation    Briscoe Deutscher, MD [facility] Pager [phone]  If 7PM-7AM, please contact night-coverage [email or web address] Password [email or web address]  12/02/2016, 8:12 PM

## 2016-12-02 NOTE — Discharge Instructions (Signed)
As discussed, make sure that you get to dialysis for your next session on Tuesday.  Avoid smoking and alcohol. Start reintroducing foods slowly starting with liquids, broths and bland foods.  Return to ER if symptoms worsen or you experience new concerning symptoms in the meantime.

## 2016-12-02 NOTE — ED Triage Notes (Signed)
Abd pain , N/V since AM. PT is dialysis pt and missed appointment Saturday.   140/80 Hr 96 rr 18

## 2016-12-02 NOTE — ED Provider Notes (Signed)
[facility]-EMER[facility]ENCY DEPT Provider Note   CSN: [id number] Arrival date & time:        History   Chief Complaint Chief Complaint  Patient presents with  . Abdominal Pain    HPI Jeremy Huerta is a 21 y.o. male   The history is provided by the patient.  Illness  This is a recurrent problem. The current episode started 3 to 5 hours ago. The problem occurs constantly. The problem has been gradually worsening. Associated symptoms include abdominal pain. Pertinent negatives include no chest pain, no headaches and no shortness of breath. Associated symptoms comments: Nausea, vomiting. Exacerbated by: Palpation. The symptoms are relieved by medications.    Past Medical History:  Diagnosis Date  . Acute respiratory failure (HCC)   . ESRD (end stage renal disease) (HCC)   . FSGS (focal segmental glomerulosclerosis)   . Hypertension   . Renal disorder    on dialysis    Patient Active Problem List   Diagnosis Date Noted  . Hypertensive urgency 12/02/2016  . Leukocytosis 12/02/2016  . Nausea & vomiting 12/02/2016  . Hyperkalemia 12/02/2016  . Acute respiratory failure (HCC) 04/22/2016  . Diarrhea 04/22/2016  . HCAP (healthcare-associated pneumonia) 04/22/2016  . Tachycardia 04/22/2016  . Renal disorder   . Hypertension   . ESRD (end stage renal disease) [facility])     Past Surgical History:  Procedure Laterality Date  . AV FISTULA PLACEMENT  12/2015  . RENAL BIOPSY  2013       Home Medications    Prior to Admission medications   Medication Sig Start Date End Date Taking? Authorizing Provider  amLODipine (NORVASC) 10 MG tablet Take 10 mg by mouth daily.  07/15/14  Yes [provider]  cinacalcet (SENSIPAR) 30 MG tablet Take 30 mg by mouth daily.   Yes [provider]  cloNIDine (CATAPRES-TTS-1) 0.1 mg/24hr patch Place 0.1 mg onto the skin once a week.   Yes [provider]  hydrALAZINE (APRESOLINE) 100 MG tablet Take 100 mg by mouth 3 (three) times  daily.  04/13/16  Yes [provider]  labetalol (NORMODYNE) 200 MG tablet Take 400 mg by mouth 3 (three) times daily. 03/16/16  Yes [provider]  sevelamer (RENAGEL) 800 MG tablet Take 2,400 mg by mouth 3 (three) times daily with meals.    Yes [provider]  B Complex-C-Folic Acid (B COMPLEX-VITAMIN C-FOLIC ACID) 1 MG tablet Take 1 tablet by mouth daily. 04/30/16   [provider]  isosorbide mononitrate (IMDUR) 60 MG 24 hr tablet Take 60 mg by mouth daily. 12/31/15   [provider]  loperamide (IMODIUM A-D) 2 MG tablet Take 1 tablet (2 mg total) by mouth 4 (four) times daily as needed for diarrhea or loose stools. 02/13/16   Vanetta Mulders, MD  metoCLOPramide (REGLAN) 10 MG tablet Take 1 tablet (10 mg total) by mouth 3 (three) times daily as needed for nausea (headache / nausea). 10/27/16   Everlene Farrier, PA-C  ondansetron (ZOFRAN ODT) 4 MG disintegrating tablet Take 1 tablet (4 mg total) by mouth every 8 (eight) hours as needed for nausea or vomiting. 12/02/16   Ward, Chase Picket, PA-C    Family History Family History  Problem Relation Age of Onset  . Hypertension Mother   . Diabetes Father   . Hypertension Father   . Kidney disease Father   . Hypertension Maternal Grandmother   . Diabetes Paternal Grandmother   . Hypertension Paternal Grandmother     Social History  Social History  Substance Use Topics  . Smoking status: Never Smoker  . Smokeless tobacco: Never Used  . Alcohol use No     Allergies   Patient has no known allergies.   Review of Systems Review of Systems  Constitutional: Positive for fatigue. Negative for chills and fever.  HENT: Negative for ear pain and sore throat.   Eyes: Negative for pain and visual disturbance.  Respiratory: Negative for cough, chest tightness and shortness of breath.   Cardiovascular: Negative for chest pain and palpitations.  Gastrointestinal: Positive for abdominal pain, nausea  and vomiting. Negative for constipation and diarrhea.  Genitourinary: Negative for dysuria and hematuria.  Musculoskeletal: Negative for arthralgias, back pain and neck pain.  Skin: Negative for color change and rash.  Allergic/Immunologic: Negative for immunocompromised state.  Neurological: Negative for seizures, syncope and headaches.  Psychiatric/Behavioral: Negative for agitation and behavioral problems.  All other systems reviewed and are negative.    Physical Exam Updated Vital Signs BP (!) 181/103 (BP Location: Right Arm)   Pulse 92   Temp 98 F (36.7 C) (Oral)   Resp 20   Ht 6' (1.829 m)   Wt 90.6 kg (199 lb 11.2 oz)   SpO2 100%   BMI 27.08 kg/m   Physical Exam  Constitutional: He is oriented to person, place, and time. He appears well-developed and well-nourished.  HENT:  Head: Normocephalic and atraumatic.  Mouth/Throat: Oropharynx is clear and moist.  Eyes: Pupils are equal, round, and reactive to light. Conjunctivae and EOM are normal.  Neck: Normal range of motion. No tracheal deviation present.  Cardiovascular: Normal rate, regular rhythm and intact distal pulses.   Pulmonary/Chest: Effort normal. No respiratory distress. He has no wheezes.  Abdominal: Soft. Bowel sounds are normal. He exhibits no distension. There is tenderness (diffuse). There is no rebound and no guarding.  Musculoskeletal: He exhibits no tenderness or deformity.  Neurological: He is alert and oriented to person, place, and time. No cranial nerve deficit.  Skin: Skin is warm and dry.  Psychiatric: He has a normal mood and affect.     ED Treatments / Results  Labs (all labs ordered are listed, but only abnormal results are displayed) Labs Reviewed  BASIC METABOLIC PANEL - Abnormal; Notable for the following:       Result Value   Potassium 6.0 (*)    CO2 17 (*)    BUN 78 (*)    Creatinine, Ser 21.46 (*)    GFR calc non Af Amer 3 (*)    GFR calc Af Amer 3 (*)    Anion gap 17 (*)     All other components within normal limits  CBC WITH DIFFERENTIAL/PLATELET - Abnormal; Notable for the following:    WBC 12.6 (*)    RDW 15.9 (*)    Neutro Abs 10.5 (*)    All other components within normal limits  POTASSIUM - Abnormal; Notable for the following:    Potassium 6.1 (*)    All other components within normal limits  POTASSIUM  CBC WITH DIFFERENTIAL/PLATELET  BASIC METABOLIC PANEL  POTASSIUM  POTASSIUM  POTASSIUM  POTASSIUM    EKG  EKG Interpretation  Date/Time:  Monday December 02 2016 16:52:29 EDT Ventricular Rate:  91 PR Interval:    QRS Duration: 85 QT Interval:  354 QTC Calculation: 436 R Axis:   87 Text Interpretation:  Sinus rhythm Borderline T abnormalities, inferior leads T waves more peaked than eariler today Confirmed by Jeremy Huerta [phone])  on 12/02/2016 7:30:44 PM       Radiology No results found.  Procedures Procedures (including critical care time)  Medications Ordered in ED Medications  cloNIDine (CATAPRES - Dosed in mg/24 hr) patch 0.1 mg (0.1 mg Transdermal Patch Applied 12/02/16 2113)  cinacalcet (SENSIPAR) tablet 30 mg (30 mg Oral Given 12/02/16 2324)  metoCLOPramide (REGLAN) tablet 10 mg (not administered)  hydrALAZINE (APRESOLINE) tablet 100 mg (100 mg Oral Given 12/02/16 2039)  labetalol (NORMODYNE) tablet 400 mg (400 mg Oral Given 12/02/16 2039)  isosorbide mononitrate (IMDUR) 24 hr tablet 60 mg (not administered)  amLODipine (NORVASC) tablet 10 mg (not administered)  heparin injection 5,000 Units (5,000 Units Subcutaneous Not Given 12/02/16 2322)  sodium chloride flush (NS) 0.9 % injection 3 mL (not administered)  sodium chloride flush (NS) 0.9 % injection 3 mL (not administered)  sodium chloride flush (NS) 0.9 % injection 3 mL (not administered)  0.9 %  sodium chloride infusion (not administered)  acetaminophen (TYLENOL) tablet 650 mg (not administered)    Or  acetaminophen (TYLENOL) suppository 650 mg (not administered)    HYDROcodone-acetaminophen (NORCO/VICODIN) 5-325 MG per tablet 1-2 tablet (1 tablet Oral Given 12/02/16 2150)  ondansetron (ZOFRAN) tablet 4 mg (not administered)    Or  ondansetron (ZOFRAN) injection 4 mg (not administered)  hydrALAZINE (APRESOLINE) injection 10 mg (not administered)  ondansetron (ZOFRAN) injection 4 mg (4 mg Intravenous Given 12/02/16 1725)  metoCLOPramide (REGLAN) injection 10 mg (10 mg Intravenous Given 12/02/16 1807)  diphenhydrAMINE (BENADRYL) injection 25 mg (25 mg Intravenous Given 12/02/16 1807)  sodium polystyrene (KAYEXALATE) 15 GM/60ML suspension 30 g (30 g Oral Given 12/02/16 2038)     Initial Impression / Assessment and Plan / ED Course  I have reviewed the triage vital signs and the nursing notes.  Pertinent labs & imaging results that were available during my care of the patient were reviewed by me and considered in my medical decision making (see chart for details).     Patient presenting today with nausea, vomiting, abdominal pain and generalized fatigue. York SpanielSaid he was discharges morning from the hospital and felt better however since then has had worsening abdominal pain with nausea and vomiting. Says he missed his dialysis on Saturday because he missed his ride. Last dialysis session was on Thursday. Denies chest pain or shortness of breath.   Hypertensive on exam with diffuse abdominal tenderness. Other vital signs stable and within normal limits. EKG does show some larger T waves than his previous EKG this morning. Potassium at 6, increased from 5.1 earlier today. Spoke with nephrology on the phone who recommended inpatient admission and 30 g Kayexalate, and they will dialyze him tomorrow. Patient will be admitted to the hospitalist service for further evaluation and management. Nausea controlled with IV Zofran, Reglan/Benadryl.  Patient was seen with my attending, Dr. Clayborne DanaMesner, who voiced agreement and oversaw the evaluation and treatment of this patient.    Dr[employer] illustratorvoice dictation software was used in the creation of this note. If there are any errors or inconsistencies needing clarification, please contact me directly.   Final Clinical Impressions(s) / ED Diagnoses   Final diagnoses:  Nausea and vomiting, intractability of vomiting not specified, unspecified vomiting type    New Prescriptions Current Discharge Medication List       Jeremy Huerta, Jeremy Smisek, MD 12/02/16 [id number]    Jeremy MemosMesner, Jason, MD 12/03/16 1715

## 2016-12-02 NOTE — ED Provider Notes (Signed)
[facility] DEPT Provider Note   CSN: [id number] Arrival date & time: 12/02/16  0215     History   Chief Complaint Chief Complaint  Patient presents with  . Abdominal Pain    HPI Jeremy Huerta is a 21 y.o. male with end-stage renal disease on dialysis presenting after missing dialysis yesterday. He reports nausea, vomiting and diarrhea for 2 days and diffuse abdominal discomfort. Denies fever, chills, or other symptoms.  HPI  Past Medical History:  Diagnosis Date  . Acute respiratory failure (HCC)   . ESRD (end stage renal disease) (HCC)   . FSGS (focal segmental glomerulosclerosis)   . Hypertension   . Renal disorder    on dialysis    Patient Active Problem List   Diagnosis Date Noted  . Acute respiratory failure (HCC) 04/22/2016  . Diarrhea 04/22/2016  . HCAP (healthcare-associated pneumonia) 04/22/2016  . Tachycardia 04/22/2016  . Renal disorder   . Hypertension   . ESRD (end stage renal disease) (HCC)   . Essential hypertension     Past Surgical History:  Procedure Laterality Date  . AV FISTULA PLACEMENT  12/2015  . RENAL BIOPSY  2013       Home Medications    Prior to Admission medications   Medication Sig Start Date End Date Taking? Authorizing Provider  amLODipine (NORVASC) 10 MG tablet Take 10 mg by mouth daily.  07/15/14  Yes [provider]  B Complex-C-Folic Acid (B COMPLEX-VITAMIN C-FOLIC ACID) 1 MG tablet Take 1 tablet by mouth daily. 04/30/16  Yes [provider]  CATAPRES-TTS-1 0.1 MG/24HR patch Apply 1 patch topically once a week. 10/25/16  Yes [provider]  hydrALAZINE (APRESOLINE) 100 MG tablet Take 100 mg by mouth 3 (three) times daily.  04/13/16  Yes [provider]  isosorbide mononitrate (IMDUR) 60 MG 24 hr tablet Take 60 mg by mouth daily. 12/31/15  Yes [provider]  labetalol (NORMODYNE) 200 MG tablet Take 400 mg by mouth 3 (three) times daily. 03/16/16  Yes [provider]   loperamide (IMODIUM A-D) 2 MG tablet Take 1 tablet (2 mg total) by mouth 4 (four) times daily as needed for diarrhea or loose stools. 02/13/16  Yes Vanetta Mulders, MD  metoCLOPramide (REGLAN) 10 MG tablet Take 1 tablet (10 mg total) by mouth 3 (three) times daily as needed for nausea (headache / nausea). 10/27/16  Yes Everlene Farrier, PA-C  ondansetron (ZOFRAN ODT) 4 MG disintegrating tablet Take 1 tablet (4 mg total) by mouth every 8 (eight) hours as needed for nausea or vomiting. 02/13/16  Yes Vanetta Mulders, MD  SENSIPAR 30 MG tablet Take 30 mg by mouth 4 (four) times daily. 11/12/16  Yes [provider]  saccharomyces boulardii (FLORASTOR) 250 MG capsule Take 1 capsule (250 mg total) by mouth 2 (two) times daily. Patient not taking: Reported on 11/19/2016 04/24/16   Rolly Salter, MD    Family History Family History  Problem Relation Age of Onset  . Hypertension Mother   . Diabetes Father   . Hypertension Father   . Kidney disease Father   . Hypertension Maternal Grandmother   . Diabetes Paternal Grandmother   . Hypertension Paternal Grandmother     Social History Social History  Substance Use Topics  . Smoking status: Never Smoker  . Smokeless tobacco: Never Used  . Alcohol use No     Allergies   Patient has no known allergies.   Review of Systems Review of Systems  Constitutional: Negative  for chills and fever.  HENT: Negative for ear pain and sore throat.   Eyes: Negative for pain and visual disturbance.  Respiratory: Negative for cough, shortness of breath, wheezing and stridor.   Cardiovascular: Negative for chest pain, palpitations and leg swelling.  Gastrointestinal: Positive for abdominal pain, diarrhea, nausea and vomiting. Negative for blood in stool.  Genitourinary: Negative for dysuria and hematuria.  Musculoskeletal: Negative for arthralgias, back pain, myalgias, neck pain and neck stiffness.  Skin: Negative for color change, pallor and rash.    Neurological: Negative for dizziness, seizures, syncope, weakness and light-headedness.     Physical Exam Updated Vital Signs BP (!) 171/107   Pulse 94   Temp 98.1 F (36.7 C) (Oral)   Resp (!) 24   Wt 90.7 kg (200 lb)   SpO2 97%   BMI 27.12 kg/m   Physical Exam  Constitutional: He appears well-developed and well-nourished. No distress.  Afebrile, nontoxic-appearing, lying in bed in discomfort  HENT:  Head: Normocephalic and atraumatic.  Eyes: Pupils are equal, round, and reactive to light. Conjunctivae and EOM are normal.  Neck: Normal range of motion. Neck supple.  Cardiovascular: Normal rate and regular rhythm.   Murmur heard. Soft systolic murmur  Pulmonary/Chest: Effort normal and breath sounds normal. No respiratory distress. He has no wheezes. He has no rales.  Abdominal: Soft. He exhibits distension. He exhibits no mass. There is tenderness. There is no rebound and no guarding.  Mild tenderness to palpation across the lower abdomen bilaterally  Musculoskeletal: Normal range of motion. He exhibits no edema, tenderness or deformity.  Neurological: He is alert.  Skin: Skin is warm and dry. He is not diaphoretic. No erythema. No pallor.  Psychiatric: He has a normal mood and affect.  Nursing note and vitals reviewed.    ED Treatments / Results  Labs (all labs ordered are listed, but only abnormal results are displayed) Labs Reviewed  LIPASE, BLOOD - Abnormal; Notable for the following:       Result Value   Lipase 207 (*)    All other components within normal limits  COMPREHENSIVE METABOLIC PANEL - Abnormal; Notable for the following:    CO2 19 (*)    BUN 67 (*)    Creatinine, Ser 20.93 (*)    ALT 14 (*)    GFR calc non Af Amer 3 (*)    GFR calc Af Amer 3 (*)    All other components within normal limits  CBC - Abnormal; Notable for the following:    WBC 11.4 (*)    RBC 3.91 (*)    Hemoglobin 11.2 (*)    HCT 35.1 (*)    RDW 15.6 (*)    All other  components within normal limits    EKG  EKG Interpretation  Date/Time:  Monday December 02 2016 02:27:10 EDT Ventricular Rate:  105 PR Interval:  174 QRS Duration: 68 QT Interval:  324 QTC Calculation: 428 R Axis:   104 Text Interpretation:  Sinus tachycardia Rightward axis T wave abnormality, consider lateral ischemia Abnormal ECG When compared with ECG of 11/19/2016, No significant change was found Confirmed by Dione Booze ([id number]) on 12/02/2016 5:15:09 AM       Radiology No results found.  Procedures Procedures (including critical care time)  Medications Ordered in ED Medications  sodium chloride 0.9 % bolus 500 mL (not administered)  morphine 4 MG/ML injection 4 mg (not administered)  loperamide (IMODIUM) capsule 4 mg (not administered)  promethazine (PHENERGAN) injection 12.5  mg (not administered)  ondansetron (ZOFRAN) injection 4 mg (4 mg Intravenous Given 12/02/16 0422)     Initial Impression / Assessment and Plan / ED Course  I have reviewed the triage vital signs and the nursing notes.  Pertinent labs & imaging results that were available during my care of the patient were reviewed by me and considered in my medical decision making (see chart for details).     Patient with end-stage renal disease presenting after missing dialysis yesterday (Saturday). He reports nausea/vomiting/diarrhea since Friday with diffuse abdominal discomfort. No focal pain.   Workup showing slightly elevated lipase at 207. No focal tenderness to palpation of the abdomen. Abdomen is mildly distended but soft, no peritoneal signs.  Patient has been resting comfortably in bed and is in no acute distress. Plan to gently rehydrate and treat symptomatically.  Patient was discussed with Dr. Preston FleetingGlick who has seen patient and agrees with assessment and plan.  Patient will be dialyzed on Tuesday as scheduled. On reassessment, patient reported still feeling nauseated. Ordered Phenergan Patient was stable  and resting comfortably.  [facility] pending completion of fluid bolus and PO challenge with plan to discharge home with symptomatic relief once completed.  Final Clinical Impressions(s) / ED Diagnoses   Final diagnoses:  Nausea vomiting and diarrhea    New Prescriptions New Prescriptions   No medications on file     Gregary CromerMitchell, Connelly [place] B, PA-C 12/02/16 [id number]    Dione BoozeGlick, David, MD 12/02/16 [phone]

## 2016-12-03 ENCOUNTER — Encounter (HOSPITAL_COMMUNITY): Payer: Self-pay | Admitting: General Practice

## 2016-12-03 DIAGNOSIS — N2581 Secondary hyperparathyroidism of renal origin: Secondary | ICD-10-CM | POA: Diagnosis present

## 2016-12-03 DIAGNOSIS — N186 End stage renal disease: Secondary | ICD-10-CM | POA: Diagnosis present

## 2016-12-03 DIAGNOSIS — I160 Hypertensive urgency: Secondary | ICD-10-CM

## 2016-12-03 DIAGNOSIS — E875 Hyperkalemia: Principal | ICD-10-CM

## 2016-12-03 DIAGNOSIS — Z8249 Family history of ischemic heart disease and other diseases of the circulatory system: Secondary | ICD-10-CM | POA: Diagnosis not present

## 2016-12-03 DIAGNOSIS — E8889 Other specified metabolic disorders: Secondary | ICD-10-CM | POA: Diagnosis present

## 2016-12-03 DIAGNOSIS — Z992 Dependence on renal dialysis: Secondary | ICD-10-CM | POA: Diagnosis not present

## 2016-12-03 DIAGNOSIS — I120 Hypertensive chronic kidney disease with stage 5 chronic kidney disease or end stage renal disease: Secondary | ICD-10-CM | POA: Diagnosis present

## 2016-12-03 DIAGNOSIS — Z833 Family history of diabetes mellitus: Secondary | ICD-10-CM | POA: Diagnosis not present

## 2016-12-03 DIAGNOSIS — R112 Nausea with vomiting, unspecified: Secondary | ICD-10-CM

## 2016-12-03 DIAGNOSIS — E8770 Fluid overload, unspecified: Secondary | ICD-10-CM | POA: Diagnosis present

## 2016-12-03 DIAGNOSIS — D649 Anemia, unspecified: Secondary | ICD-10-CM | POA: Diagnosis present

## 2016-12-03 LAB — CBC WITH DIFFERENTIAL/PLATELET
BASOS ABS: 0.1 10*3/uL (ref 0.0–0.1)
BASOS PCT: 1 %
EOS ABS: 0 10*3/uL (ref 0.0–0.7)
EOS PCT: 0 %
HCT: 48.5 % (ref 39.0–52.0)
Hemoglobin: 16.2 g/dL (ref 13.0–17.0)
Lymphocytes Relative: 7 %
Lymphs Abs: 0.8 10*3/uL (ref 0.7–4.0)
MCH: 29.5 pg (ref 26.0–34.0)
MCHC: 33.4 g/dL (ref 30.0–36.0)
MCV: 88.2 fL (ref 78.0–100.0)
MONOS PCT: 2 %
Monocytes Absolute: 0.2 10*3/uL (ref 0.1–1.0)
Neutro Abs: 10.2 10*3/uL — ABNORMAL HIGH (ref 1.7–7.7)
Neutrophils Relative %: 90 %
PLATELETS: 358 10*3/uL (ref 150–400)
RBC: 5.5 MIL/uL (ref 4.22–5.81)
RDW: 15.8 % — AB (ref 11.5–15.5)
WBC: 11.4 10*3/uL — ABNORMAL HIGH (ref 4.0–10.5)

## 2016-12-03 LAB — BASIC METABOLIC PANEL
ANION GAP: 17 — AB (ref 5–15)
BUN: 88 mg/dL — ABNORMAL HIGH (ref 6–20)
CALCIUM: 9 mg/dL (ref 8.9–10.3)
CO2: 17 mmol/L — AB (ref 22–32)
CREATININE: 21.29 mg/dL — AB (ref 0.61–1.24)
Chloride: 102 mmol/L (ref 101–111)
GFR, EST AFRICAN AMERICAN: 3 mL/min — AB (ref >60)
GFR, EST NON AFRICAN AMERICAN: 3 mL/min — AB (ref >60)
Glucose, Bld: 107 mg/dL — ABNORMAL HIGH (ref 65–99)
Potassium: 6.2 mmol/L — ABNORMAL HIGH (ref 3.5–5.1)
Sodium: 136 mmol/L (ref 135–145)

## 2016-12-03 LAB — MRSA PCR SCREENING: MRSA by PCR: NEGATIVE

## 2016-12-03 LAB — POTASSIUM
POTASSIUM: 4.2 mmol/L (ref 3.5–5.1)
Potassium: 6 mmol/L — ABNORMAL HIGH (ref 3.5–5.1)
Potassium: 3.8 mmol/L (ref 3.5–5.1)

## 2016-12-03 LAB — GLUCOSE, CAPILLARY
Glucose-Capillary: 90 mg/dL (ref 65–99)
Glucose-Capillary: 112 mg/dL — ABNORMAL HIGH (ref 65–99)

## 2016-12-03 MED ORDER — DOXERCALCIFEROL 4 MCG/2ML IV SOLN
6.0000 ug | INTRAVENOUS | Status: DC
  Administered 2016-12-03: 6 ug via INTRAVENOUS
  Filled 2016-12-03: qty 4

## 2016-12-03 MED ORDER — SODIUM CHLORIDE 0.9 % IV SOLN: 100.0000 mL | INTRAVENOUS | Status: DC | PRN

## 2016-12-03 MED ORDER — LIDOCAINE HCL (PF) 1 % IJ SOLN: 5.0000 mL | INTRAMUSCULAR | Status: DC | PRN

## 2016-12-03 MED ORDER — RENA-VITE PO TABS
1.0000 | ORAL_TABLET | Freq: Every day | ORAL | Status: DC
  Administered 2016-12-03: 1 via ORAL
  Filled 2016-12-03: qty 1

## 2016-12-03 MED ORDER — HEPARIN SODIUM (PORCINE) 1000 UNIT/ML DIALYSIS
5000.0000 [IU] | INTRAMUSCULAR | Status: DC | PRN
  Filled 2016-12-03: qty 5

## 2016-12-03 MED ORDER — HEPARIN SODIUM (PORCINE) 1000 UNIT/ML DIALYSIS: 1000.0000 [IU] | INTRAMUSCULAR | Status: DC | PRN

## 2016-12-03 MED ORDER — PENTAFLUOROPROP-TETRAFLUOROETH EX AERO: 1.0000 "application " | INHALATION_SPRAY | CUTANEOUS | Status: DC | PRN

## 2016-12-03 MED ORDER — DOXERCALCIFEROL 4 MCG/2ML IV SOLN
INTRAVENOUS | Status: AC
  Filled 2016-12-03: qty 4

## 2016-12-03 MED ORDER — HYDRALAZINE HCL 20 MG/ML IJ SOLN
10.0000 mg | Freq: Once | INTRAMUSCULAR | Status: AC
  Administered 2016-12-03: 10 mg via INTRAVENOUS

## 2016-12-03 MED ORDER — LIDOCAINE-PRILOCAINE 2.5-2.5 % EX CREA: 1.0000 "application " | TOPICAL_CREAM | CUTANEOUS | Status: DC | PRN

## 2016-12-03 MED ORDER — SEVELAMER CARBONATE 800 MG PO TABS
2400.0000 mg | ORAL_TABLET | Freq: Three times a day (TID) | ORAL | Status: DC
  Administered 2016-12-03 – 2016-12-04 (×3): 2400 mg via ORAL
  Filled 2016-12-03 (×3): qty 3

## 2016-12-03 MED ORDER — CINACALCET HCL 30 MG PO TABS: 60.0000 mg | ORAL_TABLET | Freq: Every day | ORAL | Status: DC

## 2016-12-03 NOTE — Consult Note (Signed)
[facility] Renal Consultation Note    Indication for Consultation:  Management of ESRD/hemodialysis; anemia, hypertension/volume and secondary hyperparathyroidism  HPI: Jeremy Huerta is a 21 y.o. 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 PMH also includes severe HTN, h/o pancreatitis. Admitted under observation status with 2 day history of abdominal pain, nausea, vomiting, diarrhea. Labs significant for  WBC 11.4 K 6.0 CO2 17 BUN 78 Cr 21.46 and elevated lipase 207. Hypertensive on admission with BP 190/105. We are asked to seefor dialysis needs.  Seen in room lying in bed. No vomiting this am, but still having diarrhea, abdominal cramping and loss of appetite. Denies fever, chills,  chest pain, dyspnea.  Dialyzes at [facility]. Last HD was Thursday 7/12 for 3:17. Missed Saturday he says because he missed his transport. Has missed some treatments in the past for similar reasons. BP typically elevated on HD, has been getting near EDW usually 0.6kg over  Past Medical History:  Diagnosis Date  . Acute respiratory failure (HCC)   . ESRD (end stage renal disease) (HCC)   . FSGS (focal segmental glomerulosclerosis)   . Hypertension   . Nausea & vomiting 11/2016  . Renal disorder    on dialysis   Past Surgical History:  Procedure Laterality Date  . AV FISTULA PLACEMENT  12/2015  . RENAL BIOPSY  2013   Family History  Problem Relation Age of Onset  . Hypertension Mother   . Diabetes Father   . Hypertension Father   . Kidney disease Father   . Hypertension Maternal Grandmother   . Diabetes Paternal Grandmother   . Hypertension Paternal Grandmother    Social History:  reports that he has never smoked. He has never used smokeless tobacco. He reports that he does not drink alcohol or use drugs. No Known Allergies Prior to Admission medications   Medication Sig Start Date End Date Taking? Authorizing Provider  amLODipine (NORVASC) 10 MG  tablet Take 10 mg by mouth daily.  07/15/14  Yes [provider]  cinacalcet (SENSIPAR) 30 MG tablet Take 30 mg by mouth daily.   Yes [provider]  cloNIDine (CATAPRES-TTS-1) 0.1 mg/24hr patch Place 0.1 mg onto the skin once a week.   Yes [provider]  hydrALAZINE (APRESOLINE) 100 MG tablet Take 100 mg by mouth 3 (three) times daily.  04/13/16  Yes [provider]  labetalol (NORMODYNE) 200 MG tablet Take 400 mg by mouth 3 (three) times daily. 03/16/16  Yes [provider]  sevelamer (RENAGEL) 800 MG tablet Take 2,400 mg by mouth 3 (three) times daily with meals.    Yes [provider]  B Complex-C-Folic Acid (B COMPLEX-VITAMIN C-FOLIC ACID) 1 MG tablet Take 1 tablet by mouth daily. 04/30/16   [provider]  isosorbide mononitrate (IMDUR) 60 MG 24 hr tablet Take 60 mg by mouth daily. 12/31/15   [provider]  loperamide (IMODIUM A-D) 2 MG tablet Take 1 tablet (2 mg total) by mouth 4 (four) times daily as needed for diarrhea or loose stools. 02/13/16   Vanetta Mulders, MD  metoCLOPramide (REGLAN) 10 MG tablet Take 1 tablet (10 mg total) by mouth 3 (three) times daily as needed for nausea (headache / nausea). 10/27/16   Everlene Farrier, PA-C  ondansetron (ZOFRAN ODT) 4 MG disintegrating tablet Take 1 tablet (4 mg total) by mouth every 8 (eight) hours as needed for nausea or vomiting. 12/02/16   Ward, Chase Picket, PA-C  Current Facility-Administered Medications  Medication Dose Route Frequency Provider Last Rate Last Dose  . 0.9 %  sodium chloride infusion  250 mL Intravenous PRN Opyd, Lavone Neri, MD      . acetaminophen (TYLENOL) tablet 650 mg  650 mg Oral Q6H PRN Opyd, Lavone Neri, MD       Or  . acetaminophen (TYLENOL) suppository 650 mg  650 mg Rectal Q6H PRN Opyd, Lavone Neri, MD      . amLODipine (NORVASC) tablet 10 mg  10 mg Oral Daily Opyd, Lavone Neri, MD      . cinacalcet (SENSIPAR) tablet 30 mg  30 mg Oral TID WC &  HS Opyd, Lavone Neri, MD   30 mg at 12/03/16 1116  . cloNIDine (CATAPRES - Dosed in mg/24 hr) patch 0.1 mg  0.1 mg Transdermal Weekly Opyd, Lavone Neri, MD   0.1 mg at 12/02/16 2113  . heparin injection 5,000 Units  5,000 Units Subcutaneous Q8H Opyd, Lavone Neri, MD      . hydrALAZINE (APRESOLINE) injection 10 mg  10 mg Intravenous Q4H PRN Opyd, Lavone Neri, MD   10 mg at 12/03/16 1116  . hydrALAZINE (APRESOLINE) tablet 100 mg  100 mg Oral TID Briscoe Deutscher, MD   100 mg at 12/02/16 2039  . HYDROcodone-acetaminophen (NORCO/VICODIN) 5-325 MG per tablet 1-2 tablet  1-2 tablet Oral Q4H PRN Opyd, Lavone Neri, MD   1 tablet at 12/03/16 0533  . isosorbide mononitrate (IMDUR) 24 hr tablet 60 mg  60 mg Oral Daily Opyd, Lavone Neri, MD      . labetalol (NORMODYNE) tablet 400 mg  400 mg Oral TID Briscoe Deutscher, MD   400 mg at 12/02/16 2039  . metoCLOPramide (REGLAN) tablet 10 mg  10 mg Oral TID PRN Briscoe Deutscher, MD   10 mg at 12/03/16 0533  . ondansetron (ZOFRAN) tablet 4 mg  4 mg Oral Q6H PRN Opyd, Lavone Neri, MD       Or  . ondansetron (ZOFRAN) injection 4 mg  4 mg Intravenous Q6H PRN Opyd, Lavone Neri, MD   4 mg at 12/03/16 0841  . sodium chloride flush (NS) 0.9 % injection 3 mL  3 mL Intravenous Q12H Opyd, Timothy S, MD      . sodium chloride flush (NS) 0.9 % injection 3 mL  3 mL Intravenous Q12H Opyd, Lavone Neri, MD   3 mL at 12/03/16 0844  . sodium chloride flush (NS) 0.9 % injection 3 mL  3 mL Intravenous PRN Opyd, Lavone Neri, MD        ROS: As per HPI otherwise negative.  Physical Exam: Vitals:   12/02/16 2209 12/03/16 0428 12/03/16 0845 12/03/16 1116  BP: (!) 181/103 (!) 190/105 (!) 186/115 (!) 167/103  Pulse: 92 86 84   Resp: 20 19 20    Temp: 98 F (36.7 C) 97.8 F (36.6 C) 98.6 F (37 C)   TempSrc: Oral Oral Oral   SpO2: 100% 100% 98%   Weight: 90.6 kg (199 lb 11.2 oz)     Height:         General: WDWN AAM NAD  Head: NCAT sclera not icteric MMM Neck: Supple. No JVD No masses Lungs:   Breathing is unlabored. CTAB  Heart: RRR with S1 S2 Abdomen: soft NT + BS Lower extremities:without edema or ischemic changes, no open wounds  Neuro: A & O  X 3. Moves all extremities spontaneously. Psych:  Responds to questions appropriately with a normal affect. Dialysis  Access: LUE AVF +bruit   Labs: Basic Metabolic Panel:  Recent Labs Lab 12/02/16 0250 12/02/16 1823 12/02/16 2255 12/03/16 0234  NA 137 136  --  136  K 5.1 6.0* 6.1* 6.2*  CL 103 102  --  102  CO2 19* 17*  --  17*  GLUCOSE 89 81  --  107*  BUN 67* 78*  --  88*  CREATININE 20.93* 21.46*  --  21.29*  CALCIUM 9.1 9.1  --  9.0   Liver Function Tests:  Recent Labs Lab 12/02/16 0250  AST 15  ALT 14*  ALKPHOS 86  BILITOT 0.8  PROT 7.3  ALBUMIN 4.2    Recent Labs Lab 12/02/16 0250  LIPASE 207*   No results for input(s): AMMONIA in the last 168 hours. CBC:  Recent Labs Lab 12/02/16 0250 12/02/16 1823 12/03/16 0234  WBC 11.4* 12.6* 11.4*  NEUTROABS  --  10.5* 10.2*  HGB 11.2* 14.4 16.2  HCT 35.1* 45.0 48.5  MCV 89.8 90.2 88.2  PLT 314 353 358   Cardiac Enzymes: No results for input(s): CKTOTAL, CKMB, CKMBINDEX, TROPONINI in the last 168 hours. CBG: No results for input(s): GLUCAP in the last 168 hours. Iron Studies: No results for input(s): IRON, TIBC, TRANSFERRIN, FERRITIN in the last 72 hours. Studies/Results: No results found.  Dialysis Orders:  [facility] TTS 4h 180F BFR 400/800 2K/2.25 Ca EDW 88kg  L AVF -Heparin 4600 -Hectorol 6 mcg IV q HD -Mircera 50mcg IV q 2wks (7/12) -Venofer 100mg  IV x10 ( has not started yet)   Assessment/Plan: 1. Abdominal pain N/V/D - improving with GI meds - w/u per primary 2. Hyperkalemia - K 6.2 - Correct with HD today  3. ESRD -  TTS - Missed HD Saturday. K 6.2  CO2 17 BUN 78 Cr 21.46 For HD today  4. Hypertension/volume  - BP chronically elevated. Continue home meds( labetalol, clonidine hydralazine, amlodipine)/Should improve with HD - UF goal 4L  today  5. Anemia  - Hgb 11.2> 16.2? - No ESA needs, follow trend  6. Metabolic bone disease -  Outpatient P 6.4 PTH 1306 - Continue Hectorol/ Renvela binder/Sensipar 7. Nutrition - Renal diet/vitamins   Tomasa Blasegechi Grace Navneet Schmuck PA-C [facility] Pager [phone] 12/03/2016, 11:41 AM

## 2016-12-03 NOTE — Plan of Care (Signed)
Problem: Fluid Volume: Goal: Ability to maintain a balanced intake and output will improve Outcome: Progressing Education on fluid restriction reinforced

## 2016-12-03 NOTE — Plan of Care (Signed)
Problem: Education: Goal: Knowledge of [facility] Education information/materials will improve Outcome: Progressing POC and orders reviewed with pt.   

## 2016-12-03 NOTE — Progress Notes (Signed)
PROGRESS NOTE  Jeremy Huerta NFA:[id number] DOB: 26-May-1995 DOA: 12/02/2016 PCP: [name], MD   LOS: 0 days   Brief Narrative / Interim history: 21 year old male with poorly controlled hypertension, end-stage renal disease due to FSGS on hemodialysis, presented to the emergency department with abdominal pain, nausea and vomiting.  This is been going on for the past couple of days.  Of note, he missed his dialysis 3 days ago.  Assessment & Plan: Principal Problem:   Hyperkalemia Active Problems:   Hypertension   ESRD (end stage renal disease) (HCC)   Hypertensive urgency   Leukocytosis   Nausea & vomiting   Abdominal pain, nausea and vomiting -Patient has been having similar symptoms for the past year.  I have reviewed medical records from [facility].  He has been evaluated extensively at [facility] with repeat imaging, upper endoscopy as well as colonoscopy.  Biopsy showed a nonspecific enteritis without a clear cause for his symptoms.  He tells me today that sometimes he gets his symptoms when he misses his hemodialysis.  He missed his ride on Saturday and did not get to go to hemodialysis -We will provide supportive treatment, HE today per nephrology, pain control, antiemetics, advance diet as tolerated  End-stage renal disease with hyperkalemia -Nephrology consulted, will undergo HE today, received Kayexalate last night the emergency room  Hypertensive urgency -Patient with chronically elevated blood pressure in the 160s 170s as an outpatient, no slightly higher than the due to missed dialysis.  Will monitor closely after dialysis   DVT prophylaxis: Heparin Code Status: Full code Family Communication: No family at bedside Disposition Plan: Home when ready  Consultants:   Nephrology  Procedures:   None   Antimicrobials:  None    Subjective: -Poorly communicative, does not make eye contact and answers questions with yes/no.  Says he has no  abdominal pain, nausea, no chest pain or shortness of breath  Objective: Vitals:   12/02/16 2209 12/03/16 0428 12/03/16 0845 12/03/16 1116  BP: (!) 181/103 (!) 190/105 (!) 186/115 (!) 167/103  Pulse: 92 86 84   Resp: 20 19 20    Temp: 98 F (36.7 C) 97.8 F (36.6 C) 98.6 F (37 C)   TempSrc: Oral Oral Oral   SpO2: 100% 100% 98%   Weight: 90.6 kg (199 lb 11.2 oz)     Height:        Intake/Output Summary (Last 24 hours) at 12/03/16 1219 Last data filed at 12/03/16 0900  Gross per 24 hour  Intake              240 ml  Output                0 ml  Net              240 ml   Filed Weights   12/02/16 1649 12/02/16 2209  Weight: 88.5 kg (195 lb) 90.6 kg (199 lb 11.2 oz)    Examination:  Vitals:   12/02/16 2209 12/03/16 0428 12/03/16 0845 12/03/16 1116  BP: (!) 181/103 (!) 190/105 (!) 186/115 (!) 167/103  Pulse: 92 86 84   Resp: 20 19 20    Temp: 98 F (36.7 C) 97.8 F (36.6 C) 98.6 F (37 C)   TempSrc: Oral Oral Oral   SpO2: 100% 100% 98%   Weight: 90.6 kg (199 lb 11.2 oz)     Height:        Constitutional: NAD Eyes: lids and conjunctivae normal Respiratory: clear to  auscultation bilaterally, no wheezing, no crackles. Normal respiratory effort. No accessory muscle use.  Cardiovascular: Regular rate and rhythm, no murmurs / rubs / gallops.  Abdomen: Mild diffuse tenderness to palpation, no guarding, no rebound Neurologic: CN 2-12 grossly intact. Strength 5/5 in all 4.  Psychiatric: Alert and oriented 3   Data Reviewed: I have independently reviewed following labs and imaging studies   CBC:  Recent Labs Lab 12/02/16 0250 12/02/16 1823 12/03/16 0234  WBC 11.4* 12.6* 11.4*  NEUTROABS  --  10.5* 10.2*  HGB 11.2* 14.4 16.2  HCT 35.1* 45.0 48.5  MCV 89.8 90.2 88.2  PLT 314 353 358   Basic Metabolic Panel:  Recent Labs Lab 12/02/16 0250 12/02/16 1823 12/02/16 2255 12/03/16 0234 12/03/16 1058  NA 137 136  --  136  --   K 5.1 6.0* 6.1* 6.2* 6.0*  CL 103  102  --  102  --   CO2 19* 17*  --  17*  --   GLUCOSE 89 81  --  107*  --   BUN 67* 78*  --  88*  --   CREATININE 20.93* 21.46*  --  21.29*  --   CALCIUM 9.1 9.1  --  9.0  --    GFR: Estimated Creatinine Clearance: 5.9 mL/min (A) (by C-G formula based on SCr of 21.29 mg/dL (H)). Liver Function Tests:  Recent Labs Lab 12/02/16 0250  AST 15  ALT 14*  ALKPHOS 86  BILITOT 0.8  PROT 7.3  ALBUMIN 4.2    Recent Labs Lab 12/02/16 0250  LIPASE 207*   No results for input(s): AMMONIA in the last 168 hours. Coagulation Profile: No results for input(s): INR, PROTIME in the last 168 hours. Cardiac Enzymes: No results for input(s): CKTOTAL, CKMB, CKMBINDEX, TROPONINI in the last 168 hours. BNP (last 3 results) No results for input(s): PROBNP in the last 8760 hours. HbA1C: No results for input(s): HGBA1C in the last 72 hours. CBG: No results for input(s): GLUCAP in the last 168 hours. Lipid Profile: No results for input(s): CHOL, HDL, LDLCALC, TRIG, CHOLHDL, LDLDIRECT in the last 72 hours. Thyroid Function Tests: No results for input(s): TSH, T4TOTAL, FREET4, T3FREE, THYROIDAB in the last 72 hours. Anemia Panel: No results for input(s): VITAMINB12, FOLATE, FERRITIN, TIBC, IRON, RETICCTPCT in the last 72 hours. Urine analysis:    Component Value Date/Time   COLORURINE YELLOW 07/20/2014 0114   APPEARANCEUR CLOUDY (A) 07/20/2014 0114   LABSPEC 1.013 07/20/2014 0114   PHURINE 6.0 07/20/2014 0114   GLUCOSEU NEGATIVE 07/20/2014 0114   HGBUR SMALL (A) 07/20/2014 0114   BILIRUBINUR NEGATIVE 07/20/2014 0114   KETONESUR NEGATIVE 07/20/2014 0114   PROTEINUR >300 (A) 07/20/2014 0114   UROBILINOGEN 0.2 07/20/2014 0114   NITRITE NEGATIVE 07/20/2014 0114   LEUKOCYTESUR NEGATIVE 07/20/2014 0114   Sepsis Labs: Invalid input(s): PROCALCITONIN, LACTICIDVEN  Recent Results (from the past 240 hour(s))  MRSA PCR Screening     Status: None   Collection Time: 12/03/16  1:04 AM  Result  Value Ref Range Status   MRSA by PCR NEGATIVE NEGATIVE Final    Comment:        The GeneXpert MRSA Assay (FDA approved for NASAL specimens only), is one component of a comprehensive MRSA colonization surveillance program. It is not intended to diagnose MRSA infection nor to guide or monitor treatment for MRSA infections.       Radiology Studies: No results found.   Scheduled Meds: . amLODipine  10 mg Oral Daily  . [  START ON 12/04/2016] cinacalcet  60 mg Oral Q supper  . cloNIDine  0.1 mg Transdermal Weekly  . doxercalciferol  6 mcg Intravenous Q T,Th,Sa-HD  . heparin  5,000 Units Subcutaneous Q8H  . hydrALAZINE  100 mg Oral TID  . isosorbide mononitrate  60 mg Oral Daily  . labetalol  400 mg Oral TID  . multivitamin  1 tablet Oral QHS  . sevelamer carbonate  2,400 mg Oral TID WC  . sodium chloride flush  3 mL Intravenous Q12H  . sodium chloride flush  3 mL Intravenous Q12H   Continuous Infusions: . sodium chloride      Pamella Pert, MD, PhD [facility] Pager [phone]  If 7PM-7AM, please contact night-coverage [email or web address] Password [facility] 12/03/2016, 12:19 PM

## 2016-12-03 NOTE — Procedures (Signed)
Patient seen and examined on Hemodialysis. QB 400 AVF UF goal 4.5L --> 4.0 L; adjusted downward based on HR and BP.  Treatment adjusted as needed.  Bufford ButtnerElizabeth Bekki Tavenner MD [facility] pgr [phone] 3:06 PM

## 2016-12-03 NOTE — Progress Notes (Signed)
Pt. transported from ER via stretcher to 6E-07; alert and oriented x4- sleepy; c/o's nausea and too early for med, and explained to pt. Pt. oriented to room and call button.

## 2016-12-04 LAB — BASIC METABOLIC PANEL
ANION GAP: 15 (ref 5–15)
BUN: 62 mg/dL — AB (ref 6–20)
CHLORIDE: 98 mmol/L — AB (ref 101–111)
CO2: 21 mmol/L — AB (ref 22–32)
Calcium: 7.5 mg/dL — ABNORMAL LOW (ref 8.9–10.3)
Creatinine, Ser: 16.34 mg/dL — ABNORMAL HIGH (ref 0.61–1.24)
GFR calc Af Amer: 4 mL/min — ABNORMAL LOW (ref >60)
GFR calc non Af Amer: 4 mL/min — ABNORMAL LOW (ref >60)
GLUCOSE: 90 mg/dL (ref 65–99)
POTASSIUM: 4.2 mmol/L (ref 3.5–5.1)
Sodium: 134 mmol/L — ABNORMAL LOW (ref 135–145)

## 2016-12-04 LAB — CBC
HEMATOCRIT: 35.5 % — AB (ref 39.0–52.0)
HEMOGLOBIN: 11.5 g/dL — AB (ref 13.0–17.0)
MCH: 28.1 pg (ref 26.0–34.0)
MCHC: 32.4 g/dL (ref 30.0–36.0)
MCV: 86.8 fL (ref 78.0–100.0)
PLATELETS: 201 10*3/uL (ref 150–400)
RBC: 4.09 MIL/uL — AB (ref 4.22–5.81)
RDW: 15.4 % (ref 11.5–15.5)
WBC: 8.8 10*3/uL (ref 4.0–10.5)

## 2016-12-04 LAB — POTASSIUM
Potassium: 4.2 mmol/L (ref 3.5–5.1)
Potassium: 3.8 mmol/L (ref 3.5–5.1)

## 2016-12-04 MED ORDER — CINACALCET HCL 30 MG PO TABS: 60.0000 mg | ORAL_TABLET | Freq: Every day | ORAL | 0 refills | Status: DC

## 2016-12-04 NOTE — Progress Notes (Signed)
[facility] Progress Note  Dialysis Orders:  [facility] TTS 4h 180F BFR 400/800 2K/2.25 Ca EDW 88kg  L AVF -Heparin 4600 -Hectorol 6 mcg IV q HD -Mircera IV q 2wks (7/12) -Venofer 100mg  IV x10 ( has not started yet)   Assessment/Plan: 1. Abdominal pain N/V/D - improving with GI meds and HD. Leukocytosis improving  2. Hyperkalemia - Resolved with HD K 4.2  3. ESRD -  TTS - Missed HD Saturday. Next HD Thursday at outpatient center if d/c'd today. Discussed and encouraged not missing and completing full treatments.  4. Hypertension/volume  - BP improved today . Continue home meds( labetalol, clonidine hydralazine, amlodipine)/ Net UF 3L Post HD wt 88.4 kg  5. Anemia  - Hgb 11.5 - No ESA needs, follow trend  6. Metabolic bone disease -  Outpatient P 6.4 PTH 1306 - Continue Hectorol/ Renvela binder/Sensipar 7. Nutrition - Renal diet/vitamins   Subjective:  HD yesterday s/o early with cramping  Feeling much better today. Ate breakfast. Denies Abd pain/V/D   Objective Vitals:   12/03/16 1800 12/03/16 1833 12/03/16 2146 12/04/16 0503  BP:  (!) 172/117 (!) 139/59 (!) 146/74  Pulse:  99 87 80  Resp:  18 18 18   Temp: (!) 100.5 F (38.1 C) 98.8 F (37.1 C) 98.4 F (36.9 C) 97.8 F (36.6 C)  TempSrc:  Oral Oral Oral  SpO2:  100% 99% 99%  Weight:   88.4 kg (194 lb 14.2 oz)   Height:       Physical Exam General: WNWD  Heart: RRR  Lungs: CTAB  Abdomen: soft NT/ND  Extremities: no LE edema  Dialysis Access: LUE AVF +bruit    Tomasa Blase PA-C [facility] Pager [phone] 12/04/2016,9:39 AM  LOS: 1 day   Additional Objective Labs: Basic Metabolic Panel:  Recent Labs Lab 12/02/16 1823  12/03/16 0234  12/03/16 2317 12/04/16 0317 12/04/16 0720  NA 136  --  136  --   --  134*  --   K 6.0*  < > 6.2*  < > 4.2 4.2 4.2  CL 102  --  102  --   --  98*  --   CO2 17*  --  17*  --   --  21*  --   GLUCOSE 81  --  107*  --   --  90  --    BUN 78*  --  88*  --   --  62*  --   CREATININE 21.46*  --  21.29*  --   --  16.34*  --   CALCIUM 9.1  --  9.0  --   --  7.5*  --   < > = values in this interval not displayed. Liver Function Tests:  Recent Labs Lab 12/02/16 0250  AST 15  ALT 14*  ALKPHOS 86  BILITOT 0.8  PROT 7.3  ALBUMIN 4.2    Recent Labs Lab 12/02/16 0250  LIPASE 207*   CBC:  Recent Labs Lab 12/02/16 0250 12/02/16 1823 12/03/16 0234 12/04/16 0720  WBC 11.4* 12.6* 11.4* 8.8  NEUTROABS  --  10.5* 10.2*  --   HGB 11.2* 14.4 16.2 11.5*  HCT 35.1* 45.0 48.5 35.5*  MCV 89.8 90.2 88.2 86.8  PLT 314 353 358 201   Blood Culture    Component Value Date/Time   SDES BLOOD RIGHT ARM 11/19/2016 2025   SPECREQUEST IN PEDIATRIC BOTTLE Blood Culture adequate volume 11/19/2016 2025   CULT NO GROWTH  5 DAYS 11/19/2016 2025   REPTSTATUS 11/24/2016 FINAL 11/19/2016 2025    Cardiac Enzymes: No results for input(s): CKTOTAL, CKMB, CKMBINDEX, TROPONINI in the last 168 hours. CBG:  Recent Labs Lab 12/03/16 1754 12/03/16 2142  GLUCAP 90 112*   Iron Studies: No results for input(s): IRON, TIBC, TRANSFERRIN, FERRITIN in the last 72 hours. No results found for: INR, PROTIME Medications: . sodium chloride     . amLODipine  10 mg Oral Daily  . cinacalcet  60 mg Oral Q supper  . cloNIDine  0.1 mg Transdermal Weekly  . doxercalciferol  6 mcg Intravenous Q T,Th,Sa-HD  . heparin  5,000 Units Subcutaneous Q8H  . hydrALAZINE  100 mg Oral TID  . isosorbide mononitrate  60 mg Oral Daily  . labetalol  400 mg Oral TID  . multivitamin  1 tablet Oral QHS  . sevelamer carbonate  2,400 mg Oral TID WC  . sodium chloride flush  3 mL Intravenous Q12H  . sodium chloride flush  3 mL Intravenous Q12H

## 2016-12-05 NOTE — Progress Notes (Signed)
Late Entry Patient discharged 7/18, AVS was reviewed, IV removed, tele box returned. Patient left floor stable, with no complaints

## 2016-12-07 NOTE — Discharge Summary (Signed)
[facility] Discharge Summary   Patient: Jeremy Huerta WUJ:[id number]   PCP: [name], MD DOB: May 28, 1995   Date of admission: 12/02/2016   Date of discharge: 12/04/2016    Discharge Diagnoses:  Principal Problem:   Hyperkalemia Active Problems:   Hypertension   ESRD (end stage renal disease) (HCC)   Hypertensive urgency   Leukocytosis   Nausea & vomiting   Admitted From: home Disposition:  home  Recommendations for Outpatient Follow-up:  1. Please Follow up with her PCP as recommended   Follow-up Information    [name], MD. Schedule an appointment as soon as possible for a visit in 1 week(s).   Specialty:  Nephrology Contact information: [address] [place] [address] [phone]          Diet recommendation: Renal diet  Activity: The patient is advised to gradually reintroduce usual activities.  Discharge Condition: good  Code Status: Full code  History of present illness: As per the H and P dictated on admission, " Shawan Corella is a 21 y.o. male with medical history significant for hypertension and end-stage renal disease on hemodialysis, now presenting to the emergency department for evaluation of abdominal discomfort, nausea, and nonbloody nonbilious vomiting. Patient reports that he had been in his usual state of health until approximately 2 days ago when he noted the insidious development of a generalized abdominal discomfort with nausea. Symptoms progressed and nonbloody nonbilious vomiting ensued. Abdominal discomfort is described as generalized, worse across the upper quadrants, constant, severe, nonradiating, and without any alleviating or exacerbating factors identified. Denies any significant diarrhea. Denies melena or hematochezia. Reports experiencing similar symptoms previously. He missed his last dialysis session on 11/30/2016 due to missing his ride. He reports completing dialysis without incident on  11/28/2016. Denies any chest pain or palpitations, reports mild dyspnea, denies cough. "  Hospital Course:  Summary of his active problems in the hospital is as following. Abdominal pain, nausea and vomiting -Patient has been having similar symptoms for the past year.  - He has been evaluated extensively at [facility] with repeat imaging, upper endoscopy as well as colonoscopy.   - Biopsy showed a nonspecific enteritis without a clear cause for his symptoms.   - He missed his ride on Saturday and did not get to go to hemodialysis Improvement in symptoms and patient was able to tolerate diet prior to discharge  End-stage renal disease with hyperkalemia -Nephrology consulted,  received Kayexalate the emergency room Patient recommended to remain compliant with his hemodialysis as hyperkalemia and uremic symptoms can be life-threatening  Hypertensive urgency -Patient with chronically elevated blood pressure in the 160s 170s as an outpatient, now slightly higher than that due to missed dialysis. Improved after treatment. Continue home regimen.  All other chronic medical condition were stable during the hospitalization.  Patient was ambulatory without any assistance. 3On the day of the discharge the patient's vitals were stable, and no other acute medical condition were reported by patient. the patient was felt safe to be discharge at home with family.  Procedures and Results:  HD   Consultations:  Nephrology  DISCHARGE MEDICATION: Discharge Medication List as of 12/04/2016  1:46 PM    CONTINUE these medications which have CHANGED   Details  !! cinacalcet (SENSIPAR) 30 MG tablet Take 2 tablets (60 mg total) by mouth daily with supper., Starting Wed 12/04/2016, Normal     !! - Potential duplicate medications found. Please discuss with provider.    CONTINUE  these medications which have NOT CHANGED   Details  amLODipine (NORVASC) 10 MG tablet Take 10 mg by mouth daily.  , Starting Fri 07/15/2014, Historical Med    B Complex-C-Folic Acid (B COMPLEX-VITAMIN C-FOLIC ACID) 1 MG tablet Take 1 tablet by mouth daily., Starting Tue 04/30/2016, Historical Med    !! cinacalcet (SENSIPAR) 30 MG tablet Take 30 mg by mouth daily., Historical Med    cloNIDine (CATAPRES-TTS-1) 0.1 mg/24hr patch Place 0.1 mg onto the skin once a week., Historical Med    hydrALAZINE (APRESOLINE) 100 MG tablet Take 100 mg by mouth 3 (three) times daily. , Starting Sat 04/13/2016, Historical Med    isosorbide mononitrate (IMDUR) 60 MG 24 hr tablet Take 60 mg by mouth daily., Starting Sun 12/31/2015, Historical Med    labetalol (NORMODYNE) 200 MG tablet Take 400 mg by mouth 3 (three) times daily., Starting Sat 03/16/2016, Historical Med    loperamide (IMODIUM A-D) 2 MG tablet Take 1 tablet (2 mg total) by mouth 4 (four) times daily as needed for diarrhea or loose stools., Starting Tue 02/13/2016, Print    metoCLOPramide (REGLAN) 10 MG tablet Take 1 tablet (10 mg total) by mouth 3 (three) times daily as needed for nausea (headache / nausea)., Starting Sun 10/27/2016, Print    ondansetron (ZOFRAN ODT) 4 MG disintegrating tablet Take 1 tablet (4 mg total) by mouth every 8 (eight) hours as needed for nausea or vomiting., Starting Mon 12/02/2016, Print    sevelamer (RENAGEL) 800 MG tablet Take 2,400 mg by mouth 3 (three) times daily with meals. , Historical Med     !! - Potential duplicate medications found. Please discuss with provider.     No Known Allergies Discharge Instructions    Diet renal [id number]    Complete by:  As directed    Discharge instructions    Complete by:  As directed    It is important that you read following instructions as well as go over your medication list with RN to help you understand your care after this hospitalization.  Discharge Instructions: Please follow-up with PCP in one week  Please request your primary care physician to go over all Hospital Tests  and Procedure/Radiological results at the follow up,  Please get all Hospital records sent to your PCP by signing hospital release before you go home.   Do not take more than prescribed Pain, Sleep and Anxiety Medications. You were cared for by a hospitalist during your hospital stay. If you have any questions about your discharge medications or the care you received while you were in the hospital after you are discharged, you can call the unit and ask to speak with the hospitalist on call if the hospitalist that took care of you is not available.  Once you are discharged, your primary care physician will handle any further medical issues. Please note that NO REFILLS for any discharge medications will be authorized once you are discharged, as it is imperative that you return to your primary care physician (or establish a relationship with a primary care physician if you do not have one) for your aftercare needs so that they can reassess your need for medications and monitor your lab values. You Must read complete instructions/literature along with all the possible adverse reactions/side effects for all the Medicines you take and that have been prescribed to you. Take any new Medicines after you have completely understood and accept all the possible adverse reactions/side effects. Wear Seat belts while driving. If you  have smoked or chewed Tobacco in the last 2 yrs please stop smoking and/or stop any Recreational drug use.   Increase activity slowly    Complete by:  As directed      Discharge Exam: Filed Weights   12/03/16 1342 12/03/16 1700 12/03/16 2146  Weight: 91.4 kg (201 lb 8 oz) 88.4 kg (194 lb 14.2 oz) 88.4 kg (194 lb 14.2 oz)   Vitals:   12/04/16 0815 12/04/16 1030  BP: (!) 144/73 (!) 152/77  Pulse: 82   Resp: 18   Temp: 98.4 F (36.9 C)    General: Appear in no distress, no Rash; Oral Mucosa moist. Cardiovascular: S1 and S2 Present, no Murmur, no JVD Respiratory: Bilateral Air  entry present and Clear to Auscultation, no Crackles, no wheezes Abdomen: Bowel Sound present, Soft and no tenderness Extremities: no Pedal edema, no calf tenderness Neurology: Grossly no focal neuro deficit.  The results of significant diagnostics from this hospitalization (including imaging, microbiology, ancillary and laboratory) are listed below for reference.    Significant Diagnostic Studies: Dg Chest 2 View  Result Date: 11/19/2016 CLINICAL DATA:  Cough, fever, and generalized weakness. Referred from dialysis. EXAM: CHEST  2 VIEW COMPARISON:  Radiograph 10/26/2016 FINDINGS: Stable cardiomegaly allowing for differences in technique. Unchanged mediastinal contours. No pulmonary edema. No pleural effusion, focal airspace disease or pneumothorax. Trace fluid in the fissures is unchanged from prior exam. No acute osseous abnormalities. IMPRESSION: Stable cardiomegaly.  No congestive failure or acute abnormality. Electronically Signed   By: Rubye Oaks M.D.   On: 11/19/2016 18:24    Microbiology: Recent Results (from the past 240 hour(s))  MRSA PCR Screening     Status: None   Collection Time: 12/03/16  1:04 AM  Result Value Ref Range Status   MRSA by PCR NEGATIVE NEGATIVE Final    Comment:        The GeneXpert MRSA Assay (FDA approved for NASAL specimens only), is one component of a comprehensive MRSA colonization surveillance program. It is not intended to diagnose MRSA infection nor to guide or monitor treatment for MRSA infections.      Labs: CBC:  Recent Labs Lab 12/02/16 0250 12/02/16 1823 12/03/16 0234 12/04/16 0720  WBC 11.4* 12.6* 11.4* 8.8  NEUTROABS  --  10.5* 10.2*  --   HGB 11.2* 14.4 16.2 11.5*  HCT 35.1* 45.0 48.5 35.5*  MCV 89.8 90.2 88.2 86.8  PLT 314 353 358 201   Basic Metabolic Panel:  Recent Labs Lab 12/02/16 0250 12/02/16 1823  12/03/16 0234  12/03/16 1838 12/03/16 2317 12/04/16 0317 12/04/16 0720 12/04/16 1007  NA 137 136  --   136  --   --   --  134*  --   --   K 5.1 6.0*  < > 6.2*  < > 3.8 4.2 4.2 4.2 3.8  CL 103 102  --  102  --   --   --  98*  --   --   CO2 19* 17*  --  17*  --   --   --  21*  --   --   GLUCOSE 89 81  --  107*  --   --   --  90  --   --   BUN 67* 78*  --  88*  --   --   --  62*  --   --   CREATININE 20.93* 21.46*  --  21.29*  --   --   --  16.34*  --   --   CALCIUM 9.1 9.1  --  9.0  --   --   --  7.5*  --   --   < > = values in this interval not displayed. Liver Function Tests:  Recent Labs Lab 12/02/16 0250  AST 15  ALT 14*  ALKPHOS 86  BILITOT 0.8  PROT 7.3  ALBUMIN 4.2    Recent Labs Lab 12/02/16 0250  LIPASE 207*   No results for input(s): AMMONIA in the last 168 hours. Cardiac Enzymes: No results for input(s): CKTOTAL, CKMB, CKMBINDEX, TROPONINI in the last 168 hours. BNP (last 3 results) No results for input(s): BNP in the last 8760 hours. CBG:  Recent Labs Lab 12/03/16 1754 12/03/16 2142  GLUCAP 90 112*   Time spent: 35 minutes  Signed:  Tiago Humphrey  [facility] 12/04/2016 , 4:38 PM

## 2016-12-24 ENCOUNTER — Inpatient Hospital Stay (HOSPITAL_COMMUNITY)
Admission: EM | Admit: 2016-12-24 | Discharge: 2016-12-27 | DRG: 640 | Disposition: A | Discharge status: Home / Self Care | Payer: Medicaid Other | Attending: Internal Medicine | Admitting: Internal Medicine

## 2016-12-24 ENCOUNTER — Encounter (HOSPITAL_COMMUNITY): Payer: Self-pay | Admitting: Emergency Medicine

## 2016-12-24 ENCOUNTER — Emergency Department (HOSPITAL_COMMUNITY): Payer: Medicaid Other

## 2016-12-24 DIAGNOSIS — E875 Hyperkalemia: Secondary | ICD-10-CM | POA: Diagnosis present

## 2016-12-24 DIAGNOSIS — Z833 Family history of diabetes mellitus: Secondary | ICD-10-CM

## 2016-12-24 DIAGNOSIS — E8770 Fluid overload, unspecified: Secondary | ICD-10-CM | POA: Diagnosis present

## 2016-12-24 DIAGNOSIS — Z79899 Other long term (current) drug therapy: Secondary | ICD-10-CM | POA: Diagnosis not present

## 2016-12-24 DIAGNOSIS — N2581 Secondary hyperparathyroidism of renal origin: Secondary | ICD-10-CM | POA: Diagnosis present

## 2016-12-24 DIAGNOSIS — N269 Renal sclerosis, unspecified: Secondary | ICD-10-CM | POA: Diagnosis present

## 2016-12-24 DIAGNOSIS — Z841 Family history of disorders of kidney and ureter: Secondary | ICD-10-CM | POA: Diagnosis not present

## 2016-12-24 DIAGNOSIS — Z9119 Patient's noncompliance with other medical treatment and regimen: Secondary | ICD-10-CM | POA: Diagnosis not present

## 2016-12-24 DIAGNOSIS — Z8249 Family history of ischemic heart disease and other diseases of the circulatory system: Secondary | ICD-10-CM | POA: Diagnosis not present

## 2016-12-24 DIAGNOSIS — I120 Hypertensive chronic kidney disease with stage 5 chronic kidney disease or end stage renal disease: Secondary | ICD-10-CM | POA: Diagnosis present

## 2016-12-24 DIAGNOSIS — Z9115 Patient's noncompliance with renal dialysis: Secondary | ICD-10-CM | POA: Diagnosis not present

## 2016-12-24 DIAGNOSIS — J9600 Acute respiratory failure, unspecified whether with hypoxia or hypercapnia: Secondary | ICD-10-CM | POA: Diagnosis present

## 2016-12-24 DIAGNOSIS — N186 End stage renal disease: Secondary | ICD-10-CM | POA: Diagnosis present

## 2016-12-24 DIAGNOSIS — I160 Hypertensive urgency: Secondary | ICD-10-CM | POA: Diagnosis not present

## 2016-12-24 DIAGNOSIS — Z9889 Other specified postprocedural states: Secondary | ICD-10-CM

## 2016-12-24 DIAGNOSIS — Y95 Nosocomial condition: Secondary | ICD-10-CM | POA: Diagnosis present

## 2016-12-24 DIAGNOSIS — J9690 Respiratory failure, unspecified, unspecified whether with hypoxia or hypercapnia: Secondary | ICD-10-CM | POA: Insufficient documentation

## 2016-12-24 DIAGNOSIS — J9601 Acute respiratory failure with hypoxia: Secondary | ICD-10-CM | POA: Diagnosis present

## 2016-12-24 DIAGNOSIS — I10 Essential (primary) hypertension: Secondary | ICD-10-CM | POA: Diagnosis not present

## 2016-12-24 DIAGNOSIS — J810 Acute pulmonary edema: Secondary | ICD-10-CM | POA: Diagnosis present

## 2016-12-24 DIAGNOSIS — D649 Anemia, unspecified: Secondary | ICD-10-CM | POA: Diagnosis present

## 2016-12-24 DIAGNOSIS — Z992 Dependence on renal dialysis: Secondary | ICD-10-CM

## 2016-12-24 DIAGNOSIS — E8889 Other specified metabolic disorders: Secondary | ICD-10-CM | POA: Diagnosis present

## 2016-12-24 DIAGNOSIS — J189 Pneumonia, unspecified organism: Secondary | ICD-10-CM

## 2016-12-24 DIAGNOSIS — Z09 Encounter for follow-up examination after completed treatment for conditions other than malignant neoplasm: Secondary | ICD-10-CM

## 2016-12-24 LAB — BASIC METABOLIC PANEL
Anion gap: 16 — ABNORMAL HIGH (ref 5–15)
BUN: 83 mg/dL — AB (ref 6–20)
CALCIUM: 8.7 mg/dL — AB (ref 8.9–10.3)
CO2: 20 mmol/L — ABNORMAL LOW (ref 22–32)
Chloride: 104 mmol/L (ref 101–111)
Creatinine, Ser: 21.8 mg/dL — ABNORMAL HIGH (ref 0.61–1.24)
GFR calc non Af Amer: 3 mL/min — ABNORMAL LOW (ref >60)
GFR, EST AFRICAN AMERICAN: 3 mL/min — AB (ref >60)
Glucose, Bld: 89 mg/dL (ref 65–99)
Potassium: 6.1 mmol/L — ABNORMAL HIGH (ref 3.5–5.1)
SODIUM: 140 mmol/L (ref 135–145)

## 2016-12-24 LAB — CBC WITH DIFFERENTIAL/PLATELET
BASOS ABS: 0.2 10*3/uL — AB (ref 0.0–0.1)
BASOS PCT: 2 %
EOS ABS: 0.3 10*3/uL (ref 0.0–0.7)
Eosinophils Relative: 3 %
HEMATOCRIT: 30.2 % — AB (ref 39.0–52.0)
Hemoglobin: 9.6 g/dL — ABNORMAL LOW (ref 13.0–17.0)
Lymphocytes Relative: 14 %
Lymphs Abs: 1.3 10*3/uL (ref 0.7–4.0)
MCH: 27.6 pg (ref 26.0–34.0)
MCHC: 31.8 g/dL (ref 30.0–36.0)
MCV: 86.8 fL (ref 78.0–100.0)
MONO ABS: 0.7 10*3/uL (ref 0.1–1.0)
MONOS PCT: 7 %
NEUTROS ABS: 7.2 10*3/uL (ref 1.7–7.7)
NEUTROS PCT: 74 %
Platelets: 364 10*3/uL (ref 150–400)
RBC: 3.48 MIL/uL — ABNORMAL LOW (ref 4.22–5.81)
RDW: 15.1 % (ref 11.5–15.5)
WBC: 9.7 10*3/uL (ref 4.0–10.5)

## 2016-12-24 LAB — I-STAT CHEM 8, ED
BUN: 77 mg/dL — ABNORMAL HIGH (ref 6–20)
CALCIUM ION: 1.07 mmol/L — AB (ref 1.15–1.40)
Chloride: 107 mmol/L (ref 101–111)
Glucose, Bld: 88 mg/dL (ref 65–99)
HEMATOCRIT: 31 % — AB (ref 39.0–52.0)
HEMOGLOBIN: 10.5 g/dL — AB (ref 13.0–17.0)
Potassium: 6 mmol/L — ABNORMAL HIGH (ref 3.5–5.1)
SODIUM: 139 mmol/L (ref 135–145)
TCO2: 23 mmol/L (ref 0–100)

## 2016-12-24 LAB — HIV ANTIBODY (ROUTINE TESTING W REFLEX): HIV Screen 4th Generation wRfx: NONREACTIVE

## 2016-12-24 LAB — HEPATIC FUNCTION PANEL
ALBUMIN: 3.3 g/dL — AB (ref 3.5–5.0)
ALK PHOS: 69 U/L (ref 38–126)
ALT: 22 U/L (ref 17–63)
AST: 21 U/L (ref 15–41)
BILIRUBIN INDIRECT: 0.5 mg/dL (ref 0.3–0.9)
Bilirubin, Direct: 0.1 mg/dL (ref 0.1–0.5)
TOTAL PROTEIN: 6.4 g/dL — AB (ref 6.5–8.1)
Total Bilirubin: 0.6 mg/dL (ref 0.3–1.2)

## 2016-12-24 LAB — PHOSPHORUS: PHOSPHORUS: 5.1 mg/dL — AB (ref 2.5–4.6)

## 2016-12-24 LAB — LIPASE, BLOOD: LIPASE: 40 U/L (ref 11–51)

## 2016-12-24 LAB — PROCALCITONIN: Procalcitonin: 1.33 ng/mL

## 2016-12-24 LAB — MAGNESIUM: MAGNESIUM: 2.6 mg/dL — AB (ref 1.7–2.4)

## 2016-12-24 MED ORDER — LIDOCAINE-PRILOCAINE 2.5-2.5 % EX CREA: 1.0000 "application " | TOPICAL_CREAM | CUTANEOUS | Status: DC | PRN

## 2016-12-24 MED ORDER — HEPARIN SODIUM (PORCINE) 1000 UNIT/ML DIALYSIS: 1000.0000 [IU] | INTRAMUSCULAR | Status: DC | PRN

## 2016-12-24 MED ORDER — SEVELAMER CARBONATE 800 MG PO TABS: 800.0000 mg | ORAL_TABLET | Freq: Three times a day (TID) | ORAL | Status: DC

## 2016-12-24 MED ORDER — HEPARIN SODIUM (PORCINE) 5000 UNIT/ML IJ SOLN
5000.0000 [IU] | Freq: Three times a day (TID) | INTRAMUSCULAR | Status: DC
  Administered 2016-12-25: 5000 [IU] via SUBCUTANEOUS
  Filled 2016-12-24: qty 1

## 2016-12-24 MED ORDER — SODIUM CHLORIDE 0.9 % IV SOLN: 100.0000 mL | INTRAVENOUS | Status: DC | PRN

## 2016-12-24 MED ORDER — LOPERAMIDE HCL 2 MG PO TABS: 2.0000 mg | ORAL_TABLET | Freq: Four times a day (QID) | ORAL | Status: DC | PRN

## 2016-12-24 MED ORDER — HEPARIN SODIUM (PORCINE) 1000 UNIT/ML DIALYSIS
20.0000 [IU]/kg | INTRAMUSCULAR | Status: DC | PRN
  Administered 2016-12-24: 1800 [IU] via INTRAVENOUS_CENTRAL

## 2016-12-24 MED ORDER — ACETAMINOPHEN 325 MG PO TABS
650.0000 mg | ORAL_TABLET | Freq: Four times a day (QID) | ORAL | Status: DC | PRN
  Administered 2016-12-24 – 2016-12-25 (×3): 650 mg via ORAL
  Filled 2016-12-24 (×3): qty 2

## 2016-12-24 MED ORDER — CINACALCET HCL 30 MG PO TABS
60.0000 mg | ORAL_TABLET | Freq: Every day | ORAL | Status: DC
  Administered 2016-12-24 – 2016-12-26 (×3): 60 mg via ORAL
  Filled 2016-12-24 (×4): qty 2

## 2016-12-24 MED ORDER — HYDRALAZINE HCL 20 MG/ML IJ SOLN
INTRAMUSCULAR | Status: AC
  Administered 2016-12-24: 5 mg via INTRAVENOUS
  Filled 2016-12-24: qty 1

## 2016-12-24 MED ORDER — VANCOMYCIN HCL 10 G IV SOLR
1750.0000 mg | Freq: Once | INTRAVENOUS | Status: AC
  Administered 2016-12-24: 1750 mg via INTRAVENOUS
  Filled 2016-12-24: qty 1750

## 2016-12-24 MED ORDER — AMLODIPINE BESYLATE 10 MG PO TABS
10.0000 mg | ORAL_TABLET | Freq: Every day | ORAL | Status: DC
  Filled 2016-12-24: qty 1

## 2016-12-24 MED ORDER — METOCLOPRAMIDE HCL 10 MG PO TABS: 10.0000 mg | ORAL_TABLET | Freq: Three times a day (TID) | ORAL | Status: DC | PRN

## 2016-12-24 MED ORDER — LABETALOL HCL 200 MG PO TABS
400.0000 mg | ORAL_TABLET | Freq: Three times a day (TID) | ORAL | Status: DC
  Administered 2016-12-24 – 2016-12-26 (×6): 400 mg via ORAL
  Filled 2016-12-24 (×7): qty 2

## 2016-12-24 MED ORDER — DEXTROSE 5 % IV SOLN
1.0000 g | INTRAVENOUS | Status: DC
  Administered 2016-12-25: 1 g via INTRAVENOUS
  Filled 2016-12-24 (×2): qty 1

## 2016-12-24 MED ORDER — PENTAFLUOROPROP-TETRAFLUOROETH EX AERO: 1.0000 "application " | INHALATION_SPRAY | CUTANEOUS | Status: DC | PRN

## 2016-12-24 MED ORDER — DOXERCALCIFEROL 4 MCG/2ML IV SOLN
7.0000 ug | INTRAVENOUS | Status: DC
  Administered 2016-12-24 – 2016-12-26 (×2): 7 ug via INTRAVENOUS
  Filled 2016-12-24: qty 4

## 2016-12-24 MED ORDER — METOPROLOL TARTRATE 5 MG/5ML IV SOLN
2.5000 mg | Freq: Once | INTRAVENOUS | Status: AC
  Administered 2016-12-24: 2.5 mg via INTRAVENOUS
  Filled 2016-12-24: qty 5

## 2016-12-24 MED ORDER — VANCOMYCIN HCL IN DEXTROSE 1-5 GM/200ML-% IV SOLN
INTRAVENOUS | Status: AC
  Administered 2016-12-24: 1000 mg via INTRAVENOUS
  Filled 2016-12-24: qty 200

## 2016-12-24 MED ORDER — HYDRALAZINE HCL 50 MG PO TABS
100.0000 mg | ORAL_TABLET | Freq: Three times a day (TID) | ORAL | Status: DC
  Administered 2016-12-24 – 2016-12-26 (×6): 100 mg via ORAL
  Filled 2016-12-24 (×7): qty 2

## 2016-12-24 MED ORDER — INSULIN ASPART 100 UNIT/ML IV SOLN
10.0000 [IU] | Freq: Once | INTRAVENOUS | Status: AC
Start: 2016-12-24 — End: 2016-12-24
  Administered 2016-12-24: 10 [IU] via INTRAVENOUS

## 2016-12-24 MED ORDER — DEXTROSE 50 % IV SOLN
50.0000 mL | Freq: Once | INTRAVENOUS | Status: AC
  Administered 2016-12-24: 50 mL via INTRAVENOUS
  Filled 2016-12-24: qty 50

## 2016-12-24 MED ORDER — CALCIUM GLUCONATE 10 % IV SOLN
1.0000 g | Freq: Once | INTRAVENOUS | Status: AC
  Administered 2016-12-24: 1 g via INTRAVENOUS
  Filled 2016-12-24: qty 10

## 2016-12-24 MED ORDER — DOXERCALCIFEROL 4 MCG/2ML IV SOLN
INTRAVENOUS | Status: AC
  Administered 2016-12-24: 7 ug via INTRAVENOUS
  Filled 2016-12-24: qty 4

## 2016-12-24 MED ORDER — NEPRO/CARBSTEADY PO LIQD
237.0000 mL | Freq: Two times a day (BID) | ORAL | Status: DC
  Filled 2016-12-24 (×10): qty 237

## 2016-12-24 MED ORDER — CLONIDINE HCL 0.1 MG/24HR TD PTWK
0.1000 mg | MEDICATED_PATCH | TRANSDERMAL | Status: DC
  Administered 2016-12-24: 0.1 mg via TRANSDERMAL
  Filled 2016-12-24: qty 1

## 2016-12-24 MED ORDER — VANCOMYCIN HCL IN DEXTROSE 1-5 GM/200ML-% IV SOLN
1000.0000 mg | INTRAVENOUS | Status: DC
  Administered 2016-12-24: 1000 mg via INTRAVENOUS

## 2016-12-24 MED ORDER — HYDRALAZINE HCL 20 MG/ML IJ SOLN
5.0000 mg | INTRAMUSCULAR | Status: DC | PRN
  Administered 2016-12-24: 10 mg via INTRAVENOUS
  Administered 2016-12-24: 5 mg via INTRAVENOUS
  Administered 2016-12-24: 10 mg via INTRAVENOUS
  Administered 2016-12-24: 5 mg via INTRAVENOUS
  Administered 2016-12-25 – 2016-12-26 (×3): 10 mg via INTRAVENOUS
  Administered 2016-12-26: 5 mg via INTRAVENOUS
  Filled 2016-12-24 (×6): qty 1

## 2016-12-24 MED ORDER — SEVELAMER CARBONATE 800 MG PO TABS
2400.0000 mg | ORAL_TABLET | Freq: Three times a day (TID) | ORAL | Status: DC
  Administered 2016-12-25 – 2016-12-27 (×6): 2400 mg via ORAL
  Filled 2016-12-24 (×6): qty 3

## 2016-12-24 MED ORDER — ACETAMINOPHEN 650 MG RE SUPP: 650.0000 mg | Freq: Four times a day (QID) | RECTAL | Status: DC | PRN

## 2016-12-24 MED ORDER — ISOSORBIDE MONONITRATE ER 60 MG PO TB24
60.0000 mg | ORAL_TABLET | Freq: Every day | ORAL | Status: DC
  Administered 2016-12-24 – 2016-12-25 (×2): 60 mg via ORAL
  Filled 2016-12-24: qty 2
  Filled 2016-12-24: qty 1

## 2016-12-24 MED ORDER — ONDANSETRON HCL 4 MG/2ML IJ SOLN
4.0000 mg | Freq: Once | INTRAMUSCULAR | Status: AC
  Administered 2016-12-24: 4 mg via INTRAVENOUS
  Filled 2016-12-24: qty 2

## 2016-12-24 MED ORDER — PROMETHAZINE HCL 25 MG PO TABS: 12.5000 mg | ORAL_TABLET | Freq: Four times a day (QID) | ORAL | Status: DC | PRN

## 2016-12-24 MED ORDER — LIDOCAINE HCL (PF) 1 % IJ SOLN: 5.0000 mL | INTRAMUSCULAR | Status: DC | PRN

## 2016-12-24 NOTE — ED Provider Notes (Signed)
Blood pressure (!) 169/116, pulse 95, temperature 99.1 F (37.3 C), temperature source Oral, resp. rate (!) 29, weight 89.4 kg (197 lb 1.5 oz), SpO2 99 %.  Assuming care from Dr. Judd Lienelo.  In short, Jeremy Huerta is a 21 y.o. male with a chief complaint of Emesis; Abdominal Pain; and Chest Pain .  Refer to the original H&P for additional details.  The current plan of care is to admit the patient for HD.  07:28 AM Patient remains on BiPAP.   Discussed patient's case with Hospitalist, Dr. Konrad DoloresMerrell. Plans to admit to stepdown. Patient and family (if present) updated with plan. Care transferred to Hospitalist service.  I reviewed all nursing notes, vitals, pertinent old records, EKGs, labs, imaging (as available).  Alona BeneJoshua Long, MD   Maia PlanLong, Jeremy G, MD 12/24/16 [phone]

## 2016-12-24 NOTE — H&P (Addendum)
History and Physical    Jeremy Huerta ZOX:[id number] DOB: [birth year] DOA: 12/24/2016  PCP: [name], MD Patient coming from: home  Chief Complaint: emesis, Abd pain, CP  HPI: Jeremy Huerta is a 21 y.o. male with medical history significant of ESRD, FSGS, HTN.   Level 5 caveat applies. Patient presenting in acute respiratory failure and somnolence and unable to provide detailed history at this time. History obtained by consultation with you and from the patient.  Patient presenting with one day of acute worsening shortness of breath. Reports several day history of cough and URI type symptoms but denies actual fevers. Patient states he's had some chest discomfort with coughing but no true chest pain. Patient states he typically gets dialysis on Tuesday Thursday and Saturday and missed his dialysis session on Saturday. At time of my examination patient also denies rash, abdominal pain, nausea, vomiting, flank pain, neck stiffness, headache, focal neurological deficit.  No further history is able to be obtained.  ED Course: placed on bipap due to respiratory effort and hypoxemia.   Review of Systems: As per HPI otherwise all other systems reviewed and are negative  Ambulatory Status:no restrictions  Past Medical History:  Diagnosis Date  . Acute respiratory failure (HCC)   . ESRD (end stage renal disease) (HCC)   . FSGS (focal segmental glomerulosclerosis)   . Hypertension   . Nausea & vomiting 11/2016  . Renal disorder    on dialysis    Past Surgical History:  Procedure Laterality Date  . AV FISTULA PLACEMENT  12/2015  . RENAL BIOPSY  2013    Social History   Social History  . Marital status: Single    Spouse name: N/A  . Number of children: N/A  . Years of education: N/A   Occupational History  . Not on file.   Social History Main Topics  . Smoking status: Never Smoker  . Smokeless tobacco: Never Used  . Alcohol use No  . Drug use: No  . Sexual  activity: Not on file   Other Topics Concern  . Not on file   Social History Narrative  . No narrative on file    No Known Allergies  Family History  Problem Relation Age of Onset  . Hypertension Mother   . Diabetes Father   . Hypertension Father   . Kidney disease Father   . Hypertension Maternal Grandmother   . Diabetes Paternal Grandmother   . Hypertension Paternal Grandmother       Prior to Admission medications   Medication Sig Start Date End Date Taking? Authorizing Provider  amLODipine (NORVASC) 10 MG tablet Take 10 mg by mouth daily.  07/15/14   [provider]  B Complex-C-Folic Acid (B COMPLEX-VITAMIN C-FOLIC ACID) 1 MG tablet Take 1 tablet by mouth daily. 04/30/16   [provider]  cinacalcet (SENSIPAR) 30 MG tablet Take 30 mg by mouth daily.    [provider]  cinacalcet (SENSIPAR) 30 MG tablet Take 2 tablets (60 mg total) by mouth daily with supper. 12/04/16   Rolly Salter, MD  cloNIDine (CATAPRES-TTS-1) 0.1 mg/24hr patch Place 0.1 mg onto the skin once a week.    [provider]  hydrALAZINE (APRESOLINE) 100 MG tablet Take 100 mg by mouth 3 (three) times daily.  04/13/16   [provider]  isosorbide mononitrate (IMDUR) 60 MG 24 hr tablet Take 60 mg by mouth daily. 12/31/15   [provider]  labetalol (NORMODYNE) 200 MG tablet Take 400  mg by mouth 3 (three) times daily. 03/16/16   [provider]  loperamide (IMODIUM A-D) 2 MG tablet Take 1 tablet (2 mg total) by mouth 4 (four) times daily as needed for diarrhea or loose stools. 02/13/16   Vanetta Mulders, MD  metoCLOPramide (REGLAN) 10 MG tablet Take 1 tablet (10 mg total) by mouth 3 (three) times daily as needed for nausea (headache / nausea). 10/27/16   Everlene Farrier, PA-C  ondansetron (ZOFRAN ODT) 4 MG disintegrating tablet Take 1 tablet (4 mg total) by mouth every 8 (eight) hours as needed for nausea or vomiting. 12/02/16   Ward, Chase Picket, PA-C   sevelamer (RENAGEL) 800 MG tablet Take 2,400 mg by mouth 3 (three) times daily with meals.     [provider]    Physical Exam: Vitals:   12/24/16 0630 12/24/16 0645 12/24/16 0715 12/24/16 0730  BP: (!) 188/117 (!) 181/118 (!) 169/116 (!) 169/120  Pulse: (!) 101 98 95 97  Resp: (!) 34 (!) 31 (!) 29 (!) 28  Temp:      TempSrc:      SpO2: 100% 98% 99% 99%  Weight:         General: Sleeping in bed. When awake appears distressed. Eyes:  PERRL, EOMI, normal lids, iris ENT:  grossly normal hearing, lips & tongue, mmm Neck:  no LAD, masses or thyromegaly Cardiovascular: 4-6 systolic murmur, trace bilateral lower extremity edema, RRR, AVG thrill present in LE Respiratory: March crackles, rhonchi wheezing throughout. On BiPAP. Increased effort. Abdomen:  soft, ntnd, NABS Skin:  no rash or induration seen on limited exam Musculoskeletal:  grossly normal tone BUE/BLE, good ROM, no bony abnormality Psychiatric:  grossly normal mood and affect, speech fluent and appropriate, AOx3 Neurologic:  CN 2-12 grossly intact, moves all extremities in coordinated fashion, sensation intact  Labs on Admission: I have personally reviewed following labs and imaging studies  CBC:  Recent Labs Lab 12/24/16 0448 12/24/16 0523  WBC 9.7  --   NEUTROABS 7.2  --   HGB 9.6* 10.5*  HCT 30.2* 31.0*  MCV 86.8  --   PLT 364  --    Basic Metabolic Panel:  Recent Labs Lab 12/24/16 0448 12/24/16 0523  NA 140 139  K 6.1* 6.0*  CL 104 107  CO2 20*  --   GLUCOSE 89 88  BUN 83* 77*  CREATININE 21.80* >18.00*  CALCIUM 8.7*  --    GFR: CrCl cannot be calculated (This lab value cannot be used to calculate CrCl because it is not a number: >18.00). Liver Function Tests:  Recent Labs Lab 12/24/16 0448  AST 21  ALT 22  ALKPHOS 69  BILITOT 0.6  PROT 6.4*  ALBUMIN 3.3*    Recent Labs Lab 12/24/16 0448  LIPASE 40   No results for input(s): AMMONIA in the last 168 hours. Coagulation  Profile: No results for input(s): INR, PROTIME in the last 168 hours. Cardiac Enzymes: No results for input(s): CKTOTAL, CKMB, CKMBINDEX, TROPONINI in the last 168 hours. BNP (last 3 results) No results for input(s): PROBNP in the last 8760 hours. HbA1C: No results for input(s): HGBA1C in the last 72 hours. CBG: No results for input(s): GLUCAP in the last 168 hours. Lipid Profile: No results for input(s): CHOL, HDL, LDLCALC, TRIG, CHOLHDL, LDLDIRECT in the last 72 hours. Thyroid Function Tests: No results for input(s): TSH, T4TOTAL, FREET4, T3FREE, THYROIDAB in the last 72 hours. Anemia Panel: No results for input(s): VITAMINB12, FOLATE, FERRITIN, TIBC, IRON,  RETICCTPCT in the last 72 hours. Urine analysis:    Component Value Date/Time   COLORURINE YELLOW 07/20/2014 0114   APPEARANCEUR CLOUDY (A) 07/20/2014 0114   LABSPEC 1.013 07/20/2014 0114   PHURINE 6.0 07/20/2014 0114   GLUCOSEU NEGATIVE 07/20/2014 0114   HGBUR SMALL (A) 07/20/2014 0114   BILIRUBINUR NEGATIVE 07/20/2014 0114   KETONESUR NEGATIVE 07/20/2014 0114   PROTEINUR >300 (A) 07/20/2014 0114   UROBILINOGEN 0.2 07/20/2014 0114   NITRITE NEGATIVE 07/20/2014 0114   LEUKOCYTESUR NEGATIVE 07/20/2014 0114    Creatinine Clearance: CrCl cannot be calculated (This lab value cannot be used to calculate CrCl because it is not a number: >18.00).  Sepsis Labs: @LABRCNTIP (procalcitonin:4,lacticidven:4) )No results found for this or any previous visit (from the past 240 hour(s)).   Radiological Exams on Admission: Dg Chest Port 1 View  Result Date: 12/24/2016 CLINICAL DATA:  Acute onset of shortness of breath. Initial encounter. EXAM: PORTABLE CHEST 1 VIEW COMPARISON:  Chest radiograph performed 11/19/2016 FINDINGS: Bibasilar airspace opacities, right greater than left, raise concern for multifocal pneumonia. Pulmonary edema could have a similar appearance. No pleural effusion or pneumothorax is seen. The cardiomediastinal  silhouette is borderline enlarged. No acute osseous abnormalities are seen. IMPRESSION: 1. Bibasilar airspace opacities, right greater than left, raise concern for multifocal pneumonia. Pulmonary edema could have a similar appearance. 2. Borderline cardiomegaly. Electronically Signed   By: Roanna RaiderJeffery  Chang M.D.   On: 12/24/2016 05:13    EKG: Independently reviewed. Sinus. No ACS  Assessment/Plan Active Problems:   Acute respiratory failure (HCC)   HCAP (healthcare-associated pneumonia)   ESRD (end stage renal disease) (HCC)   Hypertensive urgency   Hyperkalemia   Acute history failure: Likely secondary to pulmonary edema secondary to missing dialysis with possible underlying respiratory infection (viral/bacterial). On BiPAP, marked increased effort, with O2 saturation is documented in the mid 80s when patient arrived on room air. CXR as above. Patient endorsing recent URI type symptoms prior to admission with chest discomfort with deep inspiration or coughing. - Emergent dialysis. (Nephrology made aware by EDP) - Pro calcitonin, respiratory viral panel - We will initiate pneumonia order set for presumed pneumonia though this therapy can be deescalated  if patient appears to improve rapidly after dialysis and other studies return negative.  ESRD: Dialysis on T,TH,Sat. Last dialysis 5 days prior to admission.  - Dialysis per nephrology.  - Will add Mag and phos to labs.  - continue renal vitamins  HyperK: 6.0 on admission.  - Dialysis.   HTN: Hypertensive urgency range w/ BP as high as 200/137. Marland Kitchen. Has not taken morning medications.  - continue clonidine patch, hydralazine, norvasc, Imdur, Labetalol   DVT prophylaxis: hep  Code Status: full  Family Communication: none  Disposition Plan: pending emergent dialysis  Consults called: Renal  Admission status: inpt    Capria Cartaya J MD [facility]  If 7PM-7AM, please contact night-coverage [email or web address] Password  [email or web address]  12/24/2016, 8:26 AM    Critical Care Time This patient is critically ill requiring frequent monitoring and support due to their life/organ threatening condition. This care involves a high complexity of medical decision making. Greater than 70 minutes spent in direct patient care and coordination.

## 2016-12-24 NOTE — Progress Notes (Signed)
Pharmacy Antibiotic Note  Jeremy GoldenDeanthony Huerta is a 21 y.o. male admitted on 12/24/2016 with pneumonia.  Pharmacy has been consulted for vancomycin dosing. Last HD session 5 days prior to admission. WBC 9.7, afebrile  Plan: Vancomycin 1750mg  IV load then 1000mg  with every hemodialysis.  Goal trough 15-20 mcg/mL.  Weight: 197 lb 1.5 oz (89.4 kg)  Temp (24hrs), Avg:99.1 F (37.3 C), Min:99.1 F (37.3 C), Max:99.1 F (37.3 C)   Recent Labs Lab 12/24/16 0448 12/24/16 0523  WBC 9.7  --   CREATININE 21.80* >18.00*    CrCl cannot be calculated (This lab value cannot be used to calculate CrCl because it is not a number: >18.00).    No Known Allergies   Thank you for allowing pharmacy to be a part of this patient's care.  Toniann Failony L Derran Sear 12/24/2016 8:47 AM

## 2016-12-24 NOTE — ED Provider Notes (Signed)
[facility]-EMERGENCY DEPT Provider Note   CSN: [id number] Arrival date & time: 12/24/16  0429     History   Chief Complaint Chief Complaint  Patient presents with  . Emesis  . Abdominal Pain  . Chest Pain    HPI Jeremy Huerta is a 21 y.o. male.  Patient is a 21 year old male with history of end-stage renal disease on hemodialysis presenting for evaluation of shortness of breath. He apparently missed his last dialysis session, and his most recent session was 5 days ago. He became acutely short of breath this evening and presents by EMS. He is coughing up white frothy material. He denies any fevers or chills. Remainder of history of present illness is difficult to obtain secondary to respiratory distress.   The history is provided by the patient.    Past Medical History:  Diagnosis Date  . Acute respiratory failure (HCC)   . ESRD (end stage renal disease) (HCC)   . FSGS (focal segmental glomerulosclerosis)   . Hypertension   . Nausea & vomiting 11/2016  . Renal disorder    on dialysis    Patient Active Problem List   Diagnosis Date Noted  . Hypertensive urgency 12/02/2016  . Leukocytosis 12/02/2016  . Nausea & vomiting 12/02/2016  . Hyperkalemia 12/02/2016  . Acute respiratory failure (HCC) 04/22/2016  . Diarrhea 04/22/2016  . HCAP (healthcare-associated pneumonia) 04/22/2016  . Tachycardia 04/22/2016  . Renal disorder   . Hypertension   . ESRD (end stage renal disease) [facility](HCC)     Past Surgical History:  Procedure Laterality Date  . AV FISTULA PLACEMENT  12/2015  . RENAL BIOPSY  2013       Home Medications    Prior to Admission medications   Medication Sig Start Date End Date Taking? Authorizing Provider  amLODipine (NORVASC) 10 MG tablet Take 10 mg by mouth daily.  07/15/14   [provider]  B Complex-C-Folic Acid (B COMPLEX-VITAMIN C-FOLIC ACID) 1 MG tablet Take 1 tablet by mouth daily. 04/30/16   [provider]  cinacalcet (SENSIPAR) 30  MG tablet Take 30 mg by mouth daily.    [provider]  cinacalcet (SENSIPAR) 30 MG tablet Take 2 tablets (60 mg total) by mouth daily with supper. 12/04/16   Rolly SalterPatel, Pranav M, MD  cloNIDine (CATAPRES-TTS-1) 0.1 mg/24hr patch Place 0.1 mg onto the skin once a week.    [provider]  hydrALAZINE (APRESOLINE) 100 MG tablet Take 100 mg by mouth 3 (three) times daily.  04/13/16   [provider]  isosorbide mononitrate (IMDUR) 60 MG 24 hr tablet Take 60 mg by mouth daily. 12/31/15   [provider]  labetalol (NORMODYNE) 200 MG tablet Take 400 mg by mouth 3 (three) times daily. 03/16/16   [provider]  loperamide (IMODIUM A-D) 2 MG tablet Take 1 tablet (2 mg total) by mouth 4 (four) times daily as needed for diarrhea or loose stools. 02/13/16   Vanetta MuldersZackowski, Scott, MD  metoCLOPramide (REGLAN) 10 MG tablet Take 1 tablet (10 mg total) by mouth 3 (three) times daily as needed for nausea (headache / nausea). 10/27/16   Everlene Farrieransie, William, PA-C  ondansetron (ZOFRAN ODT) 4 MG disintegrating tablet Take 1 tablet (4 mg total) by mouth every 8 (eight) hours as needed for nausea or vomiting. 12/02/16   Ward, Chase PicketJaime Pilcher, PA-C  sevelamer (RENAGEL) 800 MG tablet Take 2,400 mg by mouth 3 (three) times daily with meals.     [provider]  Family History Family History  Problem Relation Age of Onset  . Hypertension Mother   . Diabetes Father   . Hypertension Father   . Kidney disease Father   . Hypertension Maternal Grandmother   . Diabetes Paternal Grandmother   . Hypertension Paternal Grandmother     Social History Social History  Substance Use Topics  . Smoking status: Never Smoker  . Smokeless tobacco: Never Used  . Alcohol use No     Allergies   Patient has no known allergies.   Review of Systems Review of Systems  Unable to perform ROS: Acuity of condition     Physical Exam Updated Vital Signs BP (!) 194/134   Pulse (!) 105    Temp 99.1 F (37.3 C) (Oral)   Resp (!) 22   Wt 89.4 kg (197 lb 1.5 oz)   SpO2 91%   BMI 26.73 kg/m   Physical Exam  Constitutional: He is oriented to person, place, and time. He appears well-developed and well-nourished. He appears distressed.  Patient is a 21 year old male who appears dyspneic, anxious, and breathing rapidly.  HENT:  Head: Normocephalic and atraumatic.  Mouth/Throat: Oropharynx is clear and moist.  Neck: Normal range of motion. Neck supple.  Cardiovascular: Normal rate and regular rhythm.  Exam reveals no friction rub.   No murmur heard. Pulmonary/Chest: He is in respiratory distress. He has no wheezes. He has no rales.  Patient is in moderate to severe respiratory distress. He has rales in the bases bilaterally. He has coughing up a whitish pink frothy material.  Abdominal: Soft. Bowel sounds are normal. He exhibits no distension. There is no tenderness.  Musculoskeletal: Normal range of motion. He exhibits no edema.  Neurological: He is alert and oriented to person, place, and time. Coordination normal.  Skin: Skin is warm and dry. He is not diaphoretic.  Nursing note and vitals reviewed.    ED Treatments / Results  Labs (all labs ordered are listed, but only abnormal results are displayed) Labs Reviewed  BASIC METABOLIC PANEL  CBC WITH DIFFERENTIAL/PLATELET  LIPASE, BLOOD  HEPATIC FUNCTION PANEL  I-STAT CHEM 8, ED    EKG  EKG Interpretation  Date/Time:  Tuesday December 24 2016 04:50:21 EDT Ventricular Rate:  104 PR Interval:    QRS Duration: 77 QT Interval:  328 QTC Calculation: 432 R Axis:   78 Text Interpretation:  Sinus tachycardia Probable left atrial enlargement Early Repolarization Confirmed by Geoffery Lyons ([id number]) on 12/24/2016 5:08:11 AM       Radiology No results found.  Procedures Procedures (including critical care time)  Medications Ordered in ED Medications  ondansetron (ZOFRAN) injection 4 mg (4 mg Intravenous Given 12/24/16  0505)     Initial Impression / Assessment and Plan / ED Course  I have reviewed the triage vital signs and the nursing notes.  Pertinent labs & imaging results that were available during my care of the patient were reviewed by me and considered in my medical decision making (see chart for details).  Patient presents with shortness of breath after skipping his most recent dialysis session. He appears to be in florid pulmonary edema. He was placed on BiPAP which seemed to help somewhat.  Is also found to have a potassium of 6.0 with minimal EKG changes. He was given calcium along with D50 and insulin.  I've discussed the care with Dr. Lowell Guitar from nephrology. He has asked me to admit the patient to the hospitalist service and he plans to perform dialysis  in the morning.  CRITICAL CARE Performed by: Geoffery Lyons Total critical care time: 45 minutes Critical care time was exclusive of separately billable procedures and treating other patients. Critical care was necessary to treat or prevent imminent or life-threatening deterioration. Critical care was time spent personally by me on the following activities: development of treatment plan with patient and/or surrogate as well as nursing, discussions with consultants, evaluation of patient's response to treatment, examination of patient, obtaining history from patient or surrogate, ordering and performing treatments and interventions, ordering and review of laboratory studies, ordering and review of radiographic studies, pulse oximetry and re-evaluation of patient's condition.   Final Clinical Impressions(s) / ED Diagnoses   Final diagnoses:  None    New Prescriptions New Prescriptions   No medications on file     Geoffery Lyons, MD 12/24/16 [phone]

## 2016-12-24 NOTE — Consult Note (Signed)
[facility] Renal Consultation Note    Indication for Consultation:  Management of ESRD/hemodialysis, anemia, hypertension/volume, and secondary hyperparathyroidism. PCP:  HPI: Jeremy Huerta is a 21 y.o. male with ESRD (d/t FSGS) and HTN who is being admitted with acute respiratory failure/pulmonary edema.  Jeremy Huerta came to the ED with shortness of breath which started yesterday. Unable to get a full history, as he is drowsy and on bi-pap. Per notes, he missed his dialysis on Saturday, last treatment was 12/19/16. In addition the the dyspnea, he reported cough and chest discomfort. In the ED, he was found to be hypoxic requiring bi-pap. BP was very high, requiring IV hydralazine. He had a low grade temp (99.1). Labs showed K 6.1, Hgb 10.5. CXR with asymmetric pulmonary edema with some concern for pneumonia. He has been started on Vancomycin and Cefepime.  Usually dialyzes TTS at [facility], last HD 8/2. No recent AVF issues.  Past Medical History:  Diagnosis Date  . Acute respiratory failure (HCC)   . ESRD (end stage renal disease) (HCC)   . FSGS (focal segmental glomerulosclerosis)   . Hypertension   . Nausea & vomiting 11/2016  . Renal disorder    on dialysis   Past Surgical History:  Procedure Laterality Date  . AV FISTULA PLACEMENT  12/2015  . RENAL BIOPSY  2013   Family History  Problem Relation Age of Onset  . Hypertension Mother   . Diabetes Father   . Hypertension Father   . Kidney disease Father   . Hypertension Maternal Grandmother   . Diabetes Paternal Grandmother   . Hypertension Paternal Grandmother    Social History:  reports that he has never smoked. He has never used smokeless tobacco. He reports that he does not drink alcohol or use drugs.  ROS: Limited due to drowsiness. + chest pain, dyspnea. No fever. No N/V/D. Physical Exam: Vitals:   12/24/16 0630 12/24/16 0645 12/24/16 0715 12/24/16 0730  BP: (!) 188/117 (!) 181/118 (!)  169/116 (!) 169/120  Pulse: (!) 101 98 95 97  Resp: (!) 34 (!) 31 (!) 29 (!) 28  Temp:      TempSrc:      SpO2: 100% 98% 99% 99%  Weight:         General: Young male, drowsy. On bi-pap. Head: Normocephalic. Neck: Supple without lymphadenopathy/masses. JVD elevated. Lungs: + Bibasilar rales. Heart: RRR; no murmur. Musculoskeletal:  Strength and tone appear normal for age. Lower extremities: No LE edema, no obvious open wounds. Neuro: Alert and oriented X 3. Moves all extremities spontaneously. Dialysis Access: LUE AVF + thrill  No Known Allergies Prior to Admission medications   Medication Sig Start Date End Date Taking? Authorizing Provider  amLODipine (NORVASC) 10 MG tablet Take 10 mg by mouth daily.  07/15/14   [provider]  B Complex-C-Folic Acid (B COMPLEX-VITAMIN C-FOLIC ACID) 1 MG tablet Take 1 tablet by mouth daily. 04/30/16   [provider]  cinacalcet (SENSIPAR) 30 MG tablet Take 30 mg by mouth daily.    [provider]  cinacalcet (SENSIPAR) 30 MG tablet Take 2 tablets (60 mg total) by mouth daily with supper. 12/04/16   Rolly Salter, MD  cloNIDine (CATAPRES-TTS-1) 0.1 mg/24hr patch Place 0.1 mg onto the skin once a week.    [provider]  hydrALAZINE (APRESOLINE) 100 MG tablet Take 100 mg by mouth 3 (three) times daily.  04/13/16   [provider]  isosorbide mononitrate (IMDUR) 60 MG 24  hr tablet Take 60 mg by mouth daily. 12/31/15   [provider]  labetalol (NORMODYNE) 200 MG tablet Take 400 mg by mouth 3 (three) times daily. 03/16/16   [provider]  loperamide (IMODIUM A-D) 2 MG tablet Take 1 tablet (2 mg total) by mouth 4 (four) times daily as needed for diarrhea or loose stools. 02/13/16   Vanetta MuldersZackowski, Scott, MD  metoCLOPramide (REGLAN) 10 MG tablet Take 1 tablet (10 mg total) by mouth 3 (three) times daily as needed for nausea (headache / nausea). 10/27/16   Everlene Farrieransie, William, PA-C  ondansetron (ZOFRAN  ODT) 4 MG disintegrating tablet Take 1 tablet (4 mg total) by mouth every 8 (eight) hours as needed for nausea or vomiting. 12/02/16   Ward, Chase PicketJaime Pilcher, PA-C  sevelamer (RENAGEL) 800 MG tablet Take 2,400 mg by mouth 3 (three) times daily with meals.     [provider]   Current Facility-Administered Medications  Medication Dose Route Frequency Provider Last Rate Last Dose  . acetaminophen (TYLENOL) tablet 650 mg  650 mg Oral Q6H PRN Ozella RocksMerrell, David J, MD       Or  . acetaminophen (TYLENOL) suppository 650 mg  650 mg Rectal Q6H PRN Ozella RocksMerrell, David J, MD      . amLODipine (NORVASC) tablet 10 mg  10 mg Oral Daily Ozella RocksMerrell, David J, MD      . ceFEPIme (MAXIPIME) 1 g in dextrose 5 % 50 mL IVPB  1 g Intravenous Q24H Ozella RocksMerrell, David J, MD      . cinacalcet [facility](SENSIPAR) tablet 60 mg  60 mg Oral Q supper Ozella RocksMerrell, David J, MD      . cloNIDine (CATAPRES - Dosed in mg/24 hr) patch 0.1 mg  0.1 mg Transdermal Weekly Ozella RocksMerrell, David J, MD      . heparin injection 5,000 Units  5,000 Units Subcutaneous Q8H Ozella RocksMerrell, David J, MD      . hydrALAZINE (APRESOLINE) injection 5-10 mg  5-10 mg Intravenous Q4H PRN Ozella RocksMerrell, David J, MD   5 mg at 12/24/16 0855  . hydrALAZINE (APRESOLINE) tablet 100 mg  100 mg Oral TID Ozella RocksMerrell, David J, MD      . isosorbide mononitrate (IMDUR) 24 hr tablet 60 mg  60 mg Oral Daily Ozella RocksMerrell, David J, MD      . labetalol (NORMODYNE) tablet 400 mg  400 mg Oral TID Ozella RocksMerrell, David J, MD      . loperamide (IMODIUM A-D) tablet 2 mg  2 mg Oral QID PRN Ozella RocksMerrell, David J, MD      . metoCLOPramide (REGLAN) tablet 10 mg  10 mg Oral TID PRN Ozella RocksMerrell, David J, MD      . promethazine (PHENERGAN) tablet 12.5 mg  12.5 mg Oral Q6H PRN Ozella RocksMerrell, David J, MD      . sevelamer carbonate (RENVELA) tablet 800 mg  800 mg Oral TID WC Ozella RocksMerrell, David J, MD      . vancomycin (VANCOCIN) 1,750 mg in sodium chloride 0.9 % 500 mL IVPB  1,750 mg Intravenous Once Sampson SiMancheril, Benjamin G, RPH      . vancomycin (VANCOCIN) IVPB  1000 mg/200 mL premix  1,000 mg Intravenous Q T,Th,Sa-HD Ozella RocksMerrell, David J, MD       Current Outpatient Prescriptions  Medication Sig Dispense Refill  . amLODipine (NORVASC) 10 MG tablet Take 10 mg by mouth daily.   5  . B Complex-C-Folic Acid (B COMPLEX-VITAMIN C-FOLIC ACID) 1 MG tablet Take 1 tablet by mouth daily.    . cinacalcet (  SENSIPAR) 30 MG tablet Take 30 mg by mouth daily.    . cinacalcet (SENSIPAR) 30 MG tablet Take 2 tablets (60 mg total) by mouth daily with supper. 60 tablet 0  . cloNIDine (CATAPRES-TTS-1) 0.1 mg/24hr patch Place 0.1 mg onto the skin once a week.    . hydrALAZINE (APRESOLINE) 100 MG tablet Take 100 mg by mouth 3 (three) times daily.   5  . isosorbide mononitrate (IMDUR) 60 MG 24 hr tablet Take 60 mg by mouth daily.  0  . labetalol (NORMODYNE) 200 MG tablet Take 400 mg by mouth 3 (three) times daily.    Marland Kitchen loperamide (IMODIUM A-D) 2 MG tablet Take 1 tablet (2 mg total) by mouth 4 (four) times daily as needed for diarrhea or loose stools. 30 tablet 0  . metoCLOPramide (REGLAN) 10 MG tablet Take 1 tablet (10 mg total) by mouth 3 (three) times daily as needed for nausea (headache / nausea). 12 tablet 0  . ondansetron (ZOFRAN ODT) 4 MG disintegrating tablet Take 1 tablet (4 mg total) by mouth every 8 (eight) hours as needed for nausea or vomiting. 10 tablet 0  . sevelamer (RENAGEL) 800 MG tablet Take 2,400 mg by mouth 3 (three) times daily with meals.      Labs: Basic Metabolic Panel:  Recent Labs Lab 12/24/16 0448 12/24/16 0523 12/24/16 0900  NA 140 139  --   K 6.1* 6.0*  --   CL 104 107  --   CO2 20*  --   --   GLUCOSE 89 88  --   BUN 83* 77*  --   CREATININE 21.80* >18.00*  --   CALCIUM 8.7*  --   --   PHOS  --   --  5.1*   Liver Function Tests:  Recent Labs Lab 12/24/16 0448  AST 21  ALT 22  ALKPHOS 69  BILITOT 0.6  PROT 6.4*  ALBUMIN 3.3*    Recent Labs Lab 12/24/16 0448  LIPASE 40   CBC:  Recent Labs Lab 12/24/16 0448  12/24/16 0523  WBC 9.7  --   NEUTROABS 7.2  --   HGB 9.6* 10.5*  HCT 30.2* 31.0*  MCV 86.8  --   PLT 364  --    Studies/Results: Dg Chest Port 1 View  Result Date: 12/24/2016 CLINICAL DATA:  Acute onset of shortness of breath. Initial encounter. EXAM: PORTABLE CHEST 1 VIEW COMPARISON:  Chest radiograph performed 11/19/2016 FINDINGS: Bibasilar airspace opacities, right greater than left, raise concern for multifocal pneumonia. Pulmonary edema could have a similar appearance. No pleural effusion or pneumothorax is seen. The cardiomediastinal silhouette is borderline enlarged. No acute osseous abnormalities are seen. IMPRESSION: 1. Bibasilar airspace opacities, right greater than left, raise concern for multifocal pneumonia. Pulmonary edema could have a similar appearance. 2. Borderline cardiomegaly. Electronically Signed   By: Roanna Raider M.D.   On: 12/24/2016 05:13    Dialysis Orders:  TTS at [facility] 4hr, BFR 400, DFR 800, 2K/2.25Ca bath, EDW 88kg, AVF, Heparin 4600 bolus - Mircera IV q 2 weeks (last given 8/2) - Hectoral IV q HD - Venofer 100mg  x 10 ordered (s/p 6 doses given so far). Tsat 24% on 7/26.  Assessment/Plan: 1.  Acute respiratory failure/pulmonary edema: On bi-pap, will plan for HD urgently today to correct volume and hyperkalemia. 2.  Hyperkalemia: See above, for HD today. 3.  Possible pneumonia (v. fluid alone on CXR): Empirically on Vanc/Cefepime. BCx pending. 4.  ESRD: Continue TTS  schedule. Likely will need EDW lowering on discharge. 5.  Hypertension/volume: BP high/hypervolemic, plan for 4L UF today as tolerated.  6.  Anemia: Hgb 10.5. Not due for ESA yet, holding IV iron d/t possible infection. 7.  Metabolic bone disease: Ca/Phos ok. Continue Renvela as binder and VDRA. 8.  Nutrition: Alb 3.3, adding Nepro supps.  Ozzie Hoyle, PA-C 12/24/2016, 9:58 AM  [facility] Pager: [phone]  Pt seen, examined and agree w  A/P as above. ESRD pt here with resp distress and pulm edema.  Plan HD asap upstairs.  Get f/u CXR in am.  Will follow.  Vinson Moselle MD [employer] pager [phone]   12/24/2016, 1:14 PM

## 2016-12-24 NOTE — Progress Notes (Signed)
Transported patient from ED to dialysis on [facility] BIPAP. Pt tol well. No complications.

## 2016-12-24 NOTE — Procedures (Signed)
   I was present at this dialysis session, have reviewed the session itself and made  appropriate changes Rob Detrell Umscheid MD [facility] pager [phone]   12/24/2016, 2:11 PM    

## 2016-12-24 NOTE — ED Triage Notes (Signed)
Pt presents from home with [facility] for vomiting, CP, and abd pain that began yesterday; pt states he is a HD patient with T/TH/SAT schedule (last treatment was TH)

## 2016-12-25 ENCOUNTER — Encounter (HOSPITAL_COMMUNITY): Payer: Self-pay | Admitting: *Deleted

## 2016-12-25 LAB — RESPIRATORY PANEL BY PCR
ADENOVIRUS-RVPPCR: NOT DETECTED
BORDETELLA PERTUSSIS-RVPCR: NOT DETECTED
CHLAMYDOPHILA PNEUMONIAE-RVPPCR: NOT DETECTED
CORONAVIRUS NL63-RVPPCR: NOT DETECTED
Coronavirus 229E: NOT DETECTED
Coronavirus HKU1: NOT DETECTED
Coronavirus OC43: NOT DETECTED
INFLUENZA A-RVPPCR: NOT DETECTED
INFLUENZA B-RVPPCR: NOT DETECTED
MYCOPLASMA PNEUMONIAE-RVPPCR: NOT DETECTED
Metapneumovirus: NOT DETECTED
PARAINFLUENZA VIRUS 3-RVPPCR: NOT DETECTED
PARAINFLUENZA VIRUS 4-RVPPCR: NOT DETECTED
Parainfluenza Virus 1: NOT DETECTED
Parainfluenza Virus 2: NOT DETECTED
RHINOVIRUS / ENTEROVIRUS - RVPPCR: NOT DETECTED
Respiratory Syncytial Virus: NOT DETECTED

## 2016-12-25 LAB — COMPREHENSIVE METABOLIC PANEL
ALBUMIN: 2.8 g/dL — AB (ref 3.5–5.0)
ALK PHOS: 56 U/L (ref 38–126)
ALT: 20 U/L (ref 17–63)
AST: 19 U/L (ref 15–41)
Anion gap: 14 (ref 5–15)
BUN: 42 mg/dL — AB (ref 6–20)
CALCIUM: 8.1 mg/dL — AB (ref 8.9–10.3)
CHLORIDE: 98 mmol/L — AB (ref 101–111)
CO2: 24 mmol/L (ref 22–32)
CREATININE: 14.07 mg/dL — AB (ref 0.61–1.24)
GFR calc non Af Amer: 4 mL/min — ABNORMAL LOW (ref >60)
GFR, EST AFRICAN AMERICAN: 5 mL/min — AB (ref >60)
GLUCOSE: 127 mg/dL — AB (ref 65–99)
Potassium: 4.9 mmol/L (ref 3.5–5.1)
SODIUM: 136 mmol/L (ref 135–145)
Total Bilirubin: 0.6 mg/dL (ref 0.3–1.2)
Total Protein: 5.8 g/dL — ABNORMAL LOW (ref 6.5–8.1)

## 2016-12-25 LAB — CBC
HCT: 29 % — ABNORMAL LOW (ref 39.0–52.0)
HEMOGLOBIN: 9.2 g/dL — AB (ref 13.0–17.0)
MCH: 27.5 pg (ref 26.0–34.0)
MCHC: 31.7 g/dL (ref 30.0–36.0)
MCV: 86.8 fL (ref 78.0–100.0)
PLATELETS: 328 10*3/uL (ref 150–400)
RBC: 3.34 MIL/uL — AB (ref 4.22–5.81)
RDW: 14.9 % (ref 11.5–15.5)
WBC: 6.9 10*3/uL (ref 4.0–10.5)

## 2016-12-25 LAB — MRSA PCR SCREENING: MRSA by PCR: NEGATIVE

## 2016-12-25 MED ORDER — SODIUM CHLORIDE 0.9 % IV SOLN: 100.0000 mL | INTRAVENOUS | Status: DC | PRN

## 2016-12-25 MED ORDER — LIDOCAINE-PRILOCAINE 2.5-2.5 % EX CREA: 1.0000 "application " | TOPICAL_CREAM | CUTANEOUS | Status: DC | PRN

## 2016-12-25 MED ORDER — CLONIDINE HCL 0.2 MG PO TABS
0.2000 mg | ORAL_TABLET | Freq: Three times a day (TID) | ORAL | Status: DC
  Administered 2016-12-25 – 2016-12-26 (×4): 0.2 mg via ORAL
  Filled 2016-12-25 (×5): qty 1

## 2016-12-25 MED ORDER — CLONIDINE HCL 0.1 MG PO TABS
0.1000 mg | ORAL_TABLET | Freq: Once | ORAL | Status: AC
  Administered 2016-12-25: 0.1 mg via ORAL
  Filled 2016-12-25: qty 1

## 2016-12-25 MED ORDER — PENTAFLUOROPROP-TETRAFLUOROETH EX AERO: 1.0000 "application " | INHALATION_SPRAY | CUTANEOUS | Status: DC | PRN

## 2016-12-25 MED ORDER — ISOSORBIDE MONONITRATE ER 60 MG PO TB24: 90.0000 mg | ORAL_TABLET | Freq: Every day | ORAL | Status: DC

## 2016-12-25 MED ORDER — METOCLOPRAMIDE HCL 10 MG PO TABS: 10.0000 mg | ORAL_TABLET | Freq: Three times a day (TID) | ORAL | Status: DC | PRN

## 2016-12-25 MED ORDER — HEPARIN SODIUM (PORCINE) 1000 UNIT/ML DIALYSIS: 4000.0000 [IU] | Freq: Once | INTRAMUSCULAR | Status: DC

## 2016-12-25 MED ORDER — LIDOCAINE HCL (PF) 1 % IJ SOLN: 5.0000 mL | INTRAMUSCULAR | Status: DC | PRN

## 2016-12-25 MED ORDER — HEPARIN SODIUM (PORCINE) 1000 UNIT/ML DIALYSIS: 1000.0000 [IU] | INTRAMUSCULAR | Status: DC | PRN

## 2016-12-25 MED ORDER — VANCOMYCIN HCL IN DEXTROSE 1-5 GM/200ML-% IV SOLN: 1000.0000 mg | INTRAVENOUS | Status: DC

## 2016-12-25 MED ORDER — ALTEPLASE 2 MG IJ SOLR: 2.0000 mg | Freq: Once | INTRAMUSCULAR | Status: DC | PRN

## 2016-12-25 NOTE — Progress Notes (Signed)
Pharmacy Antibiotic Note  Jeremy Huerta is a 21 y.o. male admitted on 12/24/2016 with pneumonia.  Pharmacy has been consulted for vancomycin dosing. Last HD session 5 days prior to admission. WBC 9.7, afebrile  Went to HD 8/7 -  3hrs, BFR 400 - vancomycin appropriately redosed after session Extra HD today (8/8) - appears to be tolerating session well thus far.  Plan: Will give vancomycin 1g x 1 today at end of HD session. Continue 1000mg  with every hemodialysis.  Goal trough 15-20 mcg/mL.  Height: 6' (182.9 cm) Weight: 200 lb 6.4 oz (90.9 kg) IBW/kg (Calculated) : 77.6  Temp (24hrs), Avg:98.5 F (36.9 C), Min:97.6 F (36.4 C), Max:99 F (37.2 C)   Recent Labs Lab 12/24/16 0448 12/24/16 0523 12/25/16 0316  WBC 9.7  --  6.9  CREATININE 21.80* >18.00* 14.07*    Estimated Creatinine Clearance: 9 mL/min (A) (by C-G formula based on SCr of 14.07 mg/dL (H)).    No Known Allergies   Vanc 8/7>> Cefepime 8/7>>  Microbiology results:  8/7 BCx x2 : sent  8/7 Resp panel PCR: sent 8/7 Sputum Cx: sent 8/7 MRSA PCR - negative  Thank you for allowing pharmacy to be a part of this patient's care.  Tad MooreJessica Jenascia Bumpass, Pharm D, BCPS  Clinical Pharmacist Pager [phone]  12/25/2016 11:24 AM

## 2016-12-25 NOTE — Progress Notes (Signed)
Pt has clonidine patch on right shoulder, he states it was placed 3 days ago. Pt up eating breakfast and headed to HD. Marisue Ivanobyn Calee Nugent RN

## 2016-12-25 NOTE — Progress Notes (Signed)
[facility] Progress Note  Subjective: no new c/o, 4 L off on HD yesterday w/o BP drop or cramping.   Vitals:   12/24/16 2326 12/25/16 0120 12/25/16 0331 12/25/16 0615  BP: (!) 179/129 (!) 167/96 (!) 165/113 (!) 177/126  Pulse: 82 85 84 88  Resp:  (!) 24 (!) 25 (!) 27  Temp:   98.3 F (36.8 C)   TempSrc:   Oral   SpO2: 97% 96% 96% 97%  Weight:      Height:        Inpatient medications: . amLODipine  10 mg Oral Daily  . cinacalcet  60 mg Oral Q supper  . cloNIDine  0.1 mg Transdermal Weekly  . doxercalciferol  7 mcg Intravenous Q T,Th,Sa-HD  . feeding supplement (NEPRO CARB STEADY)  237 mL Oral BID BM  . heparin  5,000 Units Subcutaneous Q8H  . hydrALAZINE  100 mg Oral TID  . isosorbide mononitrate  60 mg Oral Daily  . labetalol  400 mg Oral TID  . sevelamer carbonate  2,400 mg Oral TID WC   . ceFEPime (MAXIPIME) IV    . vancomycin 1,000 mg (12/24/16 1354)   acetaminophen **OR** acetaminophen, hydrALAZINE, metoCLOPramide, promethazine  Exam: Alert, no distress No jvd Chest clear bilat RRR no mrg No leg edema LUE AVF+bruit NF, Ox3  Dialysis: T[facility] 4h  400/800  2/2.25 bath  88kg  Hep 4600   AVF - Mircera 50mcg IV q 2 weeks (last given 8/2) - Hectoral 7mcg IV q HD - Venofer 100mg  x 10 ordered (s/p 6 doses given so      Impression: 1  Acute resp failure/ acute pulm edema - d/t vol overload. Better, plan extra HD today , lower vol further 2  ESRD usual HD TTS 3  Possible PNA - will get CXR after HD today 4  HTN on 4 bp meds 5  Anemia - Hb stable 10 6  MBD cont meds  Plan - as above   Jeremy Moselleob Vanecia Limpert MD [facility] pager [phone]   12/25/2016, 8:27 AM    Recent Labs Lab 12/24/16 0448 12/24/16 0523 12/24/16 0900 12/25/16 0316  NA 140 139  --  136  K 6.1* 6.0*  --  4.9  CL 104 107  --  98*  CO2 20*  --   --  24  GLUCOSE 89 88  --  127*  BUN 83* 77*  --  42*  CREATININE 21.80* >18.00*  --  14.07*  CALCIUM 8.7*  --    --  8.1*  PHOS  --   --  5.1*  --     Recent Labs Lab 12/24/16 0448 12/25/16 0316  AST 21 19  ALT 22 20  ALKPHOS 69 56  BILITOT 0.6 0.6  PROT 6.4* 5.8*  ALBUMIN 3.3* 2.8*    Recent Labs Lab 12/24/16 0448 12/24/16 0523 12/25/16 0316  WBC 9.7  --  6.9  NEUTROABS 7.2  --   --   HGB 9.6* 10.5* 9.2*  HCT 30.2* 31.0* 29.0*  MCV 86.8  --  86.8  PLT 364  --  328   Iron/TIBC/Ferritin/ %Sat No results found for: IRON, TIBC, FERRITIN, IRONPCTSAT

## 2016-12-25 NOTE — Progress Notes (Signed)
[facility] TEAM 1 - Stepdown/ICU TEAM  [name]  YQM:[id number] DOB: 1996-04-01 DOA: 12/24/2016 PCP: [name], MD    Brief Narrative:  21 y.o. 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  There was reportedly a preceding several day history of cough and URI type symptoms. He has missed his most recent HD session.    In the ED he required BIPAP support due to volume overload and hypoxia.  Subjective: The patient has been complaining to the staff that he is on a renal restricted diet.  I have counseled him on the absolute need to adhere to a renal appropriate diet and he now voices understanding.  He states he does not yet feel back to his normal.  He denies severe shortness of breath but does report that he feels weak and has a severe headache.  He denies chest pain.  Assessment & Plan:  Acute hypoxic respiratory failure - pulmonary edema  4L successfully removed in HD 8/7 - to have repeat HD today, then will be back on schedule on 8/9 - appears to be due to noncompliance with dialysis and likely diet as an outpatient - w/ no fever and no elevated WBC I do not think he has a bacterial PNA - will stop abx and follow   ESRD on HD Nephrology following and attending to dialysis  Uncontrolled HTN - Hypertensive urgency  If blood pressure remains elevated after his dialysis treatment today we will adjust his blood pressure medication further  Hyperkalemia Corrected with serial dialysis treatments  DVT prophylaxis: SQ heparin  Code Status: FULL CODE Family Communication: no family present at time of exam  Disposition Plan: possible d/c after HD 8/9  Consultants:  Nephrology   Procedures: none  Antimicrobials:  Vanc 8/7 > 8/8 Cefepime 8/7 > 8/8  Objective: Blood pressure (!) 201/100, pulse 89, temperature 98.7 F (37.1 C), temperature source Oral, resp. rate (!) 28, height 6' (1.829 m), weight 90.9 kg (200  lb 6.4 oz), SpO2 98 %.  Intake/Output Summary (Last 24 hours) at 12/25/16 1124 Last data filed at 12/25/16 0331  Gross per 24 hour  Intake              120 ml  Output             3950 ml  Net            -3830 ml   Filed Weights   12/24/16 1503 12/24/16 1545 12/25/16 0930  Weight: 90.6 kg (199 lb 11.8 oz) 89.4 kg (197 lb 1.5 oz) 90.9 kg (200 lb 6.4 oz)    Examination: General: No acute respiratory distress Lungs: Clear to auscultation bilaterally without wheezes or crackles Cardiovascular: Regular rate and rhythm without murmur gallop or rub normal S1 and S2 Abdomen: Nontender, nondistended, soft, bowel sounds positive, no rebound, no ascites, no appreciable mass Extremities: No significant cyanosis, clubbing, or edema bilateral lower extremities  CBC:  Recent Labs Lab 12/24/16 0448 12/24/16 0523 12/25/16 0316  WBC 9.7  --  6.9  NEUTROABS 7.2  --   --   HGB 9.6* 10.5* 9.2*  HCT 30.2* 31.0* 29.0*  MCV 86.8  --  86.8  PLT 364  --  328   Basic Metabolic Panel:  Recent Labs Lab 12/24/16 0448 12/24/16 0523 12/24/16 0900 12/25/16 0316  NA 140 139  --  136  K 6.1* 6.0*  --  4.9  CL 104  107  --  98*  CO2 20*  --   --  24  GLUCOSE 89 88  --  127*  BUN 83* 77*  --  42*  CREATININE 21.80* >18.00*  --  14.07*  CALCIUM 8.7*  --   --  8.1*  MG  --   --  2.6*  --   PHOS  --   --  5.1*  --    GFR: Estimated Creatinine Clearance: 9 mL/min (A) (by C-G formula based on SCr of 14.07 mg/dL (H)).  Liver Function Tests:  Recent Labs Lab 12/24/16 0448 12/25/16 0316  AST 21 19  ALT 22 20  ALKPHOS 69 56  BILITOT 0.6 0.6  PROT 6.4* 5.8*  ALBUMIN 3.3* 2.8*    Recent Labs Lab 12/24/16 0448  LIPASE 40    Recent Results (from the past 240 hour(s))  MRSA PCR Screening     Status: None   Collection Time: 12/24/16  7:00 PM  Result Value Ref Range Status   MRSA by PCR NEGATIVE NEGATIVE Final    Comment:        The GeneXpert MRSA Assay (FDA approved for NASAL  specimens only), is one component of a comprehensive MRSA colonization surveillance program. It is not intended to diagnose MRSA infection nor to guide or monitor treatment for MRSA infections.      Scheduled Meds: . amLODipine  10 mg Oral Daily  . cinacalcet  60 mg Oral Q supper  . cloNIDine  0.1 mg Transdermal Weekly  . doxercalciferol  7 mcg Intravenous Q T,Th,Sa-HD  . feeding supplement (NEPRO CARB STEADY)  237 mL Oral BID BM  . heparin  4,000 Units Dialysis Once in dialysis  . heparin  5,000 Units Subcutaneous Q8H  . hydrALAZINE  100 mg Oral TID  . isosorbide mononitrate  60 mg Oral Daily  . labetalol  400 mg Oral TID  . sevelamer carbonate  2,400 mg Oral TID WC    LOS: 1 day   Lonia BloodJeffrey T. McClung, MD [facility] Office  [phone] Pager - Text Page per Amion as per below:  On-Call/Text Page:      [email or web address]      password [email or web address]  If 7PM-7AM, please contact night-coverage [email or web address] Password [email or web address] 12/25/2016, 11:24 AM

## 2016-12-26 DIAGNOSIS — E875 Hyperkalemia: Secondary | ICD-10-CM

## 2016-12-26 DIAGNOSIS — I10 Essential (primary) hypertension: Secondary | ICD-10-CM

## 2016-12-26 DIAGNOSIS — N186 End stage renal disease: Secondary | ICD-10-CM

## 2016-12-26 DIAGNOSIS — Z992 Dependence on renal dialysis: Secondary | ICD-10-CM

## 2016-12-26 DIAGNOSIS — J9601 Acute respiratory failure with hypoxia: Secondary | ICD-10-CM

## 2016-12-26 MED ORDER — LIDOCAINE-PRILOCAINE 2.5-2.5 % EX CREA: 1.0000 "application " | TOPICAL_CREAM | CUTANEOUS | Status: DC | PRN

## 2016-12-26 MED ORDER — SODIUM CHLORIDE 0.9 % IV SOLN: 100.0000 mL | INTRAVENOUS | Status: DC | PRN

## 2016-12-26 MED ORDER — ALTEPLASE 2 MG IJ SOLR: 2.0000 mg | Freq: Once | INTRAMUSCULAR | Status: DC | PRN

## 2016-12-26 MED ORDER — DOXERCALCIFEROL 4 MCG/2ML IV SOLN
INTRAVENOUS | Status: AC
  Filled 2016-12-26: qty 4

## 2016-12-26 MED ORDER — LIDOCAINE HCL (PF) 1 % IJ SOLN: 5.0000 mL | INTRAMUSCULAR | Status: DC | PRN

## 2016-12-26 MED ORDER — ISOSORBIDE MONONITRATE ER 60 MG PO TB24: 120.0000 mg | ORAL_TABLET | Freq: Every day | ORAL | Status: DC

## 2016-12-26 MED ORDER — ISOSORBIDE MONONITRATE ER 60 MG PO TB24
90.0000 mg | ORAL_TABLET | Freq: Every day | ORAL | Status: DC
  Administered 2016-12-26: 90 mg via ORAL
  Filled 2016-12-26 (×2): qty 1

## 2016-12-26 MED ORDER — HEPARIN SODIUM (PORCINE) 1000 UNIT/ML DIALYSIS: 3000.0000 [IU] | Freq: Once | INTRAMUSCULAR | Status: DC

## 2016-12-26 MED ORDER — PENTAFLUOROPROP-TETRAFLUOROETH EX AERO: 1.0000 "application " | INHALATION_SPRAY | CUTANEOUS | Status: DC | PRN

## 2016-12-26 MED ORDER — HEPARIN SODIUM (PORCINE) 1000 UNIT/ML DIALYSIS: 1000.0000 [IU] | INTRAMUSCULAR | Status: DC | PRN

## 2016-12-26 NOTE — Progress Notes (Signed)
PROGRESS NOTE    Jeremy Huerta  ZOX:[id number] DOB: [birth year] DOA: 12/24/2016 PCP: [name], MD   Brief Narrative:  21 y.o. BM PMHx ESRD on HD T/Th/Sat, FSG as, HTN, Noncompliant   Presents Acute respiratory failure and somnolence and unable to provide detailed history at this time. History obtained by consultation with you and from the patient.  Patient presenting with one day of acute worsening shortness of breath. Reports several day history of cough and URI type symptoms but denies actual fevers. Patient states he's had some chest discomfort with coughing but no true chest pain. Patient states he typically gets dialysis on Tuesday Thursday and Saturday and missed his dialysis session on Saturday. At time of my examination patient also denies rash, abdominal pain, nausea, vomiting, flank pain, neck stiffness, headache, focal neurological deficit.  No further history is able to be obtained.  Subjective: 8/9  A/O4, negative CPK, negative SOB, negative abdominal pain, negative N/V     Assessment & Plan:   Active Problems:   Acute respiratory failure (HCC)   HCAP (healthcare-associated pneumonia)   ESRD (end stage renal disease) (HCC)   Hypertensive urgency   Hyperkalemia  Acute respiratory failure with hypoxia -Pulmonary edema secondary to missing HD session  -Received emergent HD -  ESRD on HD T/Th/Sat -HD today: Unfortunately did not control patients HTN -Continue home medication  Essential HTN -Upon admission BP 200/137 -Unfortunately today's HD did not control BP -Amlodipine 10 mg daily -Clonidine 0.2mg  TID - Imdur 90 mg -Labetalol 400 mg TID  Hyperkalemia -Resolved with HD     DVT prophylaxis: Subcutaneous heparin Code Status: Full Family Communication: None Disposition Plan: DC 8/10 with BP under control   Consultants:  Nephrology    Procedures/Significant Events:   None  VENTILATOR SETTINGS: None   Cultures 8/7 blood  NGTD 8/7MRSA by PCR negative 8/8 respiratory virus panel negative    Antimicrobials: Anti-infectives    Start     Stop   12/25/16 1130  vancomycin (VANCOCIN) IVPB 1000 mg/200 mL premix  Status:  Discontinued     12/25/16 1131   12/24/16 1600  ceFEPIme (MAXIPIME) 1 g in dextrose 5 % 50 mL IVPB  Status:  Discontinued     12/25/16 1716   12/24/16 1200  vancomycin (VANCOCIN) IVPB 1000 mg/200 mL premix  Status:  Discontinued     12/25/16 1131   12/24/16 0900  vancomycin (VANCOCIN) 1,750 mg in sodium chloride 0.9 % 500 mL IVPB     12/24/16 1209       Devices   LINES / TUBES:      Continuous Infusions:   Objective: Vitals:   12/26/16 1400 12/26/16 1438 12/26/16 1535 12/26/16 1621  BP: (!) 207/139 (!) 198/120 (!) 172/105 (!) 168/104  Pulse: 79 74 95 88  Resp:  15 18 (!) 26  Temp:  (!) 97 F (36.1 C) (!) 97.3 F (36.3 C)   TempSrc:  Oral Axillary   SpO2:  96% 96% 96%  Weight:  192 lb 14.4 oz (87.5 kg)    Height:        Intake/Output Summary (Last 24 hours) at 12/26/16 1628 Last data filed at 12/26/16 1438  Gross per 24 hour  Intake              780 ml  Output             2500 ml  Net            -1720 ml  Filed Weights   12/25/16 1249 12/26/16 1148 12/26/16 1438  Weight: 195 lb 15.8 oz (88.9 kg) 198 lb 10.2 oz (90.1 kg) 192 lb 14.4 oz (87.5 kg)    Examination:  General: A/O 4, No acute respiratory distress Eyes: negative scleral hemorrhage, negative anisocoria, negative icterus Neck:  Negative scars, masses, torticollis, lymphadenopathy, JVD Lungs: Clear to auscultation bilaterally without wheezes or crackles Cardiovascular: Regular rate and rhythm without murmur gallop or rub normal S1 and S2 Abdomen: negative abdominal pain, nondistended, positive soft, bowel sounds, no rebound, no ascites, no appreciable mass Extremities: No significant cyanosis, clubbing, or edema bilateral lower extremities Skin: Negative rashes, lesions, ulcers Psychiatric:  Negative  depression, negative anxiety, negative fatigue, negative mania  Central nervous system:  Cranial nerves II through XII intact, tongue/uvula midline, all extremities muscle strength 5/5, sensation intact throughout, negative dysarthria, negative expressive aphasia, negative receptive aphasia.  .     Data Reviewed: Care during the described time interval was provided by me .  I have reviewed this patient's available data, including medical history, events of note, physical examination, and all test results as part of my evaluation. I have personally reviewed and interpreted all radiology studies.  CBC:  Recent Labs Lab 12/24/16 0448 12/24/16 0523 12/25/16 0316  WBC 9.7  --  6.9  NEUTROABS 7.2  --   --   HGB 9.6* 10.5* 9.2*  HCT 30.2* 31.0* 29.0*  MCV 86.8  --  86.8  PLT 364  --  328   Basic Metabolic Panel:  Recent Labs Lab 12/24/16 0448 12/24/16 0523 12/24/16 0900 12/25/16 0316  NA 140 139  --  136  K 6.1* 6.0*  --  4.9  CL 104 107  --  98*  CO2 20*  --   --  24  GLUCOSE 89 88  --  127*  BUN 83* 77*  --  42*  CREATININE 21.80* >18.00*  --  14.07*  CALCIUM 8.7*  --   --  8.1*  MG  --   --  2.6*  --   PHOS  --   --  5.1*  --    GFR: Estimated Creatinine Clearance: 9 mL/min (A) (by C-G formula based on SCr of 14.07 mg/dL (H)). Liver Function Tests:  Recent Labs Lab 12/24/16 0448 12/25/16 0316  AST 21 19  ALT 22 20  ALKPHOS 69 56  BILITOT 0.6 0.6  PROT 6.4* 5.8*  ALBUMIN 3.3* 2.8*    Recent Labs Lab 12/24/16 0448  LIPASE 40   No results for input(s): AMMONIA in the last 168 hours. Coagulation Profile: No results for input(s): INR, PROTIME in the last 168 hours. Cardiac Enzymes: No results for input(s): CKTOTAL, CKMB, CKMBINDEX, TROPONINI in the last 168 hours. BNP (last 3 results) No results for input(s): PROBNP in the last 8760 hours. HbA1C: No results for input(s): HGBA1C in the last 72 hours. CBG: No results for input(s): GLUCAP in the last 168  hours. Lipid Profile: No results for input(s): CHOL, HDL, LDLCALC, TRIG, CHOLHDL, LDLDIRECT in the last 72 hours. Thyroid Function Tests: No results for input(s): TSH, T4TOTAL, FREET4, T3FREE, THYROIDAB in the last 72 hours. Anemia Panel: No results for input(s): VITAMINB12, FOLATE, FERRITIN, TIBC, IRON, RETICCTPCT in the last 72 hours. Urine analysis:    Component Value Date/Time   COLORURINE YELLOW 07/20/2014 0114   APPEARANCEUR CLOUDY (A) 07/20/2014 0114   LABSPEC 1.013 07/20/2014 0114   PHURINE 6.0 07/20/2014 0114   GLUCOSEU NEGATIVE 07/20/2014 0114  HGBUR SMALL (A) 07/20/2014 0114   BILIRUBINUR NEGATIVE 07/20/2014 0114   KETONESUR NEGATIVE 07/20/2014 0114   PROTEINUR >300 (A) 07/20/2014 0114   UROBILINOGEN 0.2 07/20/2014 0114   NITRITE NEGATIVE 07/20/2014 0114   LEUKOCYTESUR NEGATIVE 07/20/2014 0114   Sepsis Labs: @LABRCNTIP (procalcitonin:4,lacticidven:4)  ) Recent Results (from the past 240 hour(s))  Culture, blood (routine x 2) Call MD if unable to obtain prior to antibiotics being given     Status: None (Preliminary result)   Collection Time: 12/24/16  8:51 AM  Result Value Ref Range Status   Specimen Description BLOOD RIGHT FOREARM  Final   Special Requests   Final    BOTTLES DRAWN AEROBIC ONLY Blood Culture adequate volume   Culture NO GROWTH 1 DAY  Final   Report Status PENDING  Incomplete  Culture, blood (routine x 2) Call MD if unable to obtain prior to antibiotics being given     Status: None (Preliminary result)   Collection Time: 12/24/16  9:00 AM  Result Value Ref Range Status   Specimen Description BLOOD RIGHT HAND  Final   Special Requests   Final    BOTTLES DRAWN AEROBIC ONLY Blood Culture adequate volume   Culture NO GROWTH 1 DAY  Final   Report Status PENDING  Incomplete  MRSA PCR Screening     Status: None   Collection Time: 12/24/16  7:00 PM  Result Value Ref Range Status   MRSA by PCR NEGATIVE NEGATIVE Final    Comment:        The GeneXpert  MRSA Assay (FDA approved for NASAL specimens only), is one component of a comprehensive MRSA colonization surveillance program. It is not intended to diagnose MRSA infection nor to guide or monitor treatment for MRSA infections.   Respiratory Panel by PCR     Status: None   Collection Time: 12/25/16 12:28 AM  Result Value Ref Range Status   Adenovirus NOT DETECTED NOT DETECTED Final   Coronavirus 229E NOT DETECTED NOT DETECTED Final   Coronavirus HKU1 NOT DETECTED NOT DETECTED Final   Coronavirus NL63 NOT DETECTED NOT DETECTED Final   Coronavirus OC43 NOT DETECTED NOT DETECTED Final   Metapneumovirus NOT DETECTED NOT DETECTED Final   Rhinovirus / Enterovirus NOT DETECTED NOT DETECTED Final   Influenza A NOT DETECTED NOT DETECTED Final   Influenza B NOT DETECTED NOT DETECTED Final   Parainfluenza Virus 1 NOT DETECTED NOT DETECTED Final   Parainfluenza Virus 2 NOT DETECTED NOT DETECTED Final   Parainfluenza Virus 3 NOT DETECTED NOT DETECTED Final   Parainfluenza Virus 4 NOT DETECTED NOT DETECTED Final   Respiratory Syncytial Virus NOT DETECTED NOT DETECTED Final   Bordetella pertussis NOT DETECTED NOT DETECTED Final   Chlamydophila pneumoniae NOT DETECTED NOT DETECTED Final   Mycoplasma pneumoniae NOT DETECTED NOT DETECTED Final         Radiology Studies: No results found.      Scheduled Meds: . amLODipine  10 mg Oral Daily  . cinacalcet  60 mg Oral Q supper  . cloNIDine  0.2 mg Oral TID  . doxercalciferol  7 mcg Intravenous Q T,Th,Sa-HD  . feeding supplement (NEPRO CARB STEADY)  237 mL Oral BID BM  . heparin  5,000 Units Subcutaneous Q8H  . hydrALAZINE  100 mg Oral TID  . isosorbide mononitrate  90 mg Oral Daily  . labetalol  400 mg Oral TID  . sevelamer carbonate  2,400 mg Oral TID WC   Continuous Infusions:   LOS: 2  days    Time spent: 40 minutes    WOODS, Roselind MessierURTIS J, MD [facility] Pager [phone]   If 7PM-7AM, please contact  night-coverage [email or web address] Password [email or web address] 12/26/2016, 4:28 PM

## 2016-12-26 NOTE — Care Management Note (Signed)
Case Management Note  Patient Details  Name: Jeremy Huerta MRN: [id number] Date of Birth: [birth year]  Subjective/Objective:       Pt admitted with volume overload - pt has missed HD appts             Action/Plan:  PTA independent from home with roommate. Pt states he goes to the [facility] outpt center.  Pt informed CM that when he misses HD appts it is because he misses his ride (Pt is transported via [facility] to appts).  CM educated pt on the importance with adhering to HD schedule, pt stated "I know".  Pt denied CM needs - CM will continue to follow for discharge needs   Expected Discharge Date:                  Expected Discharge Plan:  Home/Self Care  In-House Referral:     Discharge planning Services  CM Consult  Post Acute Care Choice:    Choice offered to:     DME Arranged:    DME Agency:     HH Arranged:    HH Agency:     Status of Service:  In process, will continue to follow  If discussed at Long Length of Stay Meetings, dates discussed:    Additional Comments:  Cherylann ParrClaxton, Ashlee Player S, RN 12/26/2016, 11:20 AM

## 2016-12-26 NOTE — Progress Notes (Signed)
[facility] Progress Note  Subjective: 2 L off w HD yest, BP's still up.  Breathing back to baseline  Vitals:   12/26/16 0300 12/26/16 0435 12/26/16 0900 12/26/16 1100  BP: (!) 168/116 (!) 168/116 140/90 (!) 168/111  Pulse: 66  78 83  Resp: 13  14 20   Temp: 98 F (36.7 C)  98.5 F (36.9 C)   TempSrc: Oral  Oral   SpO2: 94%  95% 96%  Weight:      Height:        Inpatient medications: . amLODipine  10 mg Oral Daily  . cinacalcet  60 mg Oral Q supper  . cloNIDine  0.2 mg Oral TID  . doxercalciferol  7 mcg Intravenous Q T,Th,Sa-HD  . feeding supplement (NEPRO CARB STEADY)  237 mL Oral BID BM  . heparin  5,000 Units Subcutaneous Q8H  . hydrALAZINE  100 mg Oral TID  . isosorbide mononitrate  90 mg Oral Daily  . labetalol  400 mg Oral TID  . sevelamer carbonate  2,400 mg Oral TID WC    acetaminophen **OR** acetaminophen, hydrALAZINE, metoCLOPramide, promethazine  Exam: Alert, no distress No jvd Chest clear bilat RRR no mrg No leg edema LUE AVF+bruit NF, Ox3  Dialysis: TTS [facility] 4h  400/800  2/2.25 bath  88kg  Hep 4600   AVF - Mircera 50mcg IV q 2 weeks (last given 8/2) - Hectoral 7mcg IV q HD - Venofer 100mg  x 10 ordered (s/p 6 doses given so      Impression: 1  Acute resp failure/ acute pulm edema - resolved w HD. At dry wt now. Plan HD again today to get back on sched and lower vol further as tolerated.  2  ESRD usual HD TTS 3  Possible PNA - abx stopped, not felt to be infectious 4  HTN on 4 bp meds, not well controlled yet 5  Anemia - Hb stable 10 6  MBD cont meds 7  Dispo - per primary  Plan - as above   Jeremy Huerta Jeremy Borrayo MD [facility] pager [phone]   12/26/2016, 11:59 AM    Recent Labs Lab 12/24/16 0448 12/24/16 0523 12/24/16 0900 12/25/16 0316  NA 140 139  --  136  K 6.1* 6.0*  --  4.9  CL 104 107  --  98*  CO2 20*  --   --  24  GLUCOSE 89 88  --  127*  BUN 83* 77*  --  42*  CREATININE 21.80* >18.00*  --  14.07*   CALCIUM 8.7*  --   --  8.1*  PHOS  --   --  5.1*  --     Recent Labs Lab 12/24/16 0448 12/25/16 0316  AST 21 19  ALT 22 20  ALKPHOS 69 56  BILITOT 0.6 0.6  PROT 6.4* 5.8*  ALBUMIN 3.3* 2.8*    Recent Labs Lab 12/24/16 0448 12/24/16 0523 12/25/16 0316  WBC 9.7  --  6.9  NEUTROABS 7.2  --   --   HGB 9.6* 10.5* 9.2*  HCT 30.2* 31.0* 29.0*  MCV 86.8  --  86.8  PLT 364  --  328   Iron/TIBC/Ferritin/ %Sat No results found for: IRON, TIBC, FERRITIN, IRONPCTSAT

## 2016-12-27 DIAGNOSIS — J810 Acute pulmonary edema: Secondary | ICD-10-CM

## 2016-12-27 MED ORDER — LABETALOL HCL 200 MG PO TABS
400.0000 mg | ORAL_TABLET | Freq: Three times a day (TID) | ORAL | Status: DC
  Administered 2016-12-27: 400 mg via ORAL
  Filled 2016-12-27: qty 2

## 2016-12-27 MED ORDER — ISOSORBIDE MONONITRATE ER 30 MG PO TB24
90.0000 mg | ORAL_TABLET | Freq: Every day | ORAL | Status: DC
  Administered 2016-12-27: 90 mg via ORAL

## 2016-12-27 MED ORDER — ISOSORBIDE MONONITRATE ER 30 MG PO TB24: 90.0000 mg | ORAL_TABLET | Freq: Every day | ORAL | 0 refills | Status: DC

## 2016-12-27 MED ORDER — CLONIDINE HCL 0.2 MG PO TABS: 0.2000 mg | ORAL_TABLET | Freq: Three times a day (TID) | ORAL | 0 refills | Status: DC

## 2016-12-27 MED ORDER — HYDRALAZINE HCL 50 MG PO TABS
100.0000 mg | ORAL_TABLET | Freq: Three times a day (TID) | ORAL | Status: DC
  Administered 2016-12-27: 100 mg via ORAL

## 2016-12-27 MED ORDER — AMLODIPINE BESYLATE 10 MG PO TABS
10.0000 mg | ORAL_TABLET | Freq: Every day | ORAL | Status: DC
  Administered 2016-12-27: 10 mg via ORAL

## 2016-12-27 MED ORDER — CLONIDINE HCL 0.2 MG PO TABS
0.2000 mg | ORAL_TABLET | Freq: Three times a day (TID) | ORAL | Status: DC
  Administered 2016-12-27: 0.2 mg via ORAL

## 2016-12-27 NOTE — Progress Notes (Signed)
Discharge note. Patient educated at bedside. RN educated on medications and when to take them, changes in medications, follow up appointments, and when to call the MD.   Patient discharged by RN but refused to be wheeled out by nursing staff.

## 2016-12-27 NOTE — Discharge Summary (Signed)
Physician Discharge Summary  Jeremy Huerta ZOX:[id number] DOB: September 03, 1995 DOA: 12/24/2016  PCP: [name], MD  Admit date: 12/24/2016 Discharge date: 12/27/2016  Time spent: 35 minutes  Recommendations for Outpatient Follow-up:   Acute respiratory failure with hypoxia/Acute Pulmonary Edema -Pulmonary edema secondary to missing HD session  -Received emergent HD -Scheduled follow-up with Dr. [name]  on 8/29 at 1500 Essential hypertension, hyperkalemia, acute respiratory failure with hypoxia, noncompliance  ESRD on HD T/Th/Sat -HD today: Unfortunately did not control patients HTN -Continue home medication  Essential HTN -Upon admission BP 200/137 -Unfortunately today's HD did not control BP -Amlodipine 10 mg daily -Clonidine 0.2mg  TID - Imdur 90 mg -Labetalol 400 mg TID  Hyperkalemia -Resolved with HD   Discharge Diagnoses:  Active Problems:   Acute respiratory failure (HCC)   HCAP (healthcare-associated pneumonia)   ESRD (end stage renal disease) (HCC)   Hypertensive urgency   Hyperkalemia   Discharge Condition: Stable  Diet recommendation: Renal  Filed Weights   12/25/16 1249 12/26/16 1148 12/26/16 1438  Weight: 195 lb 15.8 oz (88.9 kg) 198 lb 10.2 oz (90.1 kg) 192 lb 14.4 oz (87.5 kg)    History of present illness:  21 y.o.BM PMHx ESRD on HD T/Th/Sat, FSG as, HTN, Noncompliant   Presents Acute respiratory failure and somnolence and unable to provide detailed history at this time. History obtained by consultation with you and from the patient.  Patient presenting with one day of acute worsening shortness of breath. Reports several day history of cough and URI type symptoms but denies actual fevers. Patient states he's had some chest discomfort with coughing but no true chest pain. Patient states he typically gets dialysis on Tuesday Thursday and Saturday and missed his dialysis session on Saturday. At time of my examination patient also  denies rash, abdominal pain, nausea, vomiting, flank pain, neck stiffness, headache, focal neurological deficit.  During his hospitalization patient was treated for acute respiratory failure with hypoxia secondary to missing HD session. Patient had emergent HD, with resolution of symptoms. In addition patient medications adjusted for his uncontrolled essential hypertension.    Procedures: None  Consultations: Nephrology   Cultures None  Antibiotics None   Discharge Exam: Vitals:   12/27/16 0325 12/27/16 0822 12/27/16 0827 12/27/16 0934  BP: (!) 141/90 (!) 164/115 (!) 163/99   Pulse:  81  98  Resp: 19 19 18    Temp: 98.2 F (36.8 C) 98.5 F (36.9 C)    TempSrc: Oral Oral    SpO2: 100% 98%    Weight:      Height:        General: A/O 4, negative acute respiratory distress Cardiovascular: Regular rhythm and rate, negative murmurs rubs or gallops, normal S1/S2 Respiratory: Clear to auscultation bilaterally, negative wheezes, negative crackles    Discharge Instructions   Allergies as of 12/27/2016   No Known Allergies     Medication List    STOP taking these medications   CATAPRES-TTS-1 0.1 mg/24hr patch Generic drug:  cloNIDine Replaced by:  cloNIDine 0.2 MG tablet     TAKE these medications   amLODipine 10 MG tablet Commonly known as:  NORVASC Take 10 mg by mouth daily.   B complex-vitamin C-folic acid 1 MG tablet Take 1 tablet by mouth daily.   cinacalcet 30 MG tablet Commonly known as:  SENSIPAR Take 2 tablets (60 mg total) by mouth daily with supper.   cloNIDine 0.2 MG tablet Commonly known as:  CATAPRES Take 1 tablet (0.2  mg total) by mouth 3 (three) times daily. Replaces:  CATAPRES-TTS-1 0.1 mg/24hr patch   hydrALAZINE 100 MG tablet Commonly known as:  APRESOLINE Take 100 mg by mouth 3 (three) times daily.   isosorbide mononitrate 30 MG 24 hr tablet Commonly known as:  IMDUR Take 3 tablets (90 mg total) by mouth daily. What  changed:  medication strength  how much to take   labetalol 200 MG tablet Commonly known as:  NORMODYNE Take 400 mg by mouth 3 (three) times daily.   loperamide 2 MG tablet Commonly known as:  IMODIUM A-D Take 1 tablet (2 mg total) by mouth 4 (four) times daily as needed for diarrhea or loose stools.   ondansetron 4 MG disintegrating tablet Commonly known as:  ZOFRAN ODT Take 1 tablet (4 mg total) by mouth every 8 (eight) hours as needed for nausea or vomiting.   sevelamer 800 MG tablet Commonly known as:  RENAGEL Take 2,400 mg by mouth 3 (three) times daily with meals.      No Known Allergies Follow-up Information    Faustino CongressFletcher, Alison Jo, MD. Go on 01/15/2017.   Specialty:  Nephrology Why:  Please follow up with Dr. Primitivo GauzeFletcher on Wednesday, August 29th, 2018 at 3:00pm for essential hypertension, hyperkalemia, acute respiratory failure with hypoxia and noncompliance. Office [facility]th Floor [facility] Contact information: [facility] [address] [place] [address] [phone]            The results of significant diagnostics from this hospitalization (including imaging, microbiology, ancillary and laboratory) are listed below for reference.    Significant Diagnostic Studies: Dg Chest 2 View  Result Date: 12/24/2016 Delano MetzSchertz, Robert, MD     12/24/2016  2:11 PM I was present at this dialysis session, have reviewed the session itself and made  appropriate changes Vinson Moselleob Schertz MD [facility] pager [phone]  12/24/2016, 2:11 PM   Dg Chest Port 1 View  Result Date: 12/24/2016 CLINICAL DATA:  Acute onset of shortness of breath. Initial encounter. EXAM: PORTABLE CHEST 1 VIEW COMPARISON:  Chest radiograph performed 11/19/2016 FINDINGS: Bibasilar airspace opacities, right greater than left, raise concern for multifocal pneumonia. Pulmonary edema could have a similar appearance. No pleural effusion or pneumothorax is seen. The cardiomediastinal  silhouette is borderline enlarged. No acute osseous abnormalities are seen. IMPRESSION: 1. Bibasilar airspace opacities, right greater than left, raise concern for multifocal pneumonia. Pulmonary edema could have a similar appearance. 2. Borderline cardiomegaly. Electronically Signed   By: Roanna RaiderJeffery  Chang M.D.   On: 12/24/2016 05:13    Microbiology: Recent Results (from the past 240 hour(s))  Culture, blood (routine x 2) Call MD if unable to obtain prior to antibiotics being given     Status: None (Preliminary result)   Collection Time: 12/24/16  8:51 AM  Result Value Ref Range Status   Specimen Description BLOOD RIGHT FOREARM  Final   Special Requests   Final    BOTTLES DRAWN AEROBIC ONLY Blood Culture adequate volume   Culture NO GROWTH 2 DAYS  Final   Report Status PENDING  Incomplete  Culture, blood (routine x 2) Call MD if unable to obtain prior to antibiotics being given     Status: None (Preliminary result)   Collection Time: 12/24/16  9:00 AM  Result Value Ref Range Status   Specimen Description BLOOD RIGHT HAND  Final   Special Requests   Final    BOTTLES DRAWN AEROBIC ONLY Blood Culture adequate volume  Culture NO GROWTH 2 DAYS  Final   Report Status PENDING  Incomplete  MRSA PCR Screening     Status: None   Collection Time: 12/24/16  7:00 PM  Result Value Ref Range Status   MRSA by PCR NEGATIVE NEGATIVE Final    Comment:        The GeneXpert MRSA Assay (FDA approved for NASAL specimens only), is one component of a comprehensive MRSA colonization surveillance program. It is not intended to diagnose MRSA infection nor to guide or monitor treatment for MRSA infections.   Respiratory Panel by PCR     Status: None   Collection Time: 12/25/16 12:28 AM  Result Value Ref Range Status   Adenovirus NOT DETECTED NOT DETECTED Final   Coronavirus 229E NOT DETECTED NOT DETECTED Final   Coronavirus HKU1 NOT DETECTED NOT DETECTED Final   Coronavirus NL63 NOT DETECTED NOT DETECTED  Final   Coronavirus OC43 NOT DETECTED NOT DETECTED Final   Metapneumovirus NOT DETECTED NOT DETECTED Final   Rhinovirus / Enterovirus NOT DETECTED NOT DETECTED Final   Influenza A NOT DETECTED NOT DETECTED Final   Influenza B NOT DETECTED NOT DETECTED Final   Parainfluenza Virus 1 NOT DETECTED NOT DETECTED Final   Parainfluenza Virus 2 NOT DETECTED NOT DETECTED Final   Parainfluenza Virus 3 NOT DETECTED NOT DETECTED Final   Parainfluenza Virus 4 NOT DETECTED NOT DETECTED Final   Respiratory Syncytial Virus NOT DETECTED NOT DETECTED Final   Bordetella pertussis NOT DETECTED NOT DETECTED Final   Chlamydophila pneumoniae NOT DETECTED NOT DETECTED Final   Mycoplasma pneumoniae NOT DETECTED NOT DETECTED Final     Labs: Basic Metabolic Panel:  Recent Labs Lab 12/24/16 0448 12/24/16 0523 12/24/16 0900 12/25/16 0316  NA 140 139  --  136  K 6.1* 6.0*  --  4.9  CL 104 107  --  98*  CO2 20*  --   --  24  GLUCOSE 89 88  --  127*  BUN 83* 77*  --  42*  CREATININE 21.80* >18.00*  --  14.07*  CALCIUM 8.7*  --   --  8.1*  MG  --   --  2.6*  --   PHOS  --   --  5.1*  --    Liver Function Tests:  Recent Labs Lab 12/24/16 0448 12/25/16 0316  AST 21 19  ALT 22 20  ALKPHOS 69 56  BILITOT 0.6 0.6  PROT 6.4* 5.8*  ALBUMIN 3.3* 2.8*    Recent Labs Lab 12/24/16 0448  LIPASE 40   No results for input(s): AMMONIA in the last 168 hours. CBC:  Recent Labs Lab 12/24/16 0448 12/24/16 0523 12/25/16 0316  WBC 9.7  --  6.9  NEUTROABS 7.2  --   --   HGB 9.6* 10.5* 9.2*  HCT 30.2* 31.0* 29.0*  MCV 86.8  --  86.8  PLT 364  --  328   Cardiac Enzymes: No results for input(s): CKTOTAL, CKMB, CKMBINDEX, TROPONINI in the last 168 hours. BNP: BNP (last 3 results) No results for input(s): BNP in the last 8760 hours.  ProBNP (last 3 results) No results for input(s): PROBNP in the last 8760 hours.  CBG: No results for input(s): GLUCAP in the last 168  hours.     Signed:  Carolyne Littles, MD [facility] [phone] pager

## 2016-12-29 LAB — CULTURE, BLOOD (ROUTINE X 2)
Culture: NO GROWTH
Culture: NO GROWTH
SPECIAL REQUESTS: ADEQUATE
Special Requests: ADEQUATE

## 2017-01-13 ENCOUNTER — Encounter (HOSPITAL_COMMUNITY): Payer: Self-pay

## 2017-01-13 ENCOUNTER — Inpatient Hospital Stay (HOSPITAL_COMMUNITY)
Admission: EM | Admit: 2017-01-13 | Discharge: 2017-01-15 | DRG: 304 | Disposition: A | Discharge status: Home / Self Care | Payer: Medicaid Other | Attending: Internal Medicine | Admitting: Internal Medicine

## 2017-01-13 ENCOUNTER — Emergency Department (HOSPITAL_COMMUNITY): Payer: Medicaid Other

## 2017-01-13 DIAGNOSIS — R0602 Shortness of breath: Secondary | ICD-10-CM

## 2017-01-13 DIAGNOSIS — R040 Epistaxis: Secondary | ICD-10-CM | POA: Diagnosis present

## 2017-01-13 DIAGNOSIS — N2581 Secondary hyperparathyroidism of renal origin: Secondary | ICD-10-CM | POA: Diagnosis present

## 2017-01-13 DIAGNOSIS — I120 Hypertensive chronic kidney disease with stage 5 chronic kidney disease or end stage renal disease: Secondary | ICD-10-CM | POA: Diagnosis present

## 2017-01-13 DIAGNOSIS — Z9119 Patient's noncompliance with other medical treatment and regimen: Secondary | ICD-10-CM

## 2017-01-13 DIAGNOSIS — J811 Chronic pulmonary edema: Secondary | ICD-10-CM | POA: Diagnosis present

## 2017-01-13 DIAGNOSIS — Z862 Personal history of diseases of the blood and blood-forming organs and certain disorders involving the immune mechanism: Secondary | ICD-10-CM | POA: Diagnosis not present

## 2017-01-13 DIAGNOSIS — Z9114 Patient's other noncompliance with medication regimen: Secondary | ICD-10-CM

## 2017-01-13 DIAGNOSIS — N269 Renal sclerosis, unspecified: Secondary | ICD-10-CM | POA: Diagnosis present

## 2017-01-13 DIAGNOSIS — N186 End stage renal disease: Secondary | ICD-10-CM | POA: Diagnosis present

## 2017-01-13 DIAGNOSIS — Z79899 Other long term (current) drug therapy: Secondary | ICD-10-CM

## 2017-01-13 DIAGNOSIS — R071 Chest pain on breathing: Secondary | ICD-10-CM

## 2017-01-13 DIAGNOSIS — D631 Anemia in chronic kidney disease: Secondary | ICD-10-CM | POA: Diagnosis present

## 2017-01-13 DIAGNOSIS — K219 Gastro-esophageal reflux disease without esophagitis: Secondary | ICD-10-CM | POA: Diagnosis present

## 2017-01-13 DIAGNOSIS — Z992 Dependence on renal dialysis: Secondary | ICD-10-CM | POA: Diagnosis not present

## 2017-01-13 DIAGNOSIS — Z9115 Patient's noncompliance with renal dialysis: Secondary | ICD-10-CM | POA: Diagnosis not present

## 2017-01-13 DIAGNOSIS — I161 Hypertensive emergency: Secondary | ICD-10-CM | POA: Diagnosis present

## 2017-01-13 DIAGNOSIS — R509 Fever, unspecified: Secondary | ICD-10-CM | POA: Diagnosis present

## 2017-01-13 DIAGNOSIS — I160 Hypertensive urgency: Secondary | ICD-10-CM

## 2017-01-13 DIAGNOSIS — N189 Chronic kidney disease, unspecified: Secondary | ICD-10-CM

## 2017-01-13 LAB — HEPATIC FUNCTION PANEL
ALBUMIN: 3.8 g/dL (ref 3.5–5.0)
ALT: 13 U/L — AB (ref 17–63)
AST: 18 U/L (ref 15–41)
Alkaline Phosphatase: 66 U/L (ref 38–126)
BILIRUBIN INDIRECT: 0.9 mg/dL (ref 0.3–0.9)
Bilirubin, Direct: 0.1 mg/dL (ref 0.1–0.5)
TOTAL PROTEIN: 7.3 g/dL (ref 6.5–8.1)
Total Bilirubin: 1 mg/dL (ref 0.3–1.2)

## 2017-01-13 LAB — BASIC METABOLIC PANEL
ANION GAP: 17 — AB (ref 5–15)
BUN: 69 mg/dL — ABNORMAL HIGH (ref 6–20)
CALCIUM: 8.5 mg/dL — AB (ref 8.9–10.3)
CO2: 22 mmol/L (ref 22–32)
Chloride: 103 mmol/L (ref 101–111)
Creatinine, Ser: 19.78 mg/dL — ABNORMAL HIGH (ref 0.61–1.24)
GFR, EST AFRICAN AMERICAN: 3 mL/min — AB (ref >60)
GFR, EST NON AFRICAN AMERICAN: 3 mL/min — AB (ref >60)
Glucose, Bld: 90 mg/dL (ref 65–99)
POTASSIUM: 4.4 mmol/L (ref 3.5–5.1)
Sodium: 142 mmol/L (ref 135–145)

## 2017-01-13 LAB — I-STAT CHEM 8, ED
BUN: 73 mg/dL — AB (ref 6–20)
CHLORIDE: 103 mmol/L (ref 101–111)
Calcium, Ion: 0.94 mmol/L — ABNORMAL LOW (ref 1.15–1.40)
Creatinine, Ser: >18 mg/dL — ABNORMAL HIGH (ref 0.61–1.24)
Glucose, Bld: 84 mg/dL (ref 65–99)
HEMATOCRIT: 32 % — AB (ref 39.0–52.0)
Hemoglobin: 10.9 g/dL — ABNORMAL LOW (ref 13.0–17.0)
Potassium: 4.8 mmol/L (ref 3.5–5.1)
SODIUM: 139 mmol/L (ref 135–145)
TCO2: 24 mmol/L (ref 22–32)

## 2017-01-13 LAB — CBC
HEMATOCRIT: 33.9 % — AB (ref 39.0–52.0)
HEMOGLOBIN: 10.5 g/dL — AB (ref 13.0–17.0)
MCH: 26.9 pg (ref 26.0–34.0)
MCHC: 31 g/dL (ref 30.0–36.0)
MCV: 86.9 fL (ref 78.0–100.0)
Platelets: 279 10*3/uL (ref 150–400)
RBC: 3.9 MIL/uL — AB (ref 4.22–5.81)
RDW: 15.5 % (ref 11.5–15.5)
WBC: 11 10*3/uL — AB (ref 4.0–10.5)

## 2017-01-13 LAB — I-STAT TROPONIN, ED: Troponin i, poc: 0.07 ng/mL (ref 0.00–0.08)

## 2017-01-13 LAB — BRAIN NATRIURETIC PEPTIDE: B NATRIURETIC PEPTIDE 5: 2858.3 pg/mL — AB (ref 0.0–100.0)

## 2017-01-13 MED ORDER — NITROGLYCERIN 0.4 MG SL SUBL
0.4000 mg | SUBLINGUAL_TABLET | SUBLINGUAL | Status: DC | PRN
  Administered 2017-01-13: 0.4 mg via SUBLINGUAL
  Filled 2017-01-13: qty 1

## 2017-01-13 MED ORDER — HEPARIN SODIUM (PORCINE) 1000 UNIT/ML IJ SOLN: 4600.0000 [IU] | Freq: Once | INTRAMUSCULAR | Status: DC

## 2017-01-13 MED ORDER — SODIUM CHLORIDE 0.9 % IV SOLN: 250.0000 mL | INTRAVENOUS | Status: DC | PRN

## 2017-01-13 MED ORDER — DOXERCALCIFEROL 4 MCG/2ML IV SOLN
7.0000 ug | Freq: Once | INTRAVENOUS | Status: AC
  Administered 2017-01-13: 7 ug via INTRAVENOUS

## 2017-01-13 MED ORDER — LABETALOL HCL 5 MG/ML IV SOLN
10.0000 mg | Freq: Once | INTRAVENOUS | Status: AC
  Administered 2017-01-14: 10 mg via INTRAVENOUS
  Filled 2017-01-13: qty 4

## 2017-01-13 MED ORDER — CLONIDINE HCL 0.2 MG PO TABS
0.2000 mg | ORAL_TABLET | Freq: Three times a day (TID) | ORAL | Status: DC
  Administered 2017-01-14 – 2017-01-15 (×5): 0.2 mg via ORAL
  Filled 2017-01-13 (×5): qty 1

## 2017-01-13 MED ORDER — HYDRALAZINE HCL 50 MG PO TABS
100.0000 mg | ORAL_TABLET | Freq: Three times a day (TID) | ORAL | Status: DC
  Administered 2017-01-14 – 2017-01-15 (×4): 100 mg via ORAL
  Filled 2017-01-13 (×5): qty 2

## 2017-01-13 MED ORDER — LIDOCAINE HCL (PF) 1 % IJ SOLN: 5.0000 mL | INTRAMUSCULAR | Status: DC | PRN

## 2017-01-13 MED ORDER — AMLODIPINE BESYLATE 10 MG PO TABS
10.0000 mg | ORAL_TABLET | Freq: Every day | ORAL | Status: DC
  Administered 2017-01-13 – 2017-01-15 (×3): 10 mg via ORAL
  Filled 2017-01-13 (×2): qty 1
  Filled 2017-01-13: qty 2
  Filled 2017-01-13: qty 1

## 2017-01-13 MED ORDER — ONDANSETRON HCL 4 MG/2ML IJ SOLN: 4.0000 mg | Freq: Four times a day (QID) | INTRAMUSCULAR | Status: DC | PRN

## 2017-01-13 MED ORDER — HEPARIN SODIUM (PORCINE) 1000 UNIT/ML DIALYSIS
1000.0000 [IU] | INTRAMUSCULAR | Status: DC | PRN
  Filled 2017-01-13: qty 1

## 2017-01-13 MED ORDER — PENTAFLUOROPROP-TETRAFLUOROETH EX AERO: 1.0000 "application " | INHALATION_SPRAY | CUTANEOUS | Status: DC | PRN

## 2017-01-13 MED ORDER — LABETALOL HCL 5 MG/ML IV SOLN: 20.0000 mg | INTRAVENOUS | Status: DC | PRN

## 2017-01-13 MED ORDER — LABETALOL HCL 200 MG PO TABS
400.0000 mg | ORAL_TABLET | Freq: Three times a day (TID) | ORAL | Status: DC
  Administered 2017-01-13 – 2017-01-15 (×6): 400 mg via ORAL
  Filled 2017-01-13 (×6): qty 2

## 2017-01-13 MED ORDER — SEVELAMER CARBONATE 800 MG PO TABS
2400.0000 mg | ORAL_TABLET | Freq: Three times a day (TID) | ORAL | Status: DC
  Administered 2017-01-14 – 2017-01-15 (×5): 2400 mg via ORAL
  Filled 2017-01-13 (×5): qty 3

## 2017-01-13 MED ORDER — SODIUM CHLORIDE 0.9% FLUSH: 3.0000 mL | INTRAVENOUS | Status: DC | PRN

## 2017-01-13 MED ORDER — MORPHINE SULFATE (PF) 4 MG/ML IV SOLN: 2.0000 mg | INTRAVENOUS | Status: DC | PRN

## 2017-01-13 MED ORDER — ONDANSETRON HCL 4 MG PO TABS: 4.0000 mg | ORAL_TABLET | Freq: Four times a day (QID) | ORAL | Status: DC | PRN

## 2017-01-13 MED ORDER — PANTOPRAZOLE SODIUM 40 MG PO TBEC
40.0000 mg | DELAYED_RELEASE_TABLET | Freq: Every day | ORAL | Status: DC
  Administered 2017-01-14 – 2017-01-15 (×3): 40 mg via ORAL
  Filled 2017-01-13 (×3): qty 1

## 2017-01-13 MED ORDER — ASPIRIN EC 81 MG PO TBEC
81.0000 mg | DELAYED_RELEASE_TABLET | Freq: Every day | ORAL | Status: DC
  Administered 2017-01-14 – 2017-01-15 (×3): 81 mg via ORAL
  Filled 2017-01-13 (×3): qty 1

## 2017-01-13 MED ORDER — SODIUM CHLORIDE 0.9 % IV SOLN: 100.0000 mL | INTRAVENOUS | Status: DC | PRN

## 2017-01-13 MED ORDER — ACETAMINOPHEN 325 MG PO TABS
650.0000 mg | ORAL_TABLET | Freq: Four times a day (QID) | ORAL | Status: DC | PRN
  Administered 2017-01-13: 650 mg via ORAL

## 2017-01-13 MED ORDER — HEPARIN SODIUM (PORCINE) 1000 UNIT/ML DIALYSIS
4600.0000 [IU]/h | Freq: Once | INTRAMUSCULAR | Status: AC
  Administered 2017-01-13: 4600 [IU]/h via INTRAVENOUS_CENTRAL

## 2017-01-13 MED ORDER — ACETAMINOPHEN 325 MG PO TABS
ORAL_TABLET | ORAL | Status: AC
  Administered 2017-01-13: 650 mg via ORAL
  Filled 2017-01-13: qty 2

## 2017-01-13 MED ORDER — ALTEPLASE 2 MG IJ SOLR: 2.0000 mg | Freq: Once | INTRAMUSCULAR | Status: DC | PRN

## 2017-01-13 MED ORDER — CINACALCET HCL 30 MG PO TABS
60.0000 mg | ORAL_TABLET | Freq: Every day | ORAL | Status: DC
  Administered 2017-01-14: 60 mg via ORAL
  Filled 2017-01-13 (×2): qty 2

## 2017-01-13 MED ORDER — LIDOCAINE-PRILOCAINE 2.5-2.5 % EX CREA: 1.0000 "application " | TOPICAL_CREAM | CUTANEOUS | Status: DC | PRN

## 2017-01-13 MED ORDER — ISOSORBIDE MONONITRATE ER 60 MG PO TB24
90.0000 mg | ORAL_TABLET | Freq: Every day | ORAL | Status: DC
  Administered 2017-01-14 – 2017-01-15 (×2): 90 mg via ORAL
  Filled 2017-01-13 (×2): qty 1

## 2017-01-13 MED ORDER — DOXERCALCIFEROL 4 MCG/2ML IV SOLN
7.0000 ug | INTRAVENOUS | Status: DC
  Administered 2017-01-14: 7 ug via INTRAVENOUS

## 2017-01-13 MED ORDER — DOXERCALCIFEROL 4 MCG/2ML IV SOLN
INTRAVENOUS | Status: AC
  Administered 2017-01-13: 7 ug via INTRAVENOUS
  Filled 2017-01-13: qty 4

## 2017-01-13 MED ORDER — SODIUM CHLORIDE 0.9% FLUSH: 3.0000 mL | Freq: Two times a day (BID) | INTRAVENOUS | Status: DC

## 2017-01-13 MED ORDER — HEPARIN SODIUM (PORCINE) 5000 UNIT/ML IJ SOLN
5000.0000 [IU] | Freq: Three times a day (TID) | INTRAMUSCULAR | Status: DC
  Filled 2017-01-13 (×2): qty 1

## 2017-01-13 MED ORDER — SODIUM CHLORIDE 0.9% FLUSH
3.0000 mL | Freq: Two times a day (BID) | INTRAVENOUS | Status: DC
  Administered 2017-01-14 – 2017-01-15 (×4): 3 mL via INTRAVENOUS

## 2017-01-13 NOTE — H&P (Signed)
History and Physical    Orvel Macioce VFM:[id number] DOB: 10/17/1995 DOA: 01/13/2017  PCP: [name], MD   I have briefly reviewed patients previous medical reports in [facility].  Patient coming from: home  Chief Complaint: difficulty breathing, HA, nosebleed and pleuritic chest discomfort   HPI: Jeremy Huerta is a 21 Y/O with FSGS ESRD, HTN, secondary hyperparathyroidism, AOCD and GERD; who presented to ED with SOB, pleuritic CP and nosebleed. Patient also reported intermittent HA's. Symptoms has been present and worsening since Saturday evening. Patient missed his HD treatment on Saturday (8/25) and also shortened HD treatment on Thursday (8/23). He had a recent admission with hypertensive emergency and pulmonary edema due to same medication/treatment non-compliance. Patient also expressed being w/o 2 of his antihypertensive meds for the last 3 days or so. Patient CP is associated with coughing or deep breaths (pleuritic); intermittent; self terminated and no associated with nausea or diaphoresis. Patient denies fever, hemoptysis, blurred vision, melena, hematochezia, abd pain, dysuria or hematuria ( reports intermittent episodes of passing urine.  ED Course: in ED found with BP of 212/147; patient received amlodipine and home labetalol PO ordered (not given yet by time of my evaluation), one dose of NTG and had CXR done that demonstrated vascular congestion. O2 oxygen initiated and TRH/nephrology consulted for further treatment.   Review of Systems:  All other systems reviewed and apart from HPI, are negative.  Past Medical History:  Diagnosis Date  . Acute respiratory failure (HCC)   . ESRD (end stage renal disease) (HCC)   . FSGS (focal segmental glomerulosclerosis)   . Hypertension   . Nausea & vomiting 11/2016  . Renal disorder    on dialysis    Past Surgical History:  Procedure Laterality Date  . AV FISTULA PLACEMENT  12/2015  . RENAL BIOPSY  2013     Social History  reports that he has never smoked. He has never used smokeless tobacco. He reports that he does not drink alcohol or use drugs.  No Known Allergies  Family History  Problem Relation Age of Onset  . Hypertension Mother   . Diabetes Father   . Hypertension Father   . Kidney disease Father   . Hypertension Maternal Grandmother   . Diabetes Paternal Grandmother   . Hypertension Paternal Grandmother      Prior to Admission medications   Medication Sig Start Date End Date Taking? Authorizing Provider  amLODipine (NORVASC) 10 MG tablet Take 10 mg by mouth daily.  07/15/14   [provider]  B Complex-C-Folic Acid (B COMPLEX-VITAMIN C-FOLIC ACID) 1 MG tablet Take 1 tablet by mouth daily. 04/30/16   [provider]  cinacalcet (SENSIPAR) 30 MG tablet Take 2 tablets (60 mg total) by mouth daily with supper. 12/04/16   Rolly Salter, MD  cloNIDine (CATAPRES) 0.2 MG tablet Take 1 tablet (0.2 mg total) by mouth 3 (three) times daily. 12/27/16   Drema Dallas, MD  hydrALAZINE (APRESOLINE) 100 MG tablet Take 100 mg by mouth 3 (three) times daily.  04/13/16   [provider]  isosorbide mononitrate (IMDUR) 30 MG 24 hr tablet Take 3 tablets (90 mg total) by mouth daily. 12/27/16   Drema Dallas, MD  labetalol (NORMODYNE) 200 MG tablet Take 400 mg by mouth 3 (three) times daily. 03/16/16   [provider]  loperamide (IMODIUM A-D) 2 MG tablet Take 1 tablet (2 mg total) by mouth 4 (four) times daily as needed for diarrhea or loose  stools. 02/13/16   Vanetta Mulders, MD  ondansetron (ZOFRAN ODT) 4 MG disintegrating tablet Take 1 tablet (4 mg total) by mouth every 8 (eight) hours as needed for nausea or vomiting. 12/02/16   Ward, Chase Picket, PA-C  sevelamer (RENAGEL) 800 MG tablet Take 2,400 mg by mouth 3 (three) times daily with meals.     [provider]    Physical Exam: Vitals:   01/13/17 1630 01/13/17 1700 01/13/17 1736 01/13/17  1745  BP: (!) 214/147 (!) 199/137 (!) 181/118 (!) 180/116  Pulse: 95 94 97 97  Resp: 19 20 (!) 40 (!) 52  Temp:      SpO2: 91% 92% 93% 98%    Constitutional: low grade temp, slight CP with deep breathing and coughing, no nausea, no vomiting. Patient in no distress, answering questions appropriately and with good insight. Eyes: PERTLA, lids and conjunctivae normal, no icterus ENMT: Mucous membranes are moist. Posterior pharynx clear of any exudate or lesions. normal dentition. No thrush  Neck: supple, no masses, no thyromegaly Respiratory: no wheezing; positive fine crackles bibasilar; normal resp effort Cardiovascular: S1 & S2 heard, regular rate and rhythm, no murmurs / rubs / gallops. No extremity edema. 2+ pedal pulses. No JVD appreciated. Abdomen: No distension, no tenderness, no masses palpated. No hepatosplenomegaly. Bowel sounds normal.  Musculoskeletal: no clubbing / cyanosis. No joint deformity upper and lower extremities. Good ROM, no contractures. Normal muscle tone. Patient Left forearm AVF with good bruit; no pain and no erythema Skin: no rashes, lesions, ulcers. No induration appreciated Neurologic: CN 2-12 grossly intact. Sensation intact, DTR normal. Strength 5/5 in all 4 limbs.  Psychiatric: Normal judgment and insight. Alert and oriented x 3. Normal mood.    Labs on Admission: I have personally reviewed following labs and imaging studies  CBC:  Recent Labs Lab 01/13/17 1532 01/13/17 1711  WBC 11.0*  --   HGB 10.5* 10.9*  HCT 33.9* 32.0*  MCV 86.9  --   PLT 279  --    Basic Metabolic Panel:  Recent Labs Lab 01/13/17 1532 01/13/17 1711  NA 142 139  K 4.4 4.8  CL 103 103  CO2 22  --   GLUCOSE 90 84  BUN 69* 73*  CREATININE 19.78* >18.00*  CALCIUM 8.5*  --    Liver Function Tests:  Recent Labs Lab 01/13/17 1547  AST 18  ALT 13*  ALKPHOS 66  BILITOT 1.0  PROT 7.3  ALBUMIN 3.8   Urine analysis:    Component Value Date/Time   COLORURINE  YELLOW 07/20/2014 0114   APPEARANCEUR CLOUDY (A) 07/20/2014 0114   LABSPEC 1.013 07/20/2014 0114   PHURINE 6.0 07/20/2014 0114   GLUCOSEU NEGATIVE 07/20/2014 0114   HGBUR SMALL (A) 07/20/2014 0114   BILIRUBINUR NEGATIVE 07/20/2014 0114   KETONESUR NEGATIVE 07/20/2014 0114   PROTEINUR >300 (A) 07/20/2014 0114   UROBILINOGEN 0.2 07/20/2014 0114   NITRITE NEGATIVE 07/20/2014 0114   LEUKOCYTESUR NEGATIVE 07/20/2014 0114    Radiological Exams on Admission: Dg Chest Portable 1 View  Result Date: 01/13/2017 CLINICAL DATA:  Pulmonary edema suspected in HD patient Hypertension with nosebleed. Upper mid to left chest pain began today. Productive cough began today with blood tinged phlegm EXAM: PORTABLE CHEST 1 VIEW COMPARISON:  12/24/2016 FINDINGS: Stable large cardiac silhouette there is bilateral perihilar airspace disease. Small effusions. No pneumothorax. No focal infiltrate. IMPRESSION: Cardiomegaly and central pulmonary edema. Electronically Signed   By: Genevive Bi M.D.   On: 01/13/2017 16:33  EKG:  No acute ischemic changes; sinus rhythm.  Assessment/Plan 1-Hypertensive emergency: with SOB(vascular congestion and pulmonary edema seen on CXR), HA's and nose bleed. -patient with hx of ESRD due to FSGS -reported missing HD on Saturday (8/25) and also shortened his treatment on Thursday (8/23) -patient also ran out of 2 of his BP medications for 3 days or so (?? Medication compliance) -and most likely diet indiscretion  -will give labetalol IV, performed emergency HD and resume home antihypertensive regimen  -will follow VS and adjust regimen as needed  -renal diet ordered   2-chest pain: most likely from hypertensive emergency and vascular congestion (patient reported pleuritic pain) -will monitor on telemetry -cycle troponin -no ischemic changes on EKG -PRN pain meds -patient also reported some nausea and burning component; most likely had some GERD; will use PPI  3-ESRD  (end stage renal disease) (HCC) -appreciated renal service assistance and inputs -HD today and most likely again tomorrow to get him back on schedule -will follow rec's  4-History of anemia due to chronic kidney disease -with some component of hemodilution due to retained extra fluid -will follow Hgb trend after HD -except for small transient nose bleed, no signs of ongoing bleeding now -iron and aranesp per renal discretion   5-Secondary hyperparathyroidism (HCC) -will continue renvela and sensipar -follow phosphorus and Ca level  6-Bleeding from the nose -small and transient -most likely associated with hypertensive emergency  -will ocntrol BP -no further bleeding appreciated -will monitor  7-Fever -unclear source at this time -no frank infiltrates on CXR (but could be obscure by congestion) -will check UA/urine cx, blood cx X 2 and repeat CXR 2 views in am -PRN antipyretics and follow clinical response -patient non-septic on presentation  -will monitor off abx's for now  8-GERD -as mentioned above will give PPI    Time: 70 minutes   DVT prophylaxis: heparin   Code Status: Full Family Communication: no family at bedside   Disposition Plan: anticipate discharge back home once medical problems controlled  Consults called: nephrology (Dr. Eliott Nine) Admission status: inpatient, telemetry, LOS > 2 midnights     Vassie Loll MD [facility] Pager [phone]  If 7PM-7AM, please contact night-coverage [email or web address] Password [email or web address]  01/13/2017, 7:09 PM

## 2017-01-13 NOTE — Procedures (Signed)
I have personally attended this patient's dialysis session.   Pre HD weight 91.4 Goal 4.9 kg (for lower EDW to 86.5 kg) BP 175/101 L AVF 400 no issues  Camille Bal, MD [facility] [phone] Pager 01/13/2017, 8:57 PM

## 2017-01-13 NOTE — ED Notes (Signed)
Patient updated on Plan of Care.  States no chest pain at this time, just feels lethargic and worn out. Pending consult to nephrology.

## 2017-01-13 NOTE — ED Provider Notes (Addendum)
  I have personally seen and examined the patient. I have reviewed the documentation on PMH/FH/Soc Hx. I have discussed the plan of care with the resident and patient.  I have reviewed and agree with the resident's documentation. Please see associated encounter note.  Patient with a history of FSGS resulting in ESRD on dialysis Tuesday, Thursday, Saturday who presents to the emergency department with chest pain and shortness of breath. Patient is hypertensive, found to have pulmonary edema on exam. Is concerning for hypertensive emergency. EKG without acute ischemic changes or evidence of pericarditis. Initial troponin negative. Discussed case with nephrology who will emergently dialyze the patient. Will be admitted to medicine for continued management of his hypertension.   EKG Interpretation  Date/Time:  Monday January 13 2017 15:16:27 EDT Ventricular Rate:  96 PR Interval:    QRS Duration: 75 QT Interval:  385 QTC Calculation: 487 R Axis:   71 Text Interpretation:  Sinus rhythm Abnormal T, consider ischemia, lateral leads NO STEMI No significant change since last tracing Confirmed by Drema Pry [phone]) on 01/13/2017 3:53:52 PM        CRITICAL CARE Performed by: Amadeo Garnet Bracken Moffa Total critical care time: 30 minutes Critical care time was exclusive of separately billable procedures and treating other patients. Critical care was necessary to treat or prevent imminent or life-threatening deterioration. Critical care was time spent personally by me on the following activities: development of treatment plan with patient and/or surrogate as well as nursing, discussions with consultants, evaluation of patient's response to treatment, examination of patient, obtaining history from patient or surrogate, ordering and performing treatments and interventions, ordering and review of laboratory studies, ordering and review of radiographic studies, pulse oximetry and re-evaluation of patient's  condition.     Nira Conn, MD 01/13/17 [facility] [place]

## 2017-01-13 NOTE — ED Notes (Signed)
Report given to dialysis RN.  Transporting patient on Tele at this time

## 2017-01-13 NOTE — ED Provider Notes (Signed)
[facility]-EMERGENCY DEPT Provider Note   CSN: [id number] Arrival date & time: 01/13/17  1504  History   Chief Complaint Chief Complaint  Patient presents with  . Hypertension    HPI Jeremy Huerta is a 21 y.o. male.  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    The history is provided by the patient and medical records.  Shortness of Breath  This is a recurrent problem. The average episode lasts 2 days. The problem occurs frequently.The current episode started 2 days ago. The problem has not changed since onset.Associated symptoms include cough, PND, chest pain and leg swelling. Pertinent negatives include no fever, no rhinorrhea, no sore throat, no ear pain, no sputum production, no hemoptysis, no vomiting, no abdominal pain, no rash and no leg pain. He has had prior hospitalizations. He has had prior ED visits. Associated medical issues do not include COPD, CAD, past MI or DVT.    Past Medical History:  Diagnosis Date  . Acute respiratory failure (HCC)   . ESRD (end stage renal disease) (HCC)   . FSGS (focal segmental glomerulosclerosis)   . Hypertension   . Nausea & vomiting 11/2016  . Renal disorder    on dialysis    Patient Active Problem List   Diagnosis Date Noted  . Hypertensive emergency 01/13/2017  . History of anemia due to chronic kidney disease 01/13/2017  . Secondary hyperparathyroidism (HCC) 01/13/2017  . Bleeding from the nose 01/13/2017  . Fever 01/13/2017  . Chest pain on breathing   . SOB (shortness of breath)   . Gastroesophageal reflux disease   . Respiratory failure (HCC) 12/24/2016  . Acute pulmonary edema (HCC)   . End-stage renal disease on hemodialysis (HCC)   . Hypertensive urgency 12/02/2016  . Leukocytosis 12/02/2016  . Nausea & vomiting 12/02/2016  . Hyperkalemia 12/02/2016  . Acute respiratory failure (HCC) 04/22/2016   . Diarrhea 04/22/2016  . HCAP (healthcare-associated pneumonia) 04/22/2016  . Tachycardia 04/22/2016  . Renal disorder   . Hypertension   . ESRD (end stage renal disease) [facility])     Past Surgical History:  Procedure Laterality Date  . AV FISTULA PLACEMENT  12/2015  . RENAL BIOPSY  2013       Home Medications    Prior to Admission medications   Medication Sig Start Date End Date Taking? Authorizing Provider  amLODipine (NORVASC) 10 MG tablet Take 10 mg by mouth daily.  07/15/14   [provider]  B Complex-C-Folic Acid (B COMPLEX-VITAMIN C-FOLIC ACID) 1 MG tablet Take 1 tablet by mouth daily. 04/30/16   [provider]  cinacalcet (SENSIPAR) 30 MG tablet Take 2 tablets (60 mg total) by mouth daily with supper. 12/04/16   Rolly Salter, MD  cloNIDine (CATAPRES) 0.2 MG tablet Take 1 tablet (0.2 mg total) by mouth 3 (three) times daily. 12/27/16   Drema Dallas, MD  hydrALAZINE (APRESOLINE) 100 MG tablet Take 100 mg by mouth 3 (three) times daily.  04/13/16   [provider]  isosorbide mononitrate (IMDUR) 30 MG 24 hr tablet Take 3 tablets (90 mg total) by mouth daily. 12/27/16   Drema Dallas, MD  labetalol (NORMODYNE) 200 MG tablet Take 400 mg by mouth 3 (three) times daily. 03/16/16   [provider]  loperamide (IMODIUM A-D) 2 MG tablet Take 1 tablet (2 mg total) by  mouth 4 (four) times daily as needed for diarrhea or loose stools. 02/13/16   Vanetta Mulders, MD  ondansetron (ZOFRAN ODT) 4 MG disintegrating tablet Take 1 tablet (4 mg total) by mouth every 8 (eight) hours as needed for nausea or vomiting. 12/02/16   Ward, Chase Picket, PA-C  sevelamer (RENAGEL) 800 MG tablet Take 2,400 mg by mouth 3 (three) times daily with meals.     [provider]    Family History Family History  Problem Relation Age of Onset  . Hypertension Mother   . Diabetes Father   . Hypertension Father   . Kidney disease Father   . Hypertension Maternal  Grandmother   . Diabetes Paternal Grandmother   . Hypertension Paternal Grandmother     Social History Social History  Substance Use Topics  . Smoking status: Never Smoker  . Smokeless tobacco: Never Used  . Alcohol use No     Allergies   Patient has no known allergies.   Review of Systems Review of Systems  Constitutional: Negative for chills and fever.  HENT: Positive for nosebleeds. Negative for ear pain, rhinorrhea and sore throat.   Eyes: Negative for pain and visual disturbance.  Respiratory: Positive for cough, chest tightness and shortness of breath. Negative for hemoptysis and sputum production.   Cardiovascular: Positive for chest pain, leg swelling and PND. Negative for palpitations.  Gastrointestinal: Negative for abdominal pain, blood in stool, diarrhea and vomiting.  Genitourinary: Negative for dysuria and hematuria.  Musculoskeletal: Negative for arthralgias, back pain, gait problem and joint swelling.  Skin: Negative for color change and rash.  Neurological: Negative for seizures and syncope.  All other systems reviewed and are negative.  Physical Exam Updated Vital Signs BP (!) 178/120 (BP Location: Right Arm) Comment: RN notified  Pulse 90   Temp 99.5 F (37.5 C) (Oral)   Resp (!) 26   Ht 6' (1.829 m)   Wt 86.5 kg (190 lb 11.2 oz) Comment: standing  SpO2 98%   BMI 25.86 kg/m   Physical Exam  Constitutional: He appears well-developed and well-nourished.  HENT:  Head: Normocephalic and atraumatic.  Nose: Epistaxis is observed.  Controlled epistaxis, right nare with tissue.  Eyes: Pupils are equal, round, and reactive to light. Conjunctivae are normal.  Neck: Neck supple.  Cardiovascular: Normal rate and regular rhythm.   No murmur heard. Pulmonary/Chest: Effort normal. No respiratory distress. He has rales in the right middle field and the left middle field.  Abdominal: Soft. There is no tenderness.  Musculoskeletal: He exhibits no edema.        Right ankle: He exhibits swelling.       Left ankle: He exhibits swelling.  Neurological: He is alert.  Skin: Skin is warm and dry. He is not diaphoretic.  Psychiatric: He has a normal mood and affect.  Nursing note and vitals reviewed.  ED Treatments / Results  Labs (all labs ordered are listed, but only abnormal results are displayed) Labs Reviewed  BASIC METABOLIC PANEL - Abnormal; Notable for the following:       Result Value   BUN 69 (*)    Creatinine, Ser 19.78 (*)    Calcium 8.5 (*)    GFR calc non Af Amer 3 (*)    GFR calc Af Amer 3 (*)    Anion gap 17 (*)    All other components within normal limits  CBC - Abnormal; Notable for the following:    WBC 11.0 (*)  RBC 3.90 (*)    Hemoglobin 10.5 (*)    HCT 33.9 (*)    All other components within normal limits  BRAIN NATRIURETIC PEPTIDE - Abnormal; Notable for the following:    B Natriuretic Peptide 2,858.3 (*)    All other components within normal limits  HEPATIC FUNCTION PANEL - Abnormal; Notable for the following:    ALT 13 (*)    All other components within normal limits  I-STAT CHEM 8, ED - Abnormal; Notable for the following:    BUN 73 (*)    Creatinine, Ser >18.00 (*)    Calcium, Ion 0.94 (*)    Hemoglobin 10.9 (*)    HCT 32.0 (*)    All other components within normal limits  CULTURE, BLOOD (ROUTINE X 2)  CULTURE, BLOOD (ROUTINE X 2)  URINE CULTURE  URINALYSIS, ROUTINE W REFLEX MICROSCOPIC  MAGNESIUM  PHOSPHORUS  TROPONIN I  BASIC METABOLIC PANEL  CBC  TROPONIN I  TROPONIN I  I-STAT TROPONIN, ED    EKG  EKG Interpretation  Date/Time:  Monday January 13 2017 15:16:27 EDT Ventricular Rate:  96 PR Interval:    QRS Duration: 75 QT Interval:  385 QTC Calculation: 487 R Axis:   71 Text Interpretation:  Sinus rhythm Abnormal T, consider ischemia, lateral leads NO STEMI No significant change since last tracing Confirmed by Drema Pry [phone]) on 01/13/2017 3:53:52 PM      Radiology Dg Chest  Portable 1 View  Result Date: 01/13/2017 CLINICAL DATA:  Pulmonary edema suspected in HD patient Hypertension with nosebleed. Upper mid to left chest pain began today. Productive cough began today with blood tinged phlegm EXAM: PORTABLE CHEST 1 VIEW COMPARISON:  12/24/2016 FINDINGS: Stable large cardiac silhouette there is bilateral perihilar airspace disease. Small effusions. No pneumothorax. No focal infiltrate. IMPRESSION: Cardiomegaly and central pulmonary edema. Electronically Signed   By: Genevive Bi M.D.   On: 01/13/2017 16:33    Procedures Procedures (including critical care time)  Medications Ordered in ED Medications  amLODipine (NORVASC) tablet 10 mg (10 mg Oral Given 01/13/17 1615)  labetalol (NORMODYNE) tablet 400 mg (400 mg Oral Given 01/13/17 1615)  nitroGLYCERIN (NITROSTAT) SL tablet 0.4 mg (0.4 mg Sublingual Given 01/13/17 1733)  pentafluoroprop-tetrafluoroeth (GEBAUERS) aerosol 1 application (not administered)  lidocaine (PF) (XYLOCAINE) 1 % injection 5 mL (not administered)  lidocaine-prilocaine (EMLA) cream 1 application (not administered)  0.9 %  sodium chloride infusion (not administered)  0.9 %  sodium chloride infusion (not administered)  heparin injection 1,000 Units (not administered)  alteplase (CATHFLO ACTIVASE) injection 2 mg (not administered)  acetaminophen (TYLENOL) tablet 650 mg (650 mg Oral Given 01/13/17 1915)  cloNIDine (CATAPRES) tablet 0.2 mg (not administered)  isosorbide mononitrate (IMDUR) 24 hr tablet 90 mg (not administered)  cinacalcet (SENSIPAR) tablet 60 mg (not administered)  sevelamer carbonate (RENVELA) tablet 2,400 mg (not administered)  hydrALAZINE (APRESOLINE) tablet 100 mg (not administered)  labetalol (NORMODYNE,TRANDATE) injection 10 mg (not administered)  heparin injection 5,000 Units (not administered)  sodium chloride flush (NS) 0.9 % injection 3 mL (not administered)  sodium chloride flush (NS) 0.9 % injection 3 mL (not  administered)  sodium chloride flush (NS) 0.9 % injection 3 mL (not administered)  0.9 %  sodium chloride infusion (not administered)  ondansetron (ZOFRAN) tablet 4 mg (not administered)    Or  ondansetron (ZOFRAN) injection 4 mg (not administered)  aspirin EC tablet 81 mg (not administered)  morphine 4 MG/ML injection 2 mg (not administered)  pantoprazole (PROTONIX)  EC tablet 40 mg (not administered)  doxercalciferol (HECTOROL) injection 7 mcg (not administered)  labetalol (NORMODYNE,TRANDATE) injection 20 mg (not administered)  doxercalciferol (HECTOROL) injection 7 mcg (7 mcg Intravenous Given 01/13/17 1922)  heparin injection (4,600 Units/hr Dialysis Given 01/13/17 1833)   Initial Impression / Assessment and Plan / ED Course  I have reviewed the triage vital signs and the nursing notes.  Pertinent labs & imaging results that were available during my care of the patient were reviewed by me and considered in my medical decision making (see chart for details).     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 Chest pain worsens with dyspnea. He endorses dry cough, dyspnea worse with positioning and lying down. Patient states he feels worsening shortness of breath when walking around his home.  Patient states he has not missed dialysis, last session was as scheduled on Saturday. He normally goes to 3-1/2 hour session where total of 4L normally is taken off. He cut his session Thursday short and skipped Saturday. Attends HD at [facility]. Patient states he has been out of his amlodipine and labetalol for the past 2 days.  Medical chart reviewed. Of note patient was admitted here approximately 2-1/2 weeks prior for pulmonary edema and underwent inpatient dialysis and medication optimization.  On exam minimal crackles auscultated bilaterally. Patient has bilateral leg  swelling 1+ pitting edema  Laboratory studies and imaging as ordered above. EKG reviewed. Sinus rhythm, T waves do not appear be compared to prior EKG from the other one month prior. T-wave inversion in V6 seen on previous EKG.  Blood pressure 212 systolic on initial triage. Patient given home amlodipine and labetalol for BP. Nitroglycerin for chest pain/dyspnea, as well as BP management. Placed on nasal cannula given SaO2 90%.  Of note I-stat troponin 0.07, potassium 4.8. Other laboratory studies consistent with patient's ESRD.  Nephrology consulted for inpatient dialysis. Hospitalist consulted for admission.  Reassessment 1720: Patient denies chest pain at this time. Repeat blood pressure 181 systolic.  Final Clinical Impressions(s) / ED Diagnoses   Final diagnoses:  Hypertensive urgency  Chest pain on breathing   New Prescriptions Current Discharge Medication List       Shaune Pollack, MD 01/14/17 0006

## 2017-01-13 NOTE — Progress Notes (Signed)
[facility] Brief Note  Called to provide HD for pt Usual TTS [facility] Skipped HD on Saturday 8/25 Shortened TMT to 3'40" on 8/23 Out of BP meds for 2 days In ED with CP, uncontrolled HTN, SOB K fine, but has edema on CXR, requiring supplemental O2.   Recent Labs  01/13/17 1532 01/13/17 1711  NA 142 139  K 4.4 4.8  CL 103 103  CO2 22  --   GLUCOSE 90 84  BUN 69* 73*  CREATININE 19.78* >18.00*  CALCIUM 8.5*  --     Recent Labs  01/13/17 1547  AST 18  ALT 13*  ALKPHOS 66  BILITOT 1.0  PROT 7.3  ALBUMIN 3.8    Recent Labs  01/13/17 1532 01/13/17 1711  WBC 11.0*  --   HGB 10.5* 10.9*  HCT 33.9* 32.0*  MCV 86.9  --   PLT 279  --     Dg Chest Portable 1 View  Result Date: 01/13/2017 CLINICAL DATA:  Pulmonary edema suspected in HD patient Hypertension with nosebleed. Upper mid to left chest pain began today. Productive cough began today with blood tinged phlegm EXAM: PORTABLE CHEST 1 VIEW COMPARISON:  12/24/2016 FINDINGS: Stable large cardiac silhouette there is bilateral perihilar airspace disease. Small effusions. No pneumothorax. No focal infiltrate. IMPRESSION: Cardiomegaly and central pulmonary edema. Electronically Signed   By: Genevive Bi M.D.   On: 01/13/2017 16:33   HD prescription: TTS [facility] 4 hours 400/800 2K 2.25 Ca Heparin 4600 units EDW 87.5 kg (left at 87.2 on 8/23) so will have lower EDW Mircera 100 mcg Q2weeks lasat dosed 8/18 (Hb 10.4 8/23)  Will provide HD for pt today Will have lower EDW at discharge [facility] will admit, control HTN.  Camille Bal, MD [facility] [phone] Pager 01/13/2017, 6:11 PM

## 2017-01-13 NOTE — Progress Notes (Signed)
HD tx completed @ 2240 w/o problem, UF goal met, blood rinsed back, increased bleeding time on Venous site, VSS w/ increased bp, pt has been blowing bleeding nose throughout tx but he states that's normal for him w/ his high bp and he hasn't had his bp meds today. Attempted to call report to primary nurse but she is in the middle of a procedure and will have to call me back

## 2017-01-13 NOTE — Consult Note (Signed)
[facility] Consultation Note Requesting Physician:  EDP Primary Nephrologist: Eliott Nine  Reason for Consult:  ESRD, missed last HD . HPI: The patient is a 21 y.o. year-old with ESRD 2/2 FSGS.  Usual TTS [facility] Previously in [place], transferred to [employer] as is attending [employer].  Skipped HD on Saturday 8/25.  Shortened TMT to 3'40" on 8/23.  Out of BP meds for 2 days. Says all of this as a result of transportation issues. Rides [employer] and otherwise has to rely on a buddy who takes him to pick up his meds, sometimes takes him to HD.   Came to ED with CP, uncontrolled HTN (212/136), SOB. K fine, but has edema on CXR, requiring supplemental O2. Also c/o productive cough. We are asked to see to provide HD. Pt febrile to 101.2 with initiation of HD. This is 2nd admission this month for complications of missed dialysis - very similar presentation 12/24/16.   Past Medical History:  Diagnosis Date  . Acute respiratory failure (HCC)   . ESRD (end stage renal disease) (HCC)   . FSGS (focal segmental glomerulosclerosis)   . Hypertension   . Nausea & vomiting 11/2016  . Renal disorder    on dialysis    Past Surgical History:  Procedure Laterality Date  . AV FISTULA PLACEMENT  12/2015  . RENAL BIOPSY  2013    Family History  Problem Relation Age of Onset  . Hypertension Mother   . Diabetes Father   . Hypertension Father   . Kidney disease Father   . Hypertension Maternal Grandmother   . Diabetes Paternal Grandmother   . Hypertension Paternal Grandmother    Social History:  reports that he has never smoked. He has never used smokeless tobacco. He reports that he does not drink alcohol or use drugs. Attending classes at [employer]  Allergies: No Known Allergies  Home medications: Prior to Admission medications   Medication Sig Start Date End Date Taking? Authorizing Provider  amLODipine (NORVASC) 10 MG tablet Take 10 mg by mouth daily.  07/15/14   [provider]  B Complex-C-Folic Acid  (B COMPLEX-VITAMIN C-FOLIC ACID) 1 MG tablet Take 1 tablet by mouth daily. 04/30/16   [provider]  cinacalcet (SENSIPAR) 30 MG tablet Take 2 tablets (60 mg total) by mouth daily with supper. 12/04/16   Rolly Salter, MD  cloNIDine (CATAPRES) 0.2 MG tablet Take 1 tablet (0.2 mg total) by mouth 3 (three) times daily. 12/27/16   Drema Dallas, MD  hydrALAZINE (APRESOLINE) 100 MG tablet Take 100 mg by mouth 3 (three) times daily.  04/13/16   [provider]  isosorbide mononitrate (IMDUR) 30 MG 24 hr tablet Take 3 tablets (90 mg total) by mouth daily. 12/27/16   Drema Dallas, MD  labetalol (NORMODYNE) 200 MG tablet Take 400 mg by mouth 3 (three) times daily. 03/16/16   [provider]  loperamide (IMODIUM A-D) 2 MG tablet Take 1 tablet (2 mg total) by mouth 4 (four) times daily as needed for diarrhea or loose stools. 02/13/16   Vanetta Mulders, MD  ondansetron (ZOFRAN ODT) 4 MG disintegrating tablet Take 1 tablet (4 mg total) by mouth every 8 (eight) hours as needed for nausea or vomiting. 12/02/16   Ward, Chase Picket, PA-C  sevelamer (RENAGEL) 800 MG tablet Take 2,400 mg by mouth 3 (three) times daily with meals.     [provider]   Inpatient medications: . amLODipine  10 mg Oral Daily  .  aspirin EC  81 mg Oral Daily  . cinacalcet  60 mg Oral Q supper  . cloNIDine  0.2 mg Oral TID  . heparin  5,000 Units Subcutaneous Q8H  . hydrALAZINE  100 mg Oral TID  . isosorbide mononitrate  90 mg Oral Daily  . labetalol  400 mg Oral TID  . labetalol  10 mg Intravenous Once  . pantoprazole  40 mg Oral Daily  . [START ON 01/14/2017] sevelamer carbonate  2,400 mg Oral TID WC  . sodium chloride flush  3 mL Intravenous Q12H  . sodium chloride flush  3 mL Intravenous Q12H    Review of Systems + lethargy "feels bad" Some SOB, cough productive CP prior to arrival none at present No abd pain, nausea or vomiting Some swelling  Physical Exam:  Blood pressure (!)  187/112, pulse 93, temperature (!) 101.2 F (38.4 C), temperature source Oral, resp. rate (!) 39, weight 91.4 kg (201 lb 8 oz), SpO2 99 %.  Gen: WDWN AAM. Seen in HD. Comfortable on 2L O2 Skin: no rash, cyanosis Blood at nares from epistaxis Neck: no no bruits or LAN Chest: Coarse BS without definite rales Heart: regular, no rub or gallop S4S1S2 No S3 Abdomen: soft, no focal tenderness Ext: 1+ edema Neuro: alert, Ox3, no focal deficit Heme/Lymph: no bruising or LAN Dialysis Access: L AVF cannulated  Labs:  Recent Labs Lab 01/13/17 1532 01/13/17 1711  NA 142 139  K 4.4 4.8  CL 103 103  CO2 22  --   GLUCOSE 90 84  BUN 69* 73*  CREATININE 19.78* >18.00*  CALCIUM 8.5*  --    Recent Labs Lab 01/13/17 1547  AST 18  ALT 13*  ALKPHOS 66  BILITOT 1.0  PROT 7.3  ALBUMIN 3.8    Recent Labs Lab 01/13/17 1532 01/13/17 1711  WBC 11.0*  --   HGB 10.5* 10.9*  HCT 33.9* 32.0*  MCV 86.9  --   PLT 279  --    Xrays/Other Studies: Dg Chest Portable 1 View  Result Date: 01/13/2017 CLINICAL DATA:  Pulmonary edema suspected in HD patient Hypertension with nosebleed. Upper mid to left chest pain began today. Productive cough began today with blood tinged phlegm EXAM: PORTABLE CHEST 1 VIEW COMPARISON:  12/24/2016 FINDINGS: Stable large cardiac silhouette there is bilateral perihilar airspace disease. Small effusions. No pneumothorax. No focal infiltrate. IMPRESSION: Cardiomegaly and central pulmonary edema. Electronically Signed   By: Genevive Bi M.D.   On: 01/13/2017 16:33   HD prescription: TTS [facility] 4 hours 400/800 2K 2.25 Ca Heparin 4600 units EDW 87.5 kg (left at 87.2 on 8/23) so will have lower EDW Mircera 100 mcg Q2weeks lasat dosed 8/18 (Hb 10.4 8/23)   Assessment/Recommendations  1. ESRD - uremic, volume overloaded. Skipped TMT on 8/25, shortened on 8/23. 2nd admission this month for this. Says transportation issue. Will have SW at HD unit re-address  with him. HD tonight. Left sl under EDW last TMT so will goal weight for 86.5 kg tonight. HD again tomorrow. 2. Uncontrolled HTN - out of meds for 2 days. Ready in the pharmacy but not picked up. Again says due to transportation. 3. Pulmonary edema - uncontrolled HTN/volume. Meds/HD. 4. Anemia - on outpt Mircera 100 Q2weeks. Last dosed 01/04/17. No ESA need at this time 5. Fever/productive cough - temp 101.2 pre HD. BC's done. No definite infiltrate on CXR. Mild leukocytosis. Tylenol. Defer ATB decision to [facility] 6. Secondary HPT -  On sensipar  60 outpt in addition to hectorol 7 mcg TIW. Continue binders   Camille Bal,  MD [facility] [phone] pager 01/13/2017, 8:29 PM

## 2017-01-13 NOTE — Progress Notes (Signed)
HD tx initiated via 15Gx2 w/o problem, pull/push/flush equally w/o problem, VSS, will cont to monitor while on HD tx 

## 2017-01-14 ENCOUNTER — Inpatient Hospital Stay (HOSPITAL_COMMUNITY): Payer: Medicaid Other

## 2017-01-14 LAB — CBC
HEMATOCRIT: 33.6 % — AB (ref 39.0–52.0)
HEMOGLOBIN: 10.6 g/dL — AB (ref 13.0–17.0)
MCH: 27.2 pg (ref 26.0–34.0)
MCHC: 31.5 g/dL (ref 30.0–36.0)
MCV: 86.4 fL (ref 78.0–100.0)
Platelets: 275 10*3/uL (ref 150–400)
RBC: 3.89 MIL/uL — ABNORMAL LOW (ref 4.22–5.81)
RDW: 15.5 % (ref 11.5–15.5)
WBC: 9.2 10*3/uL (ref 4.0–10.5)

## 2017-01-14 LAB — TROPONIN I
TROPONIN I: 0.07 ng/mL — AB (ref <0.03)
Troponin I: 0.07 ng/mL (ref <0.03)

## 2017-01-14 LAB — BASIC METABOLIC PANEL
ANION GAP: 14 (ref 5–15)
BUN: 41 mg/dL — ABNORMAL HIGH (ref 6–20)
CHLORIDE: 98 mmol/L — AB (ref 101–111)
CO2: 25 mmol/L (ref 22–32)
Calcium: 8.1 mg/dL — ABNORMAL LOW (ref 8.9–10.3)
Creatinine, Ser: 12.73 mg/dL — ABNORMAL HIGH (ref 0.61–1.24)
GFR calc Af Amer: 6 mL/min — ABNORMAL LOW (ref >60)
GFR calc non Af Amer: 5 mL/min — ABNORMAL LOW (ref >60)
GLUCOSE: 70 mg/dL (ref 65–99)
POTASSIUM: 3.9 mmol/L (ref 3.5–5.1)
Sodium: 137 mmol/L (ref 135–145)

## 2017-01-14 LAB — MAGNESIUM: MAGNESIUM: 1.9 mg/dL (ref 1.7–2.4)

## 2017-01-14 LAB — PHOSPHORUS: Phosphorus: 4.7 mg/dL — ABNORMAL HIGH (ref 2.5–4.6)

## 2017-01-14 MED ORDER — OXYMETAZOLINE HCL 0.05 % NA SOLN
1.0000 | Freq: Two times a day (BID) | NASAL | Status: DC | PRN
  Administered 2017-01-14: 1 via NASAL
  Filled 2017-01-14: qty 15

## 2017-01-14 MED ORDER — DOXERCALCIFEROL 4 MCG/2ML IV SOLN
INTRAVENOUS | Status: AC
  Administered 2017-01-14: 7 ug via INTRAVENOUS
  Filled 2017-01-14: qty 4

## 2017-01-14 MED ORDER — LORAZEPAM 0.5 MG PO TABS
0.5000 mg | ORAL_TABLET | Freq: Four times a day (QID) | ORAL | Status: DC | PRN
  Administered 2017-01-14: 0.5 mg via ORAL
  Filled 2017-01-14: qty 1

## 2017-01-14 MED ORDER — NEPRO/CARBSTEADY PO LIQD
237.0000 mL | Freq: Two times a day (BID) | ORAL | Status: DC
Start: 2017-01-15 — End: 2017-01-15
  Filled 2017-01-14 (×4): qty 237

## 2017-01-14 MED ORDER — HEPARIN SODIUM (PORCINE) 1000 UNIT/ML IJ SOLN
4600.0000 [IU] | Freq: Once | INTRAMUSCULAR | Status: AC
  Administered 2017-01-14: 4600 [IU] via INTRAVENOUS
  Filled 2017-01-14: qty 4.6

## 2017-01-14 NOTE — Progress Notes (Signed)
Assessment/Recommendations  1. ESRD - uremic, volume overloaded. After missed HD due to transportation . HD again today 2. Uncontrolled HTN - out of meds for 2 days. Ready in the pharmacy but not picked up. Again says due to transportation. 3. Pulmonary edema - uncontrolled HTN/volume. Meds/HD. 4. Anemia - on outpt Mircera 100 Q2weeks. Last dosed 01/04/17. No ESA need at this time 5. Fever/productive cough - temp 101.2 pre HD. BC's done.  6. Secondary HPT -  On sensipar 60 outpt in addition to hectorol 7 mcg TIW. Continue binders  Subjective: Interval History: {HD last PM well tol  Objective: Vital signs in last 24 hours: Temp:  [98.6 F (37 C)-101.2 F (38.4 C)] 99.3 F (37.4 C) (08/28 0314) Pulse Rate:  [77-97] 96 (08/28 0314) Resp:  [14-52] 20 (08/27 2357) BP: (167-214)/(87-147) 167/95 (08/28 0314) SpO2:  [90 %-100 %] 95 % (08/28 0314) Weight:  [86.5 kg (190 lb 11.2 oz)-91.4 kg (201 lb 8 oz)] 87 kg (191 lb 11.2 oz) (08/28 0314) Weight change:   Intake/Output from previous day: 08/27 0701 - 08/28 0700 In: -  Out: 4900  Intake/Output this shift: No intake/output data recorded.  General appearance: alert and cooperative Resp: clear to auscultation bilaterally Chest wall: no tenderness Cardio: regular rate and rhythm, S1, S2 normal, no murmur, click, rub or gallop Extremities: extremities normal, atraumatic, no cyanosis or edema  LUE AVF  Lab Results:  Recent Labs  01/13/17 1532 01/13/17 1711  WBC 11.0*  --   HGB 10.5* 10.9*  HCT 33.9* 32.0*  PLT 279  --    BMET:  Recent Labs  01/13/17 1532 01/13/17 1711  NA 142 139  K 4.4 4.8  CL 103 103  CO2 22  --   GLUCOSE 90 84  BUN 69* 73*  CREATININE 19.78* >18.00*  CALCIUM 8.5*  --    No results for input(s): PTH in the last 72 hours. Iron Studies: No results for input(s): IRON, TIBC, TRANSFERRIN, FERRITIN in the last 72 hours. Studies/Results: Dg Chest Portable 1 View  Result Date: 01/13/2017 CLINICAL DATA:   Pulmonary edema suspected in HD patient Hypertension with nosebleed. Upper mid to left chest pain began today. Productive cough began today with blood tinged phlegm EXAM: PORTABLE CHEST 1 VIEW COMPARISON:  12/24/2016 FINDINGS: Stable large cardiac silhouette there is bilateral perihilar airspace disease. Small effusions. No pneumothorax. No focal infiltrate. IMPRESSION: Cardiomegaly and central pulmonary edema. Electronically Signed   By: Genevive Bi M.D.   On: 01/13/2017 16:33    Scheduled: . amLODipine  10 mg Oral Daily  . aspirin EC  81 mg Oral Daily  . cinacalcet  60 mg Oral Q supper  . cloNIDine  0.2 mg Oral TID  . [START ON 01/16/2017] doxercalciferol  7 mcg Intravenous Q T,Th,Sa-HD  . heparin  5,000 Units Subcutaneous Q8H  . hydrALAZINE  100 mg Oral TID  . isosorbide mononitrate  90 mg Oral Daily  . labetalol  400 mg Oral TID  . pantoprazole  40 mg Oral Daily  . sevelamer carbonate  2,400 mg Oral TID WC  . sodium chloride flush  3 mL Intravenous Q12H  . sodium chloride flush  3 mL Intravenous Q12H    LOS: 1 day   Roseana Rhine C 01/14/2017,8:13 AM

## 2017-01-14 NOTE — Progress Notes (Addendum)
PROGRESS NOTE    Jeremy Huerta   WNI:[id number]  DOB: Sep 27, 1995  DOA: 01/13/2017 PCP: [name], MD   Brief Narrative:  Jeremy Huerta 21 Y/O with FSGS ESRD, HTN, secondary hyperparathyroidism, AOCD and GERD; who presented to ED with SOB, pleuritic CP, cough, nosebleed and headaches. Patient missed his HD treatment on Saturday (8/25) and also shortened HD treatment on Thursday (8/23).   He had a recent admission with hypertensive emergency and pulmonary edema due to same medication/treatment non-compliance. Patient also expressed being w/o 2 of his antihypertensive meds for the last 3 days or so because of inability to get to the pharmacy (transportation). Had chest pain associated with coughing & fever of 101 in ER.   Subjective: No cough, dyspnea, nausea, vomiting, fever, chills, constipation or  diarrhea today. He does not urinate. No rash.   Assessment & Plan:   Principal Problem:   Hypertensive emergency/   Bleeding from the nose - due to missing dialysis not being able to pick up his meds - to solve this issue with meds, will need paper prescriptions this time and will need to take them to a pharmacy that can deliver his meds to his residence   Active Problems:   ESRD (end stage renal disease) - FSGN - dialyzed yesterday - being dialyzed again today   Cough/ fever - CXR > pulm edema - no antibiotics- symptoms resolved for now- following    History of anemia due to chronic kidney disease - Micera per Nephrology    Secondary hyperparathyroidism  - cont binders  DVT prophylaxis: Heparin Code Status: Full code Family Communication:  Disposition Plan: home tomorrow if no further dialysis needed Consultants:   nephrology Procedures:    Antimicrobials:  Anti-infectives    None       Objective: Vitals:   01/14/17 0033 01/14/17 0055 01/14/17 0221 01/14/17 0314  BP: (!) 181/122 (!) 185/124 (!) 175/114 (!) 167/95  Pulse: 96   96  Resp:        Temp:    99.3 F (37.4 C)  TempSrc:    Oral  SpO2:    95%  Weight:    87 kg (191 lb 11.2 oz)  Height:    6' (1.829 m)    Intake/Output Summary (Last 24 hours) at 01/14/17 1055 Last data filed at 01/13/17 2240  Gross per 24 hour  Intake                0 ml  Output             4900 ml  Net            -4900 ml   Filed Weights   01/13/17 1830 01/13/17 2317 01/14/17 0314  Weight: 91.4 kg (201 lb 8 oz) 86.5 kg (190 lb 11.2 oz) 87 kg (191 lb 11.2 oz)    Examination: General exam: Appears comfortable  HEENT: PERRLA, oral mucosa moist, no sclera icterus or thrush Respiratory system: Clear to auscultation. Respiratory effort normal. Cardiovascular system: S1 & S2 heard, RRR.  No murmurs  Gastrointestinal system: Abdomen soft, non-tender, nondistended. Normal bowel sound. No organomegaly Central nervous system: Alert and oriented. No focal neurological deficits. Extremities: No cyanosis, clubbing or edema Skin: No rashes or ulcers Psychiatry:  Mood & affect appropriate.     Data Reviewed: I have personally reviewed following labs and imaging studies  CBC:  Recent Labs Lab 01/13/17 1532 01/13/17 1711 01/14/17 0741  WBC 11.0*  --  9.2  HGB 10.5* 10.9* 10.6*  HCT 33.9* 32.0* 33.6*  MCV 86.9  --  86.4  PLT 279  --  275   Basic Metabolic Panel:  Recent Labs Lab 01/13/17 1532 01/13/17 1711 01/13/17 2354 01/14/17 0741  NA 142 139  --  137  K 4.4 4.8  --  3.9  CL 103 103  --  98*  CO2 22  --   --  25  GLUCOSE 90 84  --  70  BUN 69* 73*  --  41*  CREATININE 19.78* >18.00*  --  12.73*  CALCIUM 8.5*  --   --  8.1*  MG  --   --  1.9  --   PHOS  --   --  4.7*  --    GFR: Estimated Creatinine Clearance: 9.9 mL/min (A) (by C-G formula based on SCr of 12.73 mg/dL (H)). Liver Function Tests:  Recent Labs Lab 01/13/17 1547  AST 18  ALT 13*  ALKPHOS 66  BILITOT 1.0  PROT 7.3  ALBUMIN 3.8   No results for input(s): LIPASE, AMYLASE in the last 168 hours. No results  for input(s): AMMONIA in the last 168 hours. Coagulation Profile: No results for input(s): INR, PROTIME in the last 168 hours. Cardiac Enzymes:  Recent Labs Lab 01/13/17 2354 01/14/17 0741  TROPONINI 0.07* 0.07*   BNP (last 3 results) No results for input(s): PROBNP in the last 8760 hours. HbA1C: No results for input(s): HGBA1C in the last 72 hours. CBG: No results for input(s): GLUCAP in the last 168 hours. Lipid Profile: No results for input(s): CHOL, HDL, LDLCALC, TRIG, CHOLHDL, LDLDIRECT in the last 72 hours. Thyroid Function Tests: No results for input(s): TSH, T4TOTAL, FREET4, T3FREE, THYROIDAB in the last 72 hours. Anemia Panel: No results for input(s): VITAMINB12, FOLATE, FERRITIN, TIBC, IRON, RETICCTPCT in the last 72 hours. Urine analysis:    Component Value Date/Time   COLORURINE YELLOW 07/20/2014 0114   APPEARANCEUR CLOUDY (A) 07/20/2014 0114   LABSPEC 1.013 07/20/2014 0114   PHURINE 6.0 07/20/2014 0114   GLUCOSEU NEGATIVE 07/20/2014 0114   HGBUR SMALL (A) 07/20/2014 0114   BILIRUBINUR NEGATIVE 07/20/2014 0114   KETONESUR NEGATIVE 07/20/2014 0114   PROTEINUR >300 (A) 07/20/2014 0114   UROBILINOGEN 0.2 07/20/2014 0114   NITRITE NEGATIVE 07/20/2014 0114   LEUKOCYTESUR NEGATIVE 07/20/2014 0114   Sepsis Labs: @LABRCNTIP (procalcitonin:4,lacticidven:4) )No results found for this or any previous visit (from the past 240 hour(s)).       Radiology Studies: X-ray Chest Pa And Lateral  Result Date: 01/14/2017 CLINICAL DATA:  Shortness of breath, hypertension, history of respiratory failure, dialysis dependent renal failure. EXAM: CHEST  2 VIEW COMPARISON:  Portable chest x-ray of January 13, 2017 FINDINGS: The lungs are adequately inflated. Fluffy alveolar opacities seen yesterday have nearly totally cleared. The pulmonary vascularity remains mildly engorged. There small bilateral pleural effusions. The cardiac silhouette remains enlarged. The trachea is midline. The  bony thorax is unremarkable. IMPRESSION: Improving CHF. Persistent mild central pulmonary vascular congestion and cardiomegaly. Small bilateral pleural effusions peer Electronically Signed   By: David  [place] M.D.   On: 01/14/2017 10:45   Dg Chest Portable 1 View  Result Date: 01/13/2017 CLINICAL DATA:  Pulmonary edema suspected in HD patient Hypertension with nosebleed. Upper mid to left chest pain began today. Productive cough began today with blood tinged phlegm EXAM: PORTABLE CHEST 1 VIEW COMPARISON:  12/24/2016 FINDINGS: Stable large cardiac silhouette there is bilateral perihilar airspace disease. Small effusions. No pneumothorax. No  focal infiltrate. IMPRESSION: Cardiomegaly and central pulmonary edema. Electronically Signed   By: Genevive Bi M.D.   On: 01/13/2017 16:33      Scheduled Meds: . amLODipine  10 mg Oral Daily  . aspirin EC  81 mg Oral Daily  . cinacalcet  60 mg Oral Q supper  . cloNIDine  0.2 mg Oral TID  . [START ON 01/16/2017] doxercalciferol  7 mcg Intravenous Q T,Th,Sa-HD  . heparin  4,600 Units Intravenous Once  . heparin  5,000 Units Subcutaneous Q8H  . hydrALAZINE  100 mg Oral TID  . isosorbide mononitrate  90 mg Oral Daily  . labetalol  400 mg Oral TID  . pantoprazole  40 mg Oral Daily  . sevelamer carbonate  2,400 mg Oral TID WC  . sodium chloride flush  3 mL Intravenous Q12H  . sodium chloride flush  3 mL Intravenous Q12H   Continuous Infusions: . sodium chloride    . sodium chloride    . sodium chloride       LOS: 1 day    Time spent in minutes: 35    Calvert Cantor, MD [facility] Pager: [email or web address] Password [facility] 01/14/2017, 10:55 AM

## 2017-01-14 NOTE — Progress Notes (Signed)
   01/13/17 2357  Vitals  Temp 99.5 F (37.5 C)  Temp Source Oral  BP (!) 178/120 (RN notified)  BP Location Right Arm  BP Method Automatic  Patient Position (if appropriate) Sitting  Pulse Rate 90  Pulse Rate Source Dinamap  Cardiac Rhythm NSR  Resp 20  Oxygen Therapy  SpO2 98 %  O2 Device Room Air  Pain Assessment  Pain Assessment No/denies pain  PCA/Epidural/Spinal Assessment  Respiratory Pattern Regular;Unlabored;Dyspnea with exertion  Height and Weight  Height 6' (1.829 m)  Weight in (lb) to have BMI = 25 183.9   Pt received from Dialysis. Pt nose was bleeding upon arrival. RN held pressure with gauze w/ no relief. RN administered all of pts BP medicine from the ED.  Pt reported some anxiety d/t his nose bleeding. Txtpaged Dr. Antionette Char for anxiety medicine. MD also made aware of pt. Blood pressure as well as nose bleeding. Received new order for ativan and Afrin nasal spray. Pt given food and gingerale. Pt. Also educated regarding call bell, room, and unit. Will monitor closely.

## 2017-01-14 NOTE — Progress Notes (Signed)
CRITICAL VALUE ALERT  Critical Value:  Troponin 0.07  Date & Time Notied:  01/14/17  Provider Notified: Dr. Antionette Char  Orders Received/Actions taken: Pt denies pain. No new orders at this time. Will monitor.

## 2017-01-14 NOTE — Progress Notes (Signed)
Initial Nutrition Assessment  DOCUMENTATION CODES:   Not applicable  INTERVENTION:    HS snack daily  Nepro Shake po BID, each supplement provides 425 kcal and 19 grams protein  NUTRITION DIAGNOSIS:   Increased nutrient needs related to chronic illness as evidenced by estimated needs.  GOAL:   Patient will meet greater than or equal to 90% of their needs  MONITOR:   PO intake, Supplement acceptance, Weight trends  REASON FOR ASSESSMENT:   Malnutrition Screening Tool    ASSESSMENT:    21 year old male with PMH of FSGS ESRD, HTN, secondary hyperparathyroidism, GERD who was admitted on 8/27 with headache, nosebleed, and chest discomfort.  Patient reports recent decrease in appetite and that he has not been feeling like eating breakfast lately. He usually tries to follow a low sodium diet at home and is aware of which foods contain potassium, phosphorus, and sodium. Usual dry weight is 88 kg, down to 85.3 kg after dialysis today.  Nutrition-Focused physical exam completed. Findings are no fat depletion, no muscle depletion, and no edema.   Labs reviewed: phosphorus 4.7 (H) Medications reviewed.   Diet Order:  Diet renal with fluid restriction Fluid restriction: 2000 mL Fluid; Room service appropriate? Yes; Fluid consistency: Thin  Skin:  Reviewed, no issues  Last BM:  8/27  Height:   Ht Readings from Last 1 Encounters:  01/14/17 6' (1.829 m)    Weight:   Wt Readings from Last 1 Encounters:  01/14/17 188 lb 0.8 oz (85.3 kg)    Ideal Body Weight:  80.9 kg  BMI:  Body mass index is 25.5 kg/m.  Estimated Nutritional Needs:   Kcal:  2500-2800  Protein:  100-120 gm  Fluid:  1.2 L  EDUCATION NEEDS:   No education needs identified at this time  Joaquin Courts, RD, LDN, CNSC Pager [phone] After Hours Pager [phone]

## 2017-01-15 DIAGNOSIS — I160 Hypertensive urgency: Secondary | ICD-10-CM

## 2017-01-15 MED ORDER — ASPIRIN 81 MG PO TBEC: 81.0000 mg | DELAYED_RELEASE_TABLET | Freq: Every day | ORAL | 0 refills | Status: DC

## 2017-01-15 MED ORDER — DOXERCALCIFEROL 4 MCG/2ML IV SOLN: 7.0000 ug | INTRAVENOUS | Status: DC

## 2017-01-15 MED ORDER — NEPRO/CARBSTEADY PO LIQD: 237.0000 mL | Freq: Two times a day (BID) | ORAL | 0 refills | Status: DC

## 2017-01-15 MED ORDER — OXYMETAZOLINE HCL 0.05 % NA SOLN: 1.0000 | Freq: Two times a day (BID) | NASAL | 0 refills | Status: DC | PRN

## 2017-01-15 NOTE — Progress Notes (Signed)
Assessment/Recommendations  1. ESRD- uremic, volume overloaded. Improved 2. Uncontrolled HTN- Improving on meds/HD 3. Pulmonary edema- resolved       Plan OK for discharge from renal perspective   Subjective: Interval History: Better  Objective: Vital signs in last 24 hours: Temp:  [98.6 F (37 C)-98.9 F (37.2 C)] 98.9 F (37.2 C) (08/29 0450) Pulse Rate:  [68-83] 77 (08/29 0450) Resp:  [16-25] 20 (08/29 0450) BP: (133-162)/(73-103) 158/103 (08/29 0450) SpO2:  [96 %-100 %] 98 % (08/29 0450) Weight:  [85.3 kg (188 lb 0.8 oz)-87.2 kg (192 lb 3.9 oz)] 86.5 kg (190 lb 11.2 oz) (08/29 0450) Weight change: -4.2 kg (-9 lb 4.2 oz)  Intake/Output from previous day: 08/28 0701 - 08/29 0700 In: 1149 [P.O.:1146; I.V.:3] Out: 2000  Intake/Output this shift: Total I/O In: 120 [P.O.:120] Out: -   General appearance: alert and cooperative  Lab Results:  Recent Labs  01/13/17 1532 01/13/17 1711 01/14/17 0741  WBC 11.0*  --  9.2  HGB 10.5* 10.9* 10.6*  HCT 33.9* 32.0* 33.6*  PLT 279  --  275   BMET:  Recent Labs  01/13/17 1532 01/13/17 1711 01/14/17 0741  NA 142 139 137  K 4.4 4.8 3.9  CL 103 103 98*  CO2 22  --  25  GLUCOSE 90 84 70  BUN 69* 73* 41*  CREATININE 19.78* >18.00* 12.73*  CALCIUM 8.5*  --  8.1*   No results for input(s): PTH in the last 72 hours. Iron Studies: No results for input(s): IRON, TIBC, TRANSFERRIN, FERRITIN in the last 72 hours. Studies/Results: X-ray Chest Pa And Lateral  Result Date: 01/14/2017 CLINICAL DATA:  Shortness of breath, hypertension, history of respiratory failure, dialysis dependent renal failure. EXAM: CHEST  2 VIEW COMPARISON:  Portable chest x-ray of January 13, 2017 FINDINGS: The lungs are adequately inflated. Fluffy alveolar opacities seen yesterday have nearly totally cleared. The pulmonary vascularity remains mildly engorged. There small bilateral pleural effusions. The cardiac silhouette remains enlarged. The trachea  is midline. The bony thorax is unremarkable. IMPRESSION: Improving CHF. Persistent mild central pulmonary vascular congestion and cardiomegaly. Small bilateral pleural effusions peer Electronically Signed   By: David  [place] M.D.   On: 01/14/2017 10:45   Dg Chest Portable 1 View  Result Date: 01/13/2017 CLINICAL DATA:  Pulmonary edema suspected in HD patient Hypertension with nosebleed. Upper mid to left chest pain began today. Productive cough began today with blood tinged phlegm EXAM: PORTABLE CHEST 1 VIEW COMPARISON:  12/24/2016 FINDINGS: Stable large cardiac silhouette there is bilateral perihilar airspace disease. Small effusions. No pneumothorax. No focal infiltrate. IMPRESSION: Cardiomegaly and central pulmonary edema. Electronically Signed   By: Genevive Bi M.D.   On: 01/13/2017 16:33    Scheduled: . amLODipine  10 mg Oral Daily  . aspirin EC  81 mg Oral Daily  . cinacalcet  60 mg Oral Q supper  . cloNIDine  0.2 mg Oral TID  . [START ON 01/16/2017] doxercalciferol  7 mcg Intravenous Q T,Th,Sa-HD  . feeding supplement (NEPRO CARB STEADY)  237 mL Oral BID BM  . heparin  5,000 Units Subcutaneous Q8H  . hydrALAZINE  100 mg Oral TID  . isosorbide mononitrate  90 mg Oral Daily  . labetalol  400 mg Oral TID  . pantoprazole  40 mg Oral Daily  . sevelamer carbonate  2,400 mg Oral TID WC  . sodium chloride flush  3 mL Intravenous Q12H  . sodium chloride flush  3 mL Intravenous Q12H  LOS: 2 days   Farley Crooker C 01/15/2017,10:18 AM

## 2017-01-15 NOTE — Discharge Summary (Signed)
Physician Discharge Summary  Jeremy Huerta XLK:[id number] DOB: Mar 19, 1996 DOA: 01/13/2017  PCP: [name], MD  Admit date: 01/13/2017 Discharge date: 01/15/2017  Admitted From: Home.  Disposition: Home.   Recommendations for Outpatient Follow-up:  1. Follow up with PCP in 1-2 weeks 2. Please obtain BMP/CBC in one week    Discharge Condition:stable.  CODE STATUS: full code.  Diet recommendation: Heart Healthy   Brief/Interim Summary: Jeremy Huerta 21 Y/O with FSGS ESRD, HTN, secondary hyperparathyroidism, AOCD and GERD presented to ED with SOB, pleuritic CP, cough, nosebleed and headaches. Patient missed his HD treatment on Saturday (8/25) and also shortened HD treatment on Thursday (8/23).   He had a recent admission with hypertensive emergency and pulmonary edema due to same medication/treatment non-compliance. Patient also expressed being w/o 2 of his antihypertensive meds for the last 3 days or so because of inability to get to the pharmacy (transportation). He underwent HD and his symptoms resolved. His nose bleeding has improved and his BP improved.     Discharge Diagnoses:  Principal Problem:   Hypertensive emergency Active Problems:   ESRD (end stage renal disease) (HCC)   History of anemia due to chronic kidney disease   Secondary hyperparathyroidism (HCC)   Bleeding from the nose   Fever  Hypertensive emergency/   Bleeding from the nose - due to missing dialysis not being able to pick up his meds - he reports he does not need any prescriptions at this time, he will pick up his meds from the pharmacy.      ESRD (end stage renal disease) - FSGN - as per nephrology.    Cough/ fever - CXR > pulm edema - no antibiotics- symptoms resolved for now    History of anemia due to chronic kidney disease - outpatient follow up with nephrologist.     Secondary hyperparathyroidism  - continue with  binders   Discharge Instructions  Discharge  Instructions    Diet - low sodium heart healthy    Complete by:  As directed    Discharge instructions    Complete by:  As directed    Follow up with PCP in 1 to 2  Days.     Allergies as of 01/15/2017   No Known Allergies     Medication List    STOP taking these medications   loperamide 2 MG tablet Commonly known as:  IMODIUM A-D     TAKE these medications   amLODipine 10 MG tablet Commonly known as:  NORVASC Take 10 mg by mouth daily.   aspirin 81 MG EC tablet Take 1 tablet (81 mg total) by mouth daily.   B complex-vitamin C-folic acid 1 MG tablet Take 1 tablet by mouth daily.   cinacalcet 30 MG tablet Commonly known as:  SENSIPAR Take 2 tablets (60 mg total) by mouth daily with supper.   cloNIDine 0.2 MG tablet Commonly known as:  CATAPRES Take 1 tablet (0.2 mg total) by mouth 3 (three) times daily.   doxercalciferol 4 MCG/2ML injection Commonly known as:  HECTOROL Inject 3.5 mLs (7 mcg total) into the vein Every Tuesday,Thursday,and Saturday with dialysis.   feeding supplement (NEPRO CARB STEADY) Liqd Take 237 mLs by mouth 2 (two) times daily between meals.   hydrALAZINE 100 MG tablet Commonly known as:  APRESOLINE Take 100 mg by mouth 3 (three) times daily.   isosorbide mononitrate 30 MG 24 hr tablet Commonly known as:  IMDUR Take 3 tablets (90 mg total) by mouth daily.  labetalol 200 MG tablet Commonly known as:  NORMODYNE Take 400 mg by mouth 3 (three) times daily.   ondansetron 4 MG disintegrating tablet Commonly known as:  ZOFRAN ODT Take 1 tablet (4 mg total) by mouth every 8 (eight) hours as needed for nausea or vomiting.   oxymetazoline 0.05 % nasal spray Commonly known as:  AFRIN Place 1 spray into both nostrils 2 (two) times daily as needed (for nose bleed, soak cotton ball and hold with pressure with it in bleeding nare).   sevelamer 800 MG tablet Commonly known as:  RENAGEL Take 2,400 mg by mouth 3 (three) times daily with meals.             Discharge Care Instructions        Start     Ordered   01/16/17 0000  doxercalciferol (HECTOROL) 4 MCG/2ML injection  Every T-Th-Sa (Hemodialysis)     01/15/17 1113   01/16/17 0000  aspirin EC 81 MG EC tablet  Daily     01/15/17 1113   01/15/17 0000  Nutritional Supplements (FEEDING SUPPLEMENT, NEPRO CARB STEADY,) LIQD  2 times daily between meals     01/15/17 1113   01/15/17 0000  oxymetazoline (AFRIN) 0.05 % nasal spray  2 times daily PRN     01/15/17 1113   01/15/17 0000  Diet - low sodium heart healthy     01/15/17 1113   01/15/17 0000  Discharge instructions    Comments:  Follow up with PCP in 1 to 2  Days.   01/15/17 1113     Follow-up Information    [name], MD. Schedule an appointment as soon as possible for a visit in 1 week(s).   Specialty:  Nephrology Contact information: [address] [place] [address] [phone]          No Known Allergies  Consultations: Nephrology.   Procedures/Studies: X-ray Chest Pa And Lateral  Result Date: 01/14/2017 CLINICAL DATA:  Shortness of breath, hypertension, history of respiratory failure, dialysis dependent renal failure. EXAM: CHEST  2 VIEW COMPARISON:  Portable chest x-ray of January 13, 2017 FINDINGS: The lungs are adequately inflated. Fluffy alveolar opacities seen yesterday have nearly totally cleared. The pulmonary vascularity remains mildly engorged. There small bilateral pleural effusions. The cardiac silhouette remains enlarged. The trachea is midline. The bony thorax is unremarkable. IMPRESSION: Improving CHF. Persistent mild central pulmonary vascular congestion and cardiomegaly. Small bilateral pleural effusions peer Electronically Signed   By: David  [place] M.D.   On: 01/14/2017 10:45   Dg Chest 2 View  Result Date: 12/24/2016 Delano Metz, MD     12/24/2016  2:11 PM I was present at this dialysis session, have reviewed the session itself and made  appropriate changes Vinson Moselle MD [facility] pager [phone]  12/24/2016, 2:11 PM   Dg Chest Portable 1 View  Result Date: 01/13/2017 CLINICAL DATA:  Pulmonary edema suspected in HD patient Hypertension with nosebleed. Upper mid to left chest pain began today. Productive cough began today with blood tinged phlegm EXAM: PORTABLE CHEST 1 VIEW COMPARISON:  12/24/2016 FINDINGS: Stable large cardiac silhouette there is bilateral perihilar airspace disease. Small effusions. No pneumothorax. No focal infiltrate. IMPRESSION: Cardiomegaly and central pulmonary edema. Electronically Signed   By: Genevive Bi M.D.   On: 01/13/2017 16:33   Dg Chest Port 1 View  Result Date: 12/24/2016 CLINICAL DATA:  Acute onset of shortness of breath. Initial encounter. EXAM: PORTABLE CHEST 1 VIEW COMPARISON:  Chest  radiograph performed 11/19/2016 FINDINGS: Bibasilar airspace opacities, right greater than left, raise concern for multifocal pneumonia. Pulmonary edema could have a similar appearance. No pleural effusion or pneumothorax is seen. The cardiomediastinal silhouette is borderline enlarged. No acute osseous abnormalities are seen. IMPRESSION: 1. Bibasilar airspace opacities, right greater than left, raise concern for multifocal pneumonia. Pulmonary edema could have a similar appearance. 2. Borderline cardiomegaly. Electronically Signed   By: Roanna Raider M.D.   On: 12/24/2016 05:13       Subjective: No new complaints, no chest pain or sob.    Discharge Exam: Vitals:   01/14/17 2237 01/15/17 0450  BP:  (!) 158/103  Pulse: 80 77  Resp: 16 20  Temp: 98.7 F (37.1 C) 98.9 F (37.2 C)  SpO2: 99% 98%   Vitals:   01/14/17 1512 01/14/17 1552 01/14/17 2237 01/15/17 0450  BP: (!) 160/91 (!) 162/100  (!) 158/103  Pulse: 73 82 80 77  Resp: (!) 22 (!) 21 16 20   Temp: 98.7 F (37.1 C) 98.6 F (37 C) 98.7 F (37.1 C) 98.9 F (37.2 C)  TempSrc: Oral Oral Oral Oral  SpO2: 99% 100% 99% 98%  Weight: 85.3 kg (188 lb  0.8 oz)   86.5 kg (190 lb 11.2 oz)  Height:        General: Pt is alert, awake, not in acute distress Cardiovascular: RRR, S1/S2 +, no rubs, no gallops Respiratory: CTA bilaterally, no wheezing, no rhonchi Abdominal: Soft, NT, ND, bowel sounds + Extremities: no edema, no cyanosis    The results of significant diagnostics from this hospitalization (including imaging, microbiology, ancillary and laboratory) are listed below for reference.     Microbiology: Recent Results (from the past 240 hour(s))  Culture, blood (routine x 2)     Status: None (Preliminary result)   Collection Time: 01/13/17  6:36 PM  Result Value Ref Range Status   Specimen Description BLOOD AVF  Final   Special Requests   Final    BOTTLES DRAWN AEROBIC AND ANAEROBIC Blood Culture results may not be optimal due to an excessive volume of blood received in culture bottles   Culture NO GROWTH 2 DAYS  Final   Report Status PENDING  Incomplete  Culture, blood (routine x 2)     Status: None (Preliminary result)   Collection Time: 01/13/17  7:00 PM  Result Value Ref Range Status   Specimen Description BLOOD AVF  Final   Special Requests   Final    BOTTLES DRAWN AEROBIC AND ANAEROBIC Blood Culture results may not be optimal due to an excessive volume of blood received in culture bottles   Culture NO GROWTH 2 DAYS  Final   Report Status PENDING  Incomplete     Labs: BNP (last 3 results)  Recent Labs  01/13/17 1358  BNP 2,858.3*   Basic Metabolic Panel:  Recent Labs Lab 01/13/17 1532 01/13/17 1711 01/13/17 2354 01/14/17 0741  NA 142 139  --  137  K 4.4 4.8  --  3.9  CL 103 103  --  98*  CO2 22  --   --  25  GLUCOSE 90 84  --  70  BUN 69* 73*  --  41*  CREATININE 19.78* >18.00*  --  12.73*  CALCIUM 8.5*  --   --  8.1*  MG  --   --  1.9  --   PHOS  --   --  4.7*  --    Liver Function Tests:  Recent  Labs Lab 01/13/17 1547  AST 18  ALT 13*  ALKPHOS 66  BILITOT 1.0  PROT 7.3  ALBUMIN 3.8    No results for input(s): LIPASE, AMYLASE in the last 168 hours. No results for input(s): AMMONIA in the last 168 hours. CBC:  Recent Labs Lab 01/13/17 1532 01/13/17 1711 01/14/17 0741  WBC 11.0*  --  9.2  HGB 10.5* 10.9* 10.6*  HCT 33.9* 32.0* 33.6*  MCV 86.9  --  86.4  PLT 279  --  275   Cardiac Enzymes:  Recent Labs Lab 01/13/17 2354 01/14/17 0741  TROPONINI 0.07* 0.07*   BNP: Invalid input(s): POCBNP CBG: No results for input(s): GLUCAP in the last 168 hours. D-Dimer No results for input(s): DDIMER in the last 72 hours. Hgb A1c No results for input(s): HGBA1C in the last 72 hours. Lipid Profile No results for input(s): CHOL, HDL, LDLCALC, TRIG, CHOLHDL, LDLDIRECT in the last 72 hours. Thyroid function studies No results for input(s): TSH, T4TOTAL, T3FREE, THYROIDAB in the last 72 hours.  Invalid input(s): FREET3 Anemia work up No results for input(s): VITAMINB12, FOLATE, FERRITIN, TIBC, IRON, RETICCTPCT in the last 72 hours. Urinalysis    Component Value Date/Time   COLORURINE YELLOW 07/20/2014 0114   APPEARANCEUR CLOUDY (A) 07/20/2014 0114   LABSPEC 1.013 07/20/2014 0114   PHURINE 6.0 07/20/2014 0114   GLUCOSEU NEGATIVE 07/20/2014 0114   HGBUR SMALL (A) 07/20/2014 0114   BILIRUBINUR NEGATIVE 07/20/2014 0114   KETONESUR NEGATIVE 07/20/2014 0114   PROTEINUR >300 (A) 07/20/2014 0114   UROBILINOGEN 0.2 07/20/2014 0114   NITRITE NEGATIVE 07/20/2014 0114   LEUKOCYTESUR NEGATIVE 07/20/2014 0114   Sepsis Labs Invalid input(s): PROCALCITONIN,  WBC,  LACTICIDVEN Microbiology Recent Results (from the past 240 hour(s))  Culture, blood (routine x 2)     Status: None (Preliminary result)   Collection Time: 01/13/17  6:36 PM  Result Value Ref Range Status   Specimen Description BLOOD AVF  Final   Special Requests   Final    BOTTLES DRAWN AEROBIC AND ANAEROBIC Blood Culture results may not be optimal due to an excessive volume of blood received in culture  bottles   Culture NO GROWTH 2 DAYS  Final   Report Status PENDING  Incomplete  Culture, blood (routine x 2)     Status: None (Preliminary result)   Collection Time: 01/13/17  7:00 PM  Result Value Ref Range Status   Specimen Description BLOOD AVF  Final   Special Requests   Final    BOTTLES DRAWN AEROBIC AND ANAEROBIC Blood Culture results may not be optimal due to an excessive volume of blood received in culture bottles   Culture NO GROWTH 2 DAYS  Final   Report Status PENDING  Incomplete     Time coordinating discharge: Over 30 minutes  SIGNED:   Kathlen Mody, MD  [facility] 01/15/2017, 12:26 PM Pager   If 7PM-7AM, please contact night-coverage [email or web address] Password [email or web address]

## 2017-01-15 NOTE — Plan of Care (Signed)
Problem: Fluid Volume: Goal: Ability to maintain a balanced intake and output will improve Outcome: Progressing Patient intake stay below 2,03400ml/day  Problem: Nutrition: Goal: Adequate nutrition will be maintained Patient did not want the nutritional supplement recommend by RD, he did agree to have bedtime snack

## 2017-01-15 NOTE — Care Management (Signed)
1213 01-15-17 Pt is without PCP at this time. Pt has Medicaid- CM to provide pt with a list of PCP's that will accept Medicaid patients. Pt will need to contact the provider and schedule appointment. No further needs from CM at this time. Gala LewandowskyGraves-Bigelow, Sakari Alkhatib Kaye, RN,BSN [phone]

## 2017-01-18 LAB — CULTURE, BLOOD (ROUTINE X 2)
Culture: NO GROWTH
Culture: NO GROWTH

## 2017-03-04 ENCOUNTER — Emergency Department (HOSPITAL_COMMUNITY): Payer: Medicaid Other

## 2017-03-04 ENCOUNTER — Observation Stay (HOSPITAL_COMMUNITY): Payer: Medicaid Other

## 2017-03-04 ENCOUNTER — Inpatient Hospital Stay (HOSPITAL_COMMUNITY)
Admission: EM | Admit: 2017-03-04 | Discharge: 2017-03-07 | DRG: 304 | Disposition: A | Discharge status: Home / Self Care | Payer: Medicaid Other | Attending: Family Medicine | Admitting: Family Medicine

## 2017-03-04 ENCOUNTER — Encounter (HOSPITAL_COMMUNITY): Payer: Self-pay

## 2017-03-04 DIAGNOSIS — M545 Low back pain: Secondary | ICD-10-CM | POA: Diagnosis present

## 2017-03-04 DIAGNOSIS — I160 Hypertensive urgency: Secondary | ICD-10-CM | POA: Diagnosis not present

## 2017-03-04 DIAGNOSIS — I1311 Hypertensive heart and chronic kidney disease without heart failure, with stage 5 chronic kidney disease, or end stage renal disease: Secondary | ICD-10-CM | POA: Diagnosis present

## 2017-03-04 DIAGNOSIS — D649 Anemia, unspecified: Secondary | ICD-10-CM | POA: Diagnosis present

## 2017-03-04 DIAGNOSIS — K219 Gastro-esophageal reflux disease without esophagitis: Secondary | ICD-10-CM | POA: Diagnosis present

## 2017-03-04 DIAGNOSIS — L0292 Furuncle, unspecified: Secondary | ICD-10-CM | POA: Diagnosis present

## 2017-03-04 DIAGNOSIS — N186 End stage renal disease: Secondary | ICD-10-CM

## 2017-03-04 DIAGNOSIS — Z79899 Other long term (current) drug therapy: Secondary | ICD-10-CM

## 2017-03-04 DIAGNOSIS — R319 Hematuria, unspecified: Secondary | ICD-10-CM | POA: Diagnosis present

## 2017-03-04 DIAGNOSIS — M4306 Spondylolysis, lumbar region: Secondary | ICD-10-CM

## 2017-03-04 DIAGNOSIS — L0291 Cutaneous abscess, unspecified: Secondary | ICD-10-CM

## 2017-03-04 DIAGNOSIS — Z7982 Long term (current) use of aspirin: Secondary | ICD-10-CM

## 2017-03-04 DIAGNOSIS — R9431 Abnormal electrocardiogram [ECG] [EKG]: Secondary | ICD-10-CM | POA: Diagnosis present

## 2017-03-04 DIAGNOSIS — N2581 Secondary hyperparathyroidism of renal origin: Secondary | ICD-10-CM | POA: Diagnosis present

## 2017-03-04 DIAGNOSIS — Z9114 Patient's other noncompliance with medication regimen: Secondary | ICD-10-CM

## 2017-03-04 DIAGNOSIS — K122 Cellulitis and abscess of mouth: Secondary | ICD-10-CM

## 2017-03-04 DIAGNOSIS — N269 Renal sclerosis, unspecified: Secondary | ICD-10-CM | POA: Diagnosis present

## 2017-03-04 DIAGNOSIS — M549 Dorsalgia, unspecified: Secondary | ICD-10-CM | POA: Diagnosis present

## 2017-03-04 DIAGNOSIS — Z992 Dependence on renal dialysis: Secondary | ICD-10-CM

## 2017-03-04 LAB — COMPREHENSIVE METABOLIC PANEL
ALBUMIN: 3.7 g/dL (ref 3.5–5.0)
ALK PHOS: 67 U/L (ref 38–126)
ALT: 11 U/L — AB (ref 17–63)
AST: 18 U/L (ref 15–41)
Anion gap: 12 (ref 5–15)
BUN: 28 mg/dL — AB (ref 6–20)
CALCIUM: 9.2 mg/dL (ref 8.9–10.3)
CHLORIDE: 97 mmol/L — AB (ref 101–111)
CO2: 28 mmol/L (ref 22–32)
CREATININE: 13.11 mg/dL — AB (ref 0.61–1.24)
GFR calc non Af Amer: 5 mL/min — ABNORMAL LOW (ref >60)
GFR, EST AFRICAN AMERICAN: 5 mL/min — AB (ref >60)
GLUCOSE: 93 mg/dL (ref 65–99)
Potassium: 4.5 mmol/L (ref 3.5–5.1)
SODIUM: 137 mmol/L (ref 135–145)
Total Bilirubin: 0.5 mg/dL (ref 0.3–1.2)
Total Protein: 7.7 g/dL (ref 6.5–8.1)

## 2017-03-04 LAB — CBC
HEMATOCRIT: 40.3 % (ref 39.0–52.0)
HEMOGLOBIN: 12.6 g/dL — AB (ref 13.0–17.0)
MCH: 27.9 pg (ref 26.0–34.0)
MCHC: 31.3 g/dL (ref 30.0–36.0)
MCV: 89.2 fL (ref 78.0–100.0)
PLATELETS: 265 10*3/uL (ref 150–400)
RBC: 4.52 MIL/uL (ref 4.22–5.81)
RDW: 17.8 % — ABNORMAL HIGH (ref 11.5–15.5)
WBC: 8.7 10*3/uL (ref 4.0–10.5)

## 2017-03-04 LAB — TROPONIN I: Troponin I: 0.05 ng/mL (ref <0.03)

## 2017-03-04 MED ORDER — ISOSORBIDE MONONITRATE ER 30 MG PO TB24
30.0000 mg | ORAL_TABLET | Freq: Every day | ORAL | Status: DC
  Administered 2017-03-04: 30 mg via ORAL
  Filled 2017-03-04: qty 1

## 2017-03-04 MED ORDER — CALCIUM CARBONATE ANTACID 1250 MG/5ML PO SUSP
500.0000 mg | Freq: Four times a day (QID) | ORAL | Status: DC | PRN
  Filled 2017-03-04: qty 5

## 2017-03-04 MED ORDER — DOCUSATE SODIUM 283 MG RE ENEM
1.0000 | ENEMA | RECTAL | Status: DC | PRN
  Filled 2017-03-04: qty 1

## 2017-03-04 MED ORDER — HYDRALAZINE HCL 25 MG PO TABS
100.0000 mg | ORAL_TABLET | Freq: Once | ORAL | Status: AC
  Administered 2017-03-04: 100 mg via ORAL
  Filled 2017-03-04: qty 4

## 2017-03-04 MED ORDER — CAMPHOR-MENTHOL 0.5-0.5 % EX LOTN: 1.0000 "application " | TOPICAL_LOTION | Freq: Three times a day (TID) | CUTANEOUS | Status: DC | PRN

## 2017-03-04 MED ORDER — ASPIRIN EC 81 MG PO TBEC
81.0000 mg | DELAYED_RELEASE_TABLET | Freq: Every day | ORAL | Status: DC
  Administered 2017-03-05 – 2017-03-07 (×3): 81 mg via ORAL
  Filled 2017-03-04 (×3): qty 1

## 2017-03-04 MED ORDER — CLONIDINE HCL 0.2 MG PO TABS
0.2000 mg | ORAL_TABLET | Freq: Once | ORAL | Status: AC
  Administered 2017-03-04: 0.2 mg via ORAL
  Filled 2017-03-04: qty 1

## 2017-03-04 MED ORDER — ONDANSETRON HCL 4 MG/2ML IJ SOLN: 4.0000 mg | Freq: Four times a day (QID) | INTRAMUSCULAR | Status: DC | PRN

## 2017-03-04 MED ORDER — SODIUM CHLORIDE 0.9% FLUSH
3.0000 mL | Freq: Two times a day (BID) | INTRAVENOUS | Status: DC
  Administered 2017-03-05 – 2017-03-06 (×5): 3 mL via INTRAVENOUS

## 2017-03-04 MED ORDER — OXYMETAZOLINE HCL 0.05 % NA SOLN: 1.0000 | Freq: Two times a day (BID) | NASAL | Status: DC | PRN

## 2017-03-04 MED ORDER — ENOXAPARIN SODIUM 30 MG/0.3ML ~~LOC~~ SOLN: 30.0000 mg | Freq: Every day | SUBCUTANEOUS | Status: DC

## 2017-03-04 MED ORDER — DEXAMETHASONE SODIUM PHOSPHATE 10 MG/ML IJ SOLN
10.0000 mg | Freq: Once | INTRAMUSCULAR | Status: AC
  Administered 2017-03-04: 10 mg via INTRAVENOUS
  Filled 2017-03-04: qty 1

## 2017-03-04 MED ORDER — RENA-VITE PO TABS
1.0000 | ORAL_TABLET | Freq: Every day | ORAL | Status: DC
  Administered 2017-03-05 – 2017-03-06 (×3): 1 via ORAL
  Filled 2017-03-04 (×3): qty 1

## 2017-03-04 MED ORDER — ACETAMINOPHEN 325 MG PO TABS: 650.0000 mg | ORAL_TABLET | Freq: Four times a day (QID) | ORAL | Status: DC | PRN

## 2017-03-04 MED ORDER — SORBITOL 70 % SOLN
30.0000 mL | Status: DC | PRN
Start: 2017-03-04 — End: 2017-03-07

## 2017-03-04 MED ORDER — HYDROMORPHONE HCL 1 MG/ML IJ SOLN
0.5000 mg | INTRAMUSCULAR | Status: DC | PRN
  Administered 2017-03-05 (×2): 0.5 mg via INTRAVENOUS
  Filled 2017-03-04: qty 1
  Filled 2017-03-04: qty 0.5

## 2017-03-04 MED ORDER — CINACALCET HCL 30 MG PO TABS
60.0000 mg | ORAL_TABLET | Freq: Every day | ORAL | Status: DC
  Administered 2017-03-05 – 2017-03-06 (×2): 60 mg via ORAL
  Filled 2017-03-04 (×2): qty 2

## 2017-03-04 MED ORDER — AMLODIPINE BESYLATE 10 MG PO TABS
10.0000 mg | ORAL_TABLET | Freq: Every day | ORAL | Status: DC
  Administered 2017-03-05 – 2017-03-07 (×3): 10 mg via ORAL
  Filled 2017-03-04 (×3): qty 1

## 2017-03-04 MED ORDER — ACETAMINOPHEN 325 MG PO TABS
650.0000 mg | ORAL_TABLET | Freq: Once | ORAL | Status: AC
  Administered 2017-03-04: 650 mg via ORAL
  Filled 2017-03-04: qty 2

## 2017-03-04 MED ORDER — NEPRO/CARBSTEADY PO LIQD
237.0000 mL | Freq: Two times a day (BID) | ORAL | Status: DC
  Filled 2017-03-04 (×8): qty 237

## 2017-03-04 MED ORDER — ACETAMINOPHEN 650 MG RE SUPP: 650.0000 mg | Freq: Four times a day (QID) | RECTAL | Status: DC | PRN

## 2017-03-04 MED ORDER — ONDANSETRON HCL 4 MG PO TABS: 4.0000 mg | ORAL_TABLET | Freq: Four times a day (QID) | ORAL | Status: DC | PRN

## 2017-03-04 MED ORDER — LABETALOL HCL 200 MG PO TABS
400.0000 mg | ORAL_TABLET | Freq: Once | ORAL | Status: AC
  Administered 2017-03-04: 400 mg via ORAL
  Filled 2017-03-04: qty 2

## 2017-03-04 MED ORDER — HYDRALAZINE HCL 25 MG PO TABS: 100.0000 mg | ORAL_TABLET | Freq: Three times a day (TID) | ORAL | Status: DC

## 2017-03-04 MED ORDER — LABETALOL HCL 200 MG PO TABS
400.0000 mg | ORAL_TABLET | Freq: Three times a day (TID) | ORAL | Status: DC
  Administered 2017-03-05 – 2017-03-07 (×8): 400 mg via ORAL
  Filled 2017-03-04 (×9): qty 2

## 2017-03-04 MED ORDER — CLONIDINE HCL 0.2 MG PO TABS
0.2000 mg | ORAL_TABLET | Freq: Three times a day (TID) | ORAL | Status: DC
  Administered 2017-03-05 – 2017-03-07 (×7): 0.2 mg via ORAL
  Filled 2017-03-04 (×9): qty 1

## 2017-03-04 MED ORDER — NEPRO/CARBSTEADY PO LIQD: 237.0000 mL | Freq: Three times a day (TID) | ORAL | Status: DC | PRN

## 2017-03-04 MED ORDER — HYDROXYZINE HCL 25 MG PO TABS: 25.0000 mg | ORAL_TABLET | Freq: Three times a day (TID) | ORAL | Status: DC | PRN

## 2017-03-04 MED ORDER — ZOLPIDEM TARTRATE 5 MG PO TABS
5.0000 mg | ORAL_TABLET | Freq: Every evening | ORAL | Status: DC | PRN
  Filled 2017-03-04: qty 1

## 2017-03-04 MED ORDER — SEVELAMER CARBONATE 800 MG PO TABS
800.0000 mg | ORAL_TABLET | Freq: Three times a day (TID) | ORAL | Status: DC
  Administered 2017-03-05 – 2017-03-07 (×5): 800 mg via ORAL
  Filled 2017-03-04 (×6): qty 1

## 2017-03-04 MED ORDER — ISOSORBIDE MONONITRATE ER 60 MG PO TB24
90.0000 mg | ORAL_TABLET | Freq: Every day | ORAL | Status: DC
  Administered 2017-03-05 – 2017-03-07 (×3): 90 mg via ORAL
  Filled 2017-03-04 (×4): qty 1

## 2017-03-04 MED ORDER — METHOCARBAMOL 1000 MG/10ML IJ SOLN
500.0000 mg | Freq: Four times a day (QID) | INTRAVENOUS | Status: DC | PRN
  Filled 2017-03-04: qty 5

## 2017-03-04 NOTE — ED Provider Notes (Signed)
[facility] Provider Note   CSN: [id number] Arrival date & time: 03/04/17  1514     History   Chief Complaint Chief Complaint  Patient presents with  . Weakness  . Cough  . Headache    HPI Jeremy Huerta is a 21 y.o. male presenting with 3-week history of generalized body aches, lightheadedness, and back pain.  Patient presents with a history of FSGS and hypertension on dialysis (Tu/Thu/Sa). Patient states that his symptoms began 3 weeks ago.  Nothing has made it better, nothing makes it worse. He reports generalized body aches, worse in the low back. Pain is worse with movement.  He has taken ibuprofen without relief. He was at dialysis today when he had worsening lightheadedness and aches, so dialysis was ended early.  He states that he often feels this way after dialysis, but it did not resolve as usual. Over the past 3 weeks, he reports associated productive cough with yellow sputum, which has been worsening. He has had hematuria and discomfort with urination. He does not normally produce urine, but has the past few days. Reports intermittent vomiting and nausea, none today. He denies fever, chills, chest pain, difficulty breathing, abdominal pain, or abnormal bowel movements. He denies leg pain or swelling. He denies history of asthma, COPD, or other lung problems.  He states he's been taking his medicines as prescribed. He states he does not smoke, drink alcohol, denies other drug use.  HPI  Past Medical History:  Diagnosis Date  . Acute respiratory failure (HCC)   . ESRD (end stage renal disease) (HCC)    TTS  . FSGS (focal segmental glomerulosclerosis)   . Hypertension   . Nausea & vomiting 11/2016    Patient Active Problem List   Diagnosis Date Noted  . Back pain 03/05/2017  . Hypertensive emergency 01/13/2017  . History of anemia due to chronic kidney disease 01/13/2017  . Secondary hyperparathyroidism (HCC) 01/13/2017  . Bleeding from the  nose 01/13/2017  . Fever 01/13/2017  . Chest pain on breathing   . SOB (shortness of breath)   . Gastroesophageal reflux disease   . Respiratory failure (HCC) 12/24/2016  . Acute pulmonary edema (HCC)   . End-stage renal disease on hemodialysis (HCC)   . Hypertensive urgency 12/02/2016  . Leukocytosis 12/02/2016  . Nausea & vomiting 12/02/2016  . Hyperkalemia 12/02/2016  . Acute respiratory failure (HCC) 04/22/2016  . Diarrhea 04/22/2016  . HCAP (healthcare-associated pneumonia) 04/22/2016  . Tachycardia 04/22/2016  . Hypertension     Past Surgical History:  Procedure Laterality Date  . AV FISTULA PLACEMENT  12/2015  . RENAL BIOPSY  2013       Home Medications    Prior to Admission medications   Medication Sig Start Date End Date Taking? Authorizing Provider  amLODipine (NORVASC) 10 MG tablet Take 10 mg by mouth daily.  07/15/14  Yes [provider]  aspirin EC 81 MG EC tablet Take 1 tablet (81 mg total) by mouth daily. 01/16/17  Yes Kathlen Mody, MD  B Complex-C-Folic Acid (B COMPLEX-VITAMIN C-FOLIC ACID) 1 MG tablet Take 1 tablet by mouth daily. 04/30/16  Yes [provider]  cinacalcet (SENSIPAR) 30 MG tablet Take 2 tablets (60 mg total) by mouth daily with supper. 12/04/16  Yes Rolly Salter, MD  cloNIDine (CATAPRES) 0.2 MG tablet Take 1 tablet (0.2 mg total) by mouth 3 (three) times daily. 12/27/16  Yes Drema Dallas, MD  hydrALAZINE (APRESOLINE) 100 MG  tablet Take 100 mg by mouth 3 (three) times daily.  04/13/16  Yes [provider]  isosorbide mononitrate (IMDUR) 30 MG 24 hr tablet Take 3 tablets (90 mg total) by mouth daily. 12/27/16  Yes Drema Dallas, MD  labetalol (NORMODYNE) 200 MG tablet Take 400 mg by mouth 3 (three) times daily. 03/16/16  Yes [provider]  Nutritional Supplements (FEEDING SUPPLEMENT, NEPRO CARB STEADY,) LIQD Take 237 mLs by mouth 2 (two) times daily between meals. 01/15/17  Yes Kathlen Mody, MD    ondansetron (ZOFRAN ODT) 4 MG disintegrating tablet Take 1 tablet (4 mg total) by mouth every 8 (eight) hours as needed for nausea or vomiting. 12/02/16  Yes Ward, Chase Picket, PA-C  oxymetazoline (AFRIN) 0.05 % nasal spray Place 1 spray into both nostrils 2 (two) times daily as needed (for nose bleed, soak cotton ball and hold with pressure with it in bleeding nare). 01/15/17  Yes Kathlen Mody, MD  sevelamer (RENAGEL) 800 MG tablet Take 2,400 mg by mouth 3 (three) times daily with meals.    Yes [provider]    Family History Family History  Problem Relation Age of Onset  . Hypertension Mother   . Diabetes Father   . Hypertension Father   . Kidney disease Father   . Hypertension Maternal Grandmother   . Diabetes Paternal Grandmother   . Hypertension Paternal Grandmother     Social History Social History  Substance Use Topics  . Smoking status: Never Smoker  . Smokeless tobacco: Never Used  . Alcohol use No     Allergies   Patient has no known allergies.   Review of Systems Review of Systems  Respiratory: Positive for cough.   Musculoskeletal: Positive for myalgias.  Neurological: Positive for light-headedness.  All other systems reviewed and are negative.    Physical Exam Updated Vital Signs BP (!) 177/102   Pulse 89   Temp 98.1 F (36.7 C) (Oral)   Resp 19   Ht 6' (1.829 m)   Wt 87.1 kg (192 lb 0.3 oz)   SpO2 96%   BMI 26.04 kg/m   Physical Exam  Constitutional: He is oriented to person, place, and time. He appears well-developed and well-nourished. No distress.  HENT:  Head: Normocephalic and atraumatic.  Nose: Mucosal edema present.  Mouth/Throat: Uvula is midline and oropharynx is clear and moist. Mucous membranes are dry.  Eyes: Pupils are equal, round, and reactive to light. Conjunctivae and EOM are normal.  Neck: Normal range of motion.  Cardiovascular: Normal rate, regular rhythm and intact distal pulses.   Pulmonary/Chest: Effort  normal. No respiratory distress. He has no decreased breath sounds. He has wheezes. He has no rhonchi. He has no rales.  Wheezing throughout all lung fields.   Abdominal: Soft. Bowel sounds are normal. He exhibits no distension. There is no tenderness. There is no rigidity, no rebound and no guarding.  Musculoskeletal: Normal range of motion.  Lymphadenopathy:    He has no cervical adenopathy.  Neurological: He is alert and oriented to person, place, and time.  Skin: Skin is warm and dry.  Psychiatric: He has a normal mood and affect.  Nursing note and vitals reviewed.    ED Treatments / Results  Labs (all labs ordered are listed, but only abnormal results are displayed) Labs Reviewed  MRSA PCR SCREENING - Abnormal; Notable for the following:       Result Value   MRSA by PCR POSITIVE (*)    All  other components within normal limits  CBC - Abnormal; Notable for the following:    Hemoglobin 12.6 (*)    RDW 17.8 (*)    All other components within normal limits  COMPREHENSIVE METABOLIC PANEL - Abnormal; Notable for the following:    Chloride 97 (*)    BUN 28 (*)    Creatinine, Ser 13.11 (*)    ALT 11 (*)    GFR calc non Af Amer 5 (*)    GFR calc Af Amer 5 (*)    All other components within normal limits  TROPONIN I - Abnormal; Notable for the following:    Troponin I 0.05 (*)    All other components within normal limits  BASIC METABOLIC PANEL - Abnormal; Notable for the following:    Sodium 134 (*)    Potassium 6.2 (*)    Chloride 96 (*)    BUN 40 (*)    Creatinine, Ser 14.89 (*)    Calcium 10.4 (*)    GFR calc non Af Amer 4 (*)    GFR calc Af Amer 5 (*)    All other components within normal limits  CBC - Abnormal; Notable for the following:    RDW 17.7 (*)    All other components within normal limits  TROPONIN I - Abnormal; Notable for the following:    Troponin I 0.05 (*)    All other components within normal limits  DRUG PROFILE, UR, 9 DRUGS ([facility])    EKG   EKG Interpretation  Date/Time:  Tuesday March 04 2017 22:24:59 EDT Ventricular Rate:  83 PR Interval:    QRS Duration: 102 QT Interval:  405 QTC Calculation: 476 R Axis:   80 Text Interpretation:  Sinus rhythm Probable left atrial enlargement Nonspecific T abnormalities, lateral leads ST elevation, consider anterior injury similar to previous tracing 1 hour ago Confirmed by Raeford Razor [phone]) on 03/05/2017 9:11:50 AM       Radiology Dg Chest 2 View  Result Date: 03/04/2017 CLINICAL DATA:  Wheezing EXAM: CHEST  2 VIEW COMPARISON:  01/14/2017 FINDINGS: Cardiac enlargement. Pulmonary vascular congestion without edema or effusion. Negative for infiltrate. IMPRESSION: Pulmonary vascular congestion without edema or effusion. Electronically Signed   By: Marlan Palau M.D.   On: 03/04/2017 16:38   Dg Lumbar Spine 2-3 Views  Result Date: 03/04/2017 CLINICAL DATA:  Low back pain without known injury. EXAM: LUMBAR SPINE - 2-3 VIEW COMPARISON:  None. FINDINGS: Normal lumbar segmentation. No lumbar fracture. Possible pars defect at L5 with minimal grade 1 retrolisthesis. Disc spaces are maintained. Mild physiologic anterior wedging of T10 and T11. IMPRESSION: Possible pars defect of L5 with minimal retrolisthesis. No acute fracture or significant disc space narrowing. Electronically Signed   By: Tollie Eth M.D.   On: 03/04/2017 23:01    Procedures Procedures (including critical care time)  Medications Ordered in ED Medications  aspirin EC tablet 81 mg (not administered)  feeding supplement (NEPRO CARB STEADY) liquid 237 mL (not administered)  oxymetazoline (AFRIN) 0.05 % nasal spray 1 spray (not administered)  cloNIDine (CATAPRES) tablet 0.2 mg (0.2 mg Oral Given 03/05/17 0647)  isosorbide mononitrate (IMDUR) 24 hr tablet 90 mg (not administered)  cinacalcet (SENSIPAR) tablet 60 mg (not administered)  sevelamer carbonate (RENVELA) tablet 800 mg (800 mg Oral Given 03/05/17 0759)    multivitamin (RENA-VIT) tablet 1 tablet (1 tablet Oral Given 03/05/17 0337)  labetalol (NORMODYNE) tablet 400 mg (400 mg Oral Given 03/05/17 0647)  amLODipine (NORVASC) tablet 10 mg (  not administered)  ondansetron (ZOFRAN) tablet 4 mg (not administered)    Or  ondansetron (ZOFRAN) injection 4 mg (not administered)  zolpidem (AMBIEN) tablet 5 mg (not administered)  sorbitol 70 % solution 30 mL (not administered)  docusate sodium (ENEMEEZ) enema 283 mg (not administered)  camphor-menthol (SARNA) lotion 1 application (not administered)    And  hydrOXYzine (ATARAX/VISTARIL) tablet 25 mg (not administered)  calcium carbonate (dosed in mg elemental calcium) suspension 500 mg of elemental calcium (not administered)  enoxaparin (LOVENOX) injection 30 mg (not administered)  sodium chloride flush (NS) 0.9 % injection 3 mL (3 mLs Intravenous Given 03/05/17 0337)  methocarbamol (ROBAXIN) 500 mg in dextrose 5 % 50 mL IVPB (not administered)  hydrALAZINE (APRESOLINE) tablet 100 mg (100 mg Oral Given 03/05/17 0647)  acetaminophen (TYLENOL) tablet 1,000 mg (not administered)  pentafluoroprop-tetrafluoroeth (GEBAUERS) aerosol 1 application (not administered)  lidocaine (PF) (XYLOCAINE) 1 % injection 5 mL (not administered)  lidocaine-prilocaine (EMLA) cream 1 application (not administered)  0.9 %  sodium chloride infusion (not administered)  0.9 %  sodium chloride infusion (not administered)  heparin injection 1,000 Units (not administered)  alteplase (CATHFLO ACTIVASE) injection 2 mg (not administered)  heparin injection 4,600 Units (not administered)  doxercalciferol (HECTOROL) injection 7 mcg (not administered)  acetaminophen (TYLENOL) tablet 650 mg (650 mg Oral Given 03/04/17 1814)  hydrALAZINE (APRESOLINE) tablet 100 mg (100 mg Oral Given 03/04/17 1815)  labetalol (NORMODYNE) tablet 400 mg (400 mg Oral Given 03/04/17 1814)  cloNIDine (CATAPRES) tablet 0.2 mg (0.2 mg Oral Given 03/04/17 1815)   dexamethasone (DECADRON) injection 10 mg (10 mg Intravenous Given 03/04/17 1940)     Initial Impression / Assessment and Plan / ED Course  I have reviewed the triage vital signs and the nursing notes.  Pertinent labs & imaging results that were available during my care of the patient were reviewed by me and considered in my medical decision making (see chart for details).     Patient presenting with several week history of weakness, generalized muscle aches, and feeling poorly. This worsened today after dialysis. Physical exam shows patient is very hypertensive, and has scattered wheezes throughout his lungs. No history of lung problems. Will order basic labs, chest x-ray, EKG, and troponin. Tylenol for HA.   Chest x-ray shows no obvious infiltrate. Basic labs reassuring. Troponin improved from last visit at 0.05 (Baseline is 0.07). EKG shows T wave changes. Case discussed with attending, and Dr. Rubin Payor agrees to plan. As it is time for pt's at home medications, will give at home medications and reassess BP. Wheezes and cough likely due to viral illness/bronchitis. Will give decadron for sx control. ?need for abx after dc due to comorbidities.   On reassessment, pt told RN that he is out of his imdur and amlodipine, but has been taking his other BP medications. BP slightly improved to 180s/120s. Repeat EKG shows persistence of T waves changes without progression towards STEMI. UA cannot be obtained, as pt cannot produce urine currently. Will consult with hospitalist regarding admission.  Case discussed with Dr. Ophelia Charter, and pt to be admitted.    1900: Pulse ox was off the finger when recorded at 89%. When placed back on the finger, SpO2 returned to upper 90s.   Final Clinical Impressions(s) / ED Diagnoses   Final diagnoses:  Hypertensive urgency    New Prescriptions Current Discharge Medication List       Alveria Apley, Cordelia Poche 03/05/17 1219    Benjiman Core,  MD 03/12/17 [phone]

## 2017-03-04 NOTE — ED Notes (Signed)
Pt reports he needs new prescription for Imdur and Norvasc. Out of meds.

## 2017-03-04 NOTE — H&P (Signed)
History and Physical    Jeremy Huerta ZDG:[id number] DOB: 1995-07-04 DOA: 03/04/2017  PCP: [name], MD Consultants:  Nephrology Patient coming from:  Home - lives with roommate; [facility]: Mother, [phone]  Chief Complaint: Back pain, headache  HPI: Jeremy Huerta is a 21 y.o. 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  Symptoms started 1-2 weeks ago.  Head is improving but his back is still bothering him.  Denies injury.  Lower back pain.  BP is "always misbehaving".  His back was bothering him so much that he had to come in.  It has not been imaged.  He has never had imaging of his back.  No chest pain.  No radicular symptoms.  No fecal incontinence.  He has had generalized body aches with lightheadedness, as well. He attends [facility] and completed his session today.   ED Course:  Unremarkable labs.  Markedly elevated BP with only slight improvement with home meds.  ?EKG changes vs. increased prominence of prior changes.  Concern for HTN Urgency.  Review of Systems: As per HPI; otherwise review of systems reviewed and negative.   Ambulatory Status:  Ambulates without assistance  Past Medical History:  Diagnosis Date  . Acute respiratory failure (HCC)   . ESRD (end stage renal disease) (HCC)    TTS  . FSGS (focal segmental glomerulosclerosis)   . Hypertension   . Nausea & vomiting 11/2016    Past Surgical History:  Procedure Laterality Date  . AV FISTULA PLACEMENT  12/2015  . RENAL BIOPSY  2013    Social History   Social History  . Marital status: Single    Spouse name: N/A  . Number of children: N/A  . Years of education: N/A   Occupational History  . [employer] employee    Social History Main Topics  . Smoking status: Never Smoker  . Smokeless tobacco: Never Used  . Alcohol use No  . Drug use: No  . Sexual activity: Not on file   Other Topics Concern  . Not on file   Social  History Narrative  . No narrative on file    No Known Allergies  Family History  Problem Relation Age of Onset  . Hypertension Mother   . Diabetes Father   . Hypertension Father   . Kidney disease Father   . Hypertension Maternal Grandmother   . Diabetes Paternal Grandmother   . Hypertension Paternal Grandmother     Prior to Admission medications   Medication Sig Start Date End Date Taking? Authorizing Provider  amLODipine (NORVASC) 10 MG tablet Take 10 mg by mouth daily.  07/15/14  Yes [provider]  aspirin EC 81 MG EC tablet Take 1 tablet (81 mg total) by mouth daily. 01/16/17  Yes Kathlen Mody, MD  B Complex-C-Folic Acid (B COMPLEX-VITAMIN C-FOLIC ACID) 1 MG tablet Take 1 tablet by mouth daily. 04/30/16  Yes [provider]  cinacalcet (SENSIPAR) 30 MG tablet Take 2 tablets (60 mg total) by mouth daily with supper. 12/04/16  Yes Rolly Salter, MD  cloNIDine (CATAPRES) 0.2 MG tablet Take 1 tablet (0.2 mg total) by mouth 3 (three) times daily. 12/27/16  Yes Drema Dallas, MD  hydrALAZINE (APRESOLINE) 100 MG tablet Take 100 mg by mouth 3 (three) times daily.  04/13/16  Yes [provider]  isosorbide mononitrate (IMDUR) 30 MG 24 hr tablet Take 3 tablets (90 mg total) by mouth daily.  12/27/16  Yes Drema Dallas, MD  labetalol (NORMODYNE) 200 MG tablet Take 400 mg by mouth 3 (three) times daily. 03/16/16  Yes [provider]  Nutritional Supplements (FEEDING SUPPLEMENT, NEPRO CARB STEADY,) LIQD Take 237 mLs by mouth 2 (two) times daily between meals. 01/15/17  Yes Kathlen Mody, MD  ondansetron (ZOFRAN ODT) 4 MG disintegrating tablet Take 1 tablet (4 mg total) by mouth every 8 (eight) hours as needed for nausea or vomiting. 12/02/16  Yes Ward, Chase Picket, PA-C  oxymetazoline (AFRIN) 0.05 % nasal spray Place 1 spray into both nostrils 2 (two) times daily as needed (for nose bleed, soak cotton ball and hold with pressure with it in bleeding nare).  01/15/17  Yes Kathlen Mody, MD  sevelamer (RENAGEL) 800 MG tablet Take 2,400 mg by mouth 3 (three) times daily with meals.    Yes [provider]  doxercalciferol (HECTOROL) 4 MCG/2ML injection Inject 3.5 mLs (7 mcg total) into the vein Every Tuesday,Thursday,and Saturday with dialysis. Patient not taking: Reported on 03/04/2017 01/16/17   Kathlen Mody, MD    Physical Exam: Vitals:   03/04/17 2230 03/04/17 2300 03/05/17 0000 03/05/17 0030  BP: (!) 192/138 (!) 188/137 (!) 201/137 (!) 192/129  Pulse: 82 79 82 87  Resp: 15 15 (!) 22 (!) 23  Temp:      TempSrc:      SpO2: 98% 97% 98% 99%     General: Appears very uncomfortable with significant back pain; he was not given pain medications in the ER Eyes:  PERRL, EOMI, normal lids, iris ENT:  grossly normal hearing, lips & tongue, mmm; appropriate dentition Neck:  no LAD, masses or thyromegaly Cardiovascular:  RRR, no m/r/g. No LE edema.  Respiratory:   CTA bilaterally with no wheezes/rales/rhonchi.  Normal respiratory effort. Abdomen:  soft, NT, ND, NABS Back:   normal alignment, no CVAT; he has TTP along the spine itself as well as along the paraspinous muscles in the lumber region Skin:  no rash or induration seen on limited exam Musculoskeletal:  grossly normal tone BUE/BLE, good ROM, no bony abnormality Psychiatric:  Irritable mood and affect, speech fluent and appropriate, AOx3 Neurologic:  CN 2-12 grossly intact, moves all extremities in coordinated fashion, sensation intact    Radiological Exams on Admission: Dg Chest 2 View  Result Date: 03/04/2017 CLINICAL DATA:  Wheezing EXAM: CHEST  2 VIEW COMPARISON:  01/14/2017 FINDINGS: Cardiac enlargement. Pulmonary vascular congestion without edema or effusion. Negative for infiltrate. IMPRESSION: Pulmonary vascular congestion without edema or effusion. Electronically Signed   By: Marlan Palau M.D.   On: 03/04/2017 16:38   Dg Lumbar Spine 2-3 Views  Result Date:  03/04/2017 CLINICAL DATA:  Low back pain without known injury. EXAM: LUMBAR SPINE - 2-3 VIEW COMPARISON:  None. FINDINGS: Normal lumbar segmentation. No lumbar fracture. Possible pars defect at L5 with minimal grade 1 retrolisthesis. Disc spaces are maintained. Mild physiologic anterior wedging of T10 and T11. IMPRESSION: Possible pars defect of L5 with minimal retrolisthesis. No acute fracture or significant disc space narrowing. Electronically Signed   By: Tollie Eth M.D.   On: 03/04/2017 23:01    EKG: Independently reviewed.  NSR with rate 72; nonspecific ST changes in the anterior leads with no evidence of acute ischemia, stable across 2 EKGs without progression   Labs on Admission: I have personally reviewed the available labs and imaging studies at the time of the admission.  Pertinent labs:   Troponin 0.05 x 2 - prior  0.07 in 8/18 BUN 28/Creatinine 13.11/GFR 5 - stable WBC 8.7 Hgb 12.6 - improved  Assessment/Plan Principal Problem:   Hypertensive urgency Active Problems:   End-stage renal disease on hemodialysis (HCC)   Back pain   HTN Urgency -Hypertensive crisis with Abnormal EKG -Patient with long-standing poor HTN control with h/o medication non-compliance and recurrent need for admission for HTN crisis but without chest pain or SOB currently  -He does have headache but does not have other obvious concern for target organ damage -The initial goal of therapy would generally be to decrease the MAP by no more than 25% in the first hour and then continue to decrease the BP additionally within the next 2-6 hours -The patient received hydralazine 100 mg PO; Catapres 0.2 mg PO; and Labetalol 400 mg PO - his home medications -He did not receive IV medications in the ER and his BP began to improve  -He was given Tylenol and Decadron for pain -The underlying cause for today's presentation may be related to his back pain; may be cardiac in nature; or may be associated with chronic  suboptimal HTN control -Based on his BP control no longer in a dangerous range, will observe patient on telemetry rather than placing in ICU -Will cycle troponin (negative x 2); request Echo in AM given concern for new/worsening anterior ST changes; and consider cardiology consultation based on the results -He takes very large doses of multiple BP medications - will continue home meds for now without prn medication since he appears to already be improving  Back pain -Uncertain etiology - denies injury -LS spine film shows possible pars defect at L5, which may be the source of his pain -Will treat with Robaxin and Dilaudid -PT consult in AM  ESRD on HD -Patient on chronic TTS HD -Nephrology prn order set utilized -He does not appear to be volume overloaded or otherwise in need of acute HD -He was dialyzed today and so will not need dialysis again until Thursday; it is possible that he will be discharged prior  DVT prophylaxis: Lovenox Code Status:  Full - confirmed with patient Family Communication: None present Disposition Plan:  Home once clinically improved Consults called: PT  Admission status: It is my clinical opinion that referral for OBSERVATION is reasonable and necessary in this patient based on the above information provided. The aforementioned taken together are felt to place the patient at high risk for further clinical deterioration. However it is anticipated that the patient may be medically stable for discharge from the hospital within 24 to 48 hours.    Jonah Blue MD [facility]  If note is complete, please contact covering daytime or nighttime physician. [email or web address] Password [email or web address]  03/05/2017, 12:58 AM

## 2017-03-04 NOTE — ED Notes (Signed)
Pt advised he does not make urine.

## 2017-03-04 NOTE — ED Triage Notes (Signed)
To room via EMS.  Pt had dialysis today except last 20 minutes.  Pt does not make any urine, onset 3 days has passed few drops bloody urine.  Onset 1 week body aches and productive cough, yellow phlegm. Onset yesterday headache.  Onset today dizziness.

## 2017-03-04 NOTE — ED Notes (Signed)
Pt declined to have I/O catheter done.

## 2017-03-05 ENCOUNTER — Other Ambulatory Visit (HOSPITAL_COMMUNITY): Payer: Medicaid Other

## 2017-03-05 ENCOUNTER — Encounter (HOSPITAL_COMMUNITY): Payer: Self-pay | Admitting: *Deleted

## 2017-03-05 DIAGNOSIS — I160 Hypertensive urgency: Principal | ICD-10-CM

## 2017-03-05 DIAGNOSIS — L02811 Cutaneous abscess of head [any part, except face]: Secondary | ICD-10-CM | POA: Diagnosis not present

## 2017-03-05 DIAGNOSIS — K219 Gastro-esophageal reflux disease without esophagitis: Secondary | ICD-10-CM | POA: Diagnosis present

## 2017-03-05 DIAGNOSIS — R590 Localized enlarged lymph nodes: Secondary | ICD-10-CM | POA: Diagnosis not present

## 2017-03-05 DIAGNOSIS — N186 End stage renal disease: Secondary | ICD-10-CM

## 2017-03-05 DIAGNOSIS — M4306 Spondylolysis, lumbar region: Secondary | ICD-10-CM | POA: Diagnosis not present

## 2017-03-05 DIAGNOSIS — D649 Anemia, unspecified: Secondary | ICD-10-CM | POA: Diagnosis present

## 2017-03-05 DIAGNOSIS — Z9114 Patient's other noncompliance with medication regimen: Secondary | ICD-10-CM | POA: Diagnosis not present

## 2017-03-05 DIAGNOSIS — R9431 Abnormal electrocardiogram [ECG] [EKG]: Secondary | ICD-10-CM | POA: Diagnosis present

## 2017-03-05 DIAGNOSIS — Z992 Dependence on renal dialysis: Secondary | ICD-10-CM | POA: Diagnosis not present

## 2017-03-05 DIAGNOSIS — I1311 Hypertensive heart and chronic kidney disease without heart failure, with stage 5 chronic kidney disease, or end stage renal disease: Secondary | ICD-10-CM | POA: Diagnosis present

## 2017-03-05 DIAGNOSIS — N269 Renal sclerosis, unspecified: Secondary | ICD-10-CM | POA: Diagnosis present

## 2017-03-05 DIAGNOSIS — N2581 Secondary hyperparathyroidism of renal origin: Secondary | ICD-10-CM | POA: Diagnosis present

## 2017-03-05 DIAGNOSIS — M549 Dorsalgia, unspecified: Secondary | ICD-10-CM | POA: Diagnosis present

## 2017-03-05 DIAGNOSIS — Z79899 Other long term (current) drug therapy: Secondary | ICD-10-CM | POA: Diagnosis not present

## 2017-03-05 DIAGNOSIS — M545 Low back pain: Secondary | ICD-10-CM

## 2017-03-05 DIAGNOSIS — Z7982 Long term (current) use of aspirin: Secondary | ICD-10-CM | POA: Diagnosis not present

## 2017-03-05 DIAGNOSIS — R319 Hematuria, unspecified: Secondary | ICD-10-CM | POA: Diagnosis present

## 2017-03-05 DIAGNOSIS — L0292 Furuncle, unspecified: Secondary | ICD-10-CM | POA: Diagnosis present

## 2017-03-05 LAB — CBC
HEMATOCRIT: 42.7 % (ref 39.0–52.0)
Hemoglobin: 13.5 g/dL (ref 13.0–17.0)
MCH: 28.1 pg (ref 26.0–34.0)
MCHC: 31.6 g/dL (ref 30.0–36.0)
MCV: 89 fL (ref 78.0–100.0)
PLATELETS: 343 10*3/uL (ref 150–400)
RBC: 4.8 MIL/uL (ref 4.22–5.81)
RDW: 17.7 % — AB (ref 11.5–15.5)
WBC: 9.4 10*3/uL (ref 4.0–10.5)

## 2017-03-05 LAB — BASIC METABOLIC PANEL
Anion gap: 15 (ref 5–15)
BUN: 40 mg/dL — AB (ref 6–20)
CALCIUM: 10.4 mg/dL — AB (ref 8.9–10.3)
CO2: 23 mmol/L (ref 22–32)
CREATININE: 14.89 mg/dL — AB (ref 0.61–1.24)
Chloride: 96 mmol/L — ABNORMAL LOW (ref 101–111)
GFR calc Af Amer: 5 mL/min — ABNORMAL LOW (ref >60)
GFR, EST NON AFRICAN AMERICAN: 4 mL/min — AB (ref >60)
GLUCOSE: 94 mg/dL (ref 65–99)
POTASSIUM: 6.2 mmol/L — AB (ref 3.5–5.1)
SODIUM: 134 mmol/L — AB (ref 135–145)

## 2017-03-05 LAB — MRSA PCR SCREENING: MRSA BY PCR: POSITIVE — AB

## 2017-03-05 LAB — POTASSIUM: POTASSIUM: 4.7 mmol/L (ref 3.5–5.1)

## 2017-03-05 LAB — TROPONIN I: TROPONIN I: 0.05 ng/mL — AB (ref <0.03)

## 2017-03-05 MED ORDER — METHOCARBAMOL 500 MG PO TABS: 500.0000 mg | ORAL_TABLET | Freq: Three times a day (TID) | ORAL | 0 refills | Status: DC | PRN

## 2017-03-05 MED ORDER — HEPARIN SODIUM (PORCINE) 5000 UNIT/ML IJ SOLN
5000.0000 [IU] | Freq: Three times a day (TID) | INTRAMUSCULAR | Status: DC
  Administered 2017-03-07: 5000 [IU] via SUBCUTANEOUS
  Filled 2017-03-05 (×3): qty 1

## 2017-03-05 MED ORDER — SODIUM CHLORIDE 0.9 % IV SOLN: 100.0000 mL | INTRAVENOUS | Status: DC | PRN

## 2017-03-05 MED ORDER — HEPARIN SODIUM (PORCINE) 1000 UNIT/ML DIALYSIS: 1000.0000 [IU] | INTRAMUSCULAR | Status: DC | PRN

## 2017-03-05 MED ORDER — HYDROCODONE-ACETAMINOPHEN 5-325 MG PO TABS
1.0000 | ORAL_TABLET | Freq: Four times a day (QID) | ORAL | Status: DC | PRN
  Administered 2017-03-05 – 2017-03-07 (×4): 2 via ORAL
  Filled 2017-03-05 (×3): qty 2

## 2017-03-05 MED ORDER — MUPIROCIN 2 % EX OINT
TOPICAL_OINTMENT | CUTANEOUS | Status: AC
  Administered 2017-03-05: 1
  Filled 2017-03-05: qty 22

## 2017-03-05 MED ORDER — DOXERCALCIFEROL 4 MCG/2ML IV SOLN: 7.0000 ug | INTRAVENOUS | Status: DC

## 2017-03-05 MED ORDER — METHOCARBAMOL 500 MG PO TABS
500.0000 mg | ORAL_TABLET | Freq: Four times a day (QID) | ORAL | Status: DC | PRN
  Administered 2017-03-05 – 2017-03-06 (×2): 500 mg via ORAL
  Filled 2017-03-05 (×2): qty 1

## 2017-03-05 MED ORDER — ACETAMINOPHEN 500 MG PO TABS
1000.0000 mg | ORAL_TABLET | Freq: Three times a day (TID) | ORAL | Status: DC | PRN
  Administered 2017-03-05 – 2017-03-07 (×3): 1000 mg via ORAL
  Filled 2017-03-05 (×3): qty 2

## 2017-03-05 MED ORDER — LIDOCAINE HCL (PF) 1 % IJ SOLN: 5.0000 mL | INTRAMUSCULAR | Status: DC | PRN

## 2017-03-05 MED ORDER — HYDRALAZINE HCL 50 MG PO TABS
100.0000 mg | ORAL_TABLET | Freq: Three times a day (TID) | ORAL | Status: DC
  Administered 2017-03-05 – 2017-03-07 (×8): 100 mg via ORAL
  Filled 2017-03-05: qty 2
  Filled 2017-03-05: qty 4
  Filled 2017-03-05 (×7): qty 2
  Filled 2017-03-05: qty 4

## 2017-03-05 MED ORDER — ALTEPLASE 2 MG IJ SOLR: 2.0000 mg | Freq: Once | INTRAMUSCULAR | Status: DC | PRN

## 2017-03-05 MED ORDER — HYDRALAZINE HCL 25 MG PO TABS: 100.0000 mg | ORAL_TABLET | Freq: Three times a day (TID) | ORAL | Status: DC

## 2017-03-05 MED ORDER — LIDOCAINE-PRILOCAINE 2.5-2.5 % EX CREA: 1.0000 "application " | TOPICAL_CREAM | CUTANEOUS | Status: DC | PRN

## 2017-03-05 MED ORDER — PENTAFLUOROPROP-TETRAFLUOROETH EX AERO: 1.0000 "application " | INHALATION_SPRAY | CUTANEOUS | Status: DC | PRN

## 2017-03-05 MED ORDER — HEPARIN SODIUM (PORCINE) 1000 UNIT/ML DIALYSIS
4600.0000 [IU] | INTRAMUSCULAR | Status: DC | PRN
  Filled 2017-03-05: qty 5

## 2017-03-05 NOTE — Discharge Summary (Addendum)
Physician Discharge Summary  Jeremy Huerta ZOX:[id number] DOB: 04/03/1996 DOA: 03/04/2017  PCP: [name], MD  Admit date: 03/04/2017 Discharge date: 03/07/2017  Admitted From: Home Disposition: Home  Recommendations for Outpatient Follow-up:  1. Follow up with PCP in 1 week 2. Please obtain BMP/CBC in one week 3. Please follow up on the following pending results: None  Home Health: None Equipment/Devices: None  Discharge Condition: Stable CODE STATUS: Full code Diet recommendation: Renal diet   Brief/Interim Summary:  Admission HPI written by Jonah Blue, MD   Chief Complaint: Back pain, headache  HPI: Jeremy Huerta is a 21 y.o. 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  Symptoms started 1-2 weeks ago.  Head is improving but his back is still bothering him.  Denies injury.  Lower back pain.  BP is "always misbehaving".  His back was bothering him so much that he had to come in.  It has not been imaged.  He has never had imaging of his back.  No chest pain.  No radicular symptoms.  No fecal incontinence.  He has had generalized body aches with lightheadedness, as well. He attends [facility] and completed his session today.   ED Course:  Unremarkable labs.  Markedly elevated BP with only slight improvement with home meds.  ?EKG changes vs. increased prominence of prior changes.  Concern for HTN Urgency.   Hospital course:  Hypertensive urgency Resolved with home antihypertensive medications. Continued home Labetalol, clonidine, Imdur, amlodipine  Back pain Acute. No red flags. Worsened and limiting mobility. Likely secondary to pars defect and associated back strain. PT recommending outpatient physical therapy. Robaxin and Norco on discharge.  Pars defect Outpatient physical therapy.  T-wave abnormalities Troponin with flat trend. No chest pain. Repeat EKG unchanged. Consulted  cardiology who did not recommend stress test. Echocardiogram significant for an EF of 60-65%.  Skin abess Likely secondary to shaving/haircut. Draining. Continue Augmentin as an outpatient. Follow-up for resolution of abscess. Warm compresses.  Discharge Diagnoses:  Principal Problem:   Hypertensive urgency Active Problems:   End-stage renal disease on hemodialysis (HCC)   Back pain   Pars defect of lumbar spine   Skin abscess    Discharge Instructions  Discharge Instructions    Increase activity slowly    Complete by:  As directed      Allergies as of 03/07/2017   No Known Allergies     Medication List    TAKE these medications   amLODipine 10 MG tablet Commonly known as:  NORVASC Take 10 mg by mouth daily.   amoxicillin-clavulanate 500-125 MG tablet Commonly known as:  AUGMENTIN Take 1 tablet (500 mg total) by mouth at bedtime.   aspirin 81 MG EC tablet Take 1 tablet (81 mg total) by mouth daily.   B complex-vitamin C-folic acid 1 MG tablet Take 1 tablet by mouth daily.   cinacalcet 30 MG tablet Commonly known as:  SENSIPAR Take 2 tablets (60 mg total) by mouth daily with supper.   cloNIDine 0.2 MG tablet Commonly known as:  CATAPRES Take 1 tablet (0.2 mg total) by mouth 3 (three) times daily.   feeding supplement (NEPRO CARB STEADY) Liqd Take 237 mLs by mouth 2 (two) times daily between meals.   hydrALAZINE 100 MG tablet Commonly known as:  APRESOLINE Take 100 mg by mouth 3 (three) times daily.   HYDROcodone-acetaminophen 5-325 MG tablet Commonly known as:  NORCO/VICODIN Take 1-2 tablets  by mouth every 6 (six) hours as needed for severe pain.   isosorbide mononitrate 30 MG 24 hr tablet Commonly known as:  IMDUR Take 3 tablets (90 mg total) by mouth daily.   labetalol 200 MG tablet Commonly known as:  NORMODYNE Take 400 mg by mouth 3 (three) times daily.   methocarbamol 500 MG tablet Commonly known as:  ROBAXIN Take 1 tablet (500 mg total) by  mouth every 8 (eight) hours as needed for muscle spasms.   ondansetron 4 MG disintegrating tablet Commonly known as:  ZOFRAN ODT Take 1 tablet (4 mg total) by mouth every 8 (eight) hours as needed for nausea or vomiting.   oxymetazoline 0.05 % nasal spray Commonly known as:  AFRIN Place 1 spray into both nostrils 2 (two) times daily as needed (for nose bleed, soak cotton ball and hold with pressure with it in bleeding nare).   sevelamer 800 MG tablet Commonly known as:  RENAGEL Take 2,400 mg by mouth 3 (three) times daily with meals.      Follow-up Information    [name], MD. Schedule an appointment as soon as possible for a visit in 1 week(s).   Specialty:  Nephrology Contact information: [address] [place] [address] [phone]        [facility]. Schedule an appointment as soon as possible for a visit.   Why:  Blood in urine Contact information: [address] [place] [address] [phone]         No Known Allergies  Consultations:  Cardiology   Procedures/Studies: Dg Chest 2 View  Result Date: 03/04/2017 CLINICAL DATA:  Wheezing EXAM: CHEST  2 VIEW COMPARISON:  01/14/2017 FINDINGS: Cardiac enlargement. Pulmonary vascular congestion without edema or effusion. Negative for infiltrate. IMPRESSION: Pulmonary vascular congestion without edema or effusion. Electronically Signed   By: Marlan Palau M.D.   On: 03/04/2017 16:38   Dg Lumbar Spine 2-3 Views  Result Date: 03/04/2017 CLINICAL DATA:  Low back pain without known injury. EXAM: LUMBAR SPINE - 2-3 VIEW COMPARISON:  None. FINDINGS: Normal lumbar segmentation. No lumbar fracture. Possible pars defect at L5 with minimal grade 1 retrolisthesis. Disc spaces are maintained. Mild physiologic anterior wedging of T10 and T11. IMPRESSION: Possible pars defect of L5 with minimal retrolisthesis. No acute fracture or significant disc space narrowing.  Electronically Signed   By: Tollie Eth M.D.   On: 03/04/2017 23:01   US Soft Tissue Neck  Result Date: 03/06/2017 CLINICAL DATA:  Submandibular abscess. Neck swelling and pain for 2 days. EXAM: ULTRASOUND OF HEAD/NECK SOFT TISSUES TECHNIQUE: Ultrasound examination of the head and neck soft tissues was performed in the area of clinical concern. COMPARISON:  None. FINDINGS: The midline of the neck was evaluated with ultrasound. There is a hypoechoic structure at the area of concern most compatible with a lymph node measuring 1.2 x 0.5 x 1.3 cm. The soft tissue anterior and superior to this lymph node appears to be edematous. IMPRESSION: Edematous tissue at the area of concern with a mildly prominent lymph node. No discrete fluid or abscess collection identified. Electronically Signed   By: Richarda Overlie M.D.   On: 03/06/2017 11:04    Echocardiogram (03/07/17)  Study Conclusions  - Left ventricle: The cavity size was normal. Wall thickness was   increased in a pattern of moderate LVH. Systolic function was   normal. The estimated ejection fraction was in the range of 60%   to 65%.  Wall motion was normal; there were no regional wall   motion abnormalities. - Left atrium: The atrium was moderately dilated.   Subjective: Submandibular pain. Back pain improved with Robaxin.  Discharge Exam: Vitals:   03/06/17 2115 03/07/17 0512  BP: (!) 175/89 (!) 168/89  Pulse: 89 90  Resp: 18 20  Temp: 99.1 F (37.3 C) 98.9 F (37.2 C)  SpO2: 97% 98%   Vitals:   03/06/17 1530 03/06/17 1600 03/06/17 2115 03/07/17 0512  BP: (!) 173/84 (!) 172/89 (!) 175/89 (!) 168/89  Pulse: 87 84 89 90  Resp: 18 18 18 20   Temp:   99.1 F (37.3 C) 98.9 F (37.2 C)  TempSrc:   Oral Oral  SpO2: 100% 100% 97% 98%  Weight:      Height:        General: Pt is alert, awake, not in acute distress HEENT: submandibular mass with induration and no fluctuance and associated tenderness. No erythema. No cervical  lymphadenopathy. No oral lesions Cardiovascular: RRR, S1/S2 +, no rubs, no gallops Respiratory: CTA bilaterally, no wheezing, no rhonchi Abdominal: Soft, NT, ND, bowel sounds + Extremities: no edema, no cyanosis    The results of significant diagnostics from this hospitalization (including imaging, microbiology, ancillary and laboratory) are listed below for reference.     Microbiology: Recent Results (from the past 240 hour(s))  MRSA PCR Screening     Status: Abnormal   Collection Time: 03/05/17  3:14 AM  Result Value Ref Range Status   MRSA by PCR POSITIVE (A) NEGATIVE Final    Comment:        The GeneXpert MRSA Assay (FDA approved for NASAL specimens only), is one component of a comprehensive MRSA colonization surveillance program. It is not intended to diagnose MRSA infection nor to guide or monitor treatment for MRSA infections. RESULT CALLED TO, READ BACK BY AND VERIFIED WITH: C.MARSHALL RN 03/05/17 0628 L.CHAMPION      Labs: BNP (last 3 results)  Recent Labs  01/13/17 1358  BNP 2,858.3*   Basic Metabolic Panel:  Recent Labs Lab 03/04/17 1603 03/05/17 0715 03/05/17 1907  NA 137 134*  --   K 4.5 6.2* 4.7  CL 97* 96*  --   CO2 28 23  --   GLUCOSE 93 94  --   BUN 28* 40*  --   CREATININE 13.11* 14.89*  --   CALCIUM 9.2 10.4*  --    Liver Function Tests:  Recent Labs Lab 03/04/17 1603  AST 18  ALT 11*  ALKPHOS 67  BILITOT 0.5  PROT 7.7  ALBUMIN 3.7   No results for input(s): LIPASE, AMYLASE in the last 168 hours. No results for input(s): AMMONIA in the last 168 hours. CBC:  Recent Labs Lab 03/04/17 1603 03/05/17 0715  WBC 8.7 9.4  HGB 12.6* 13.5  HCT 40.3 42.7  MCV 89.2 89.0  PLT 265 343   Cardiac Enzymes:  Recent Labs Lab 03/04/17 1603 03/04/17 2326  TROPONINI 0.05* 0.05*   BNP: Invalid input(s): POCBNP CBG: No results for input(s): GLUCAP in the last 168 hours. D-Dimer No results for input(s): DDIMER in the last 72  hours. Hgb A1c No results for input(s): HGBA1C in the last 72 hours. Lipid Profile No results for input(s): CHOL, HDL, LDLCALC, TRIG, CHOLHDL, LDLDIRECT in the last 72 hours. Thyroid function studies No results for input(s): TSH, T4TOTAL, T3FREE, THYROIDAB in the last 72 hours.  Invalid input(s): FREET3 Anemia work up No results for input(s): VITAMINB12, FOLATE,  FERRITIN, TIBC, IRON, RETICCTPCT in the last 72 hours. Urinalysis    Component Value Date/Time   COLORURINE YELLOW 07/20/2014 0114   APPEARANCEUR CLOUDY (A) 07/20/2014 0114   LABSPEC 1.013 07/20/2014 0114   PHURINE 6.0 07/20/2014 0114   GLUCOSEU NEGATIVE 07/20/2014 0114   HGBUR SMALL (A) 07/20/2014 0114   BILIRUBINUR NEGATIVE 07/20/2014 0114   KETONESUR NEGATIVE 07/20/2014 0114   PROTEINUR >300 (A) 07/20/2014 0114   UROBILINOGEN 0.2 07/20/2014 0114   NITRITE NEGATIVE 07/20/2014 0114   LEUKOCYTESUR NEGATIVE 07/20/2014 0114   Sepsis Labs Invalid input(s): PROCALCITONIN,  WBC,  LACTICIDVEN Microbiology Recent Results (from the past 240 hour(s))  MRSA PCR Screening     Status: Abnormal   Collection Time: 03/05/17  3:14 AM  Result Value Ref Range Status   MRSA by PCR POSITIVE (A) NEGATIVE Final    Comment:        The GeneXpert MRSA Assay (FDA approved for NASAL specimens only), is one component of a comprehensive MRSA colonization surveillance program. It is not intended to diagnose MRSA infection nor to guide or monitor treatment for MRSA infections. RESULT CALLED TO, READ BACK BY AND VERIFIED WITH: C.MARSHALL RN 03/05/17 0628 L.CHAMPION      SIGNED:   Jacquelin Hawking, MD [facility] 03/07/2017, 10:39 AM Pager [phone]  If 7PM-7AM, please contact night-coverage [email or web address] Password [email or web address]

## 2017-03-05 NOTE — Discharge Instructions (Addendum)
Jeremy Huerta,  You were in the hospital because of your very high blood pressure. This was improved with your medication and dialysis. Please follow-up with your nephrologist. You also had some back pain and there was evidence of a small stress fracture of your spine. This may be causing your pain versus some muscle strain. Please take try the muscle relaxer as needed, but do not use it if you plan on driving or operating heavy machinery. With regard to the swelling under your chin, this is an abscess caused by bacteria. I am prescribing Augmentin for 7 days. Please use heat to help with pain and drainage, in addition to pain medication. Please do not use the Norco while before you drive or operate heavy machinery.

## 2017-03-05 NOTE — Progress Notes (Signed)
PT Cancellation Note  Patient Details Name: Jeremy Huerta MRN: [id number] DOB: 09-02-1995   Cancelled Treatment:    Reason Eval/Treat Not Completed: Medical issues which prohibited therapy.  Very high BP, elevated labs and will ck later to see if pt is more medically stable.   Ivar DrapeRuth E Numan Zylstra 03/05/2017, 11:38 AM   Samul Dadauth Boubacar Lerette, PT MS Acute Rehab Dept. Number: [facility] [id number] and [facility] [phone]

## 2017-03-05 NOTE — Procedures (Signed)
   I was present at this dialysis session, have reviewed the session itself and made  appropriate changes Vinson Moselleob Macarthur Lorusso MD [facility] pager [phone]   03/05/2017, 4:30 PM

## 2017-03-05 NOTE — Consult Note (Addendum)
[facility] Renal Consultation Note    Indication for Consultation:  Management of ESRD/hemodialysis; anemia, hypertension/volume and secondary hyperparathyroidism  ZOX:[id number], Peyton Najjar, MD  HPI: Jeremy Huerta is a 21 y.o. male. ESRD 2/2 FSGS on HD MWF at [facility], first starting in 2016, on PD prior.  Past medical history significant for refractory HTN, GERD, h/o pancreatitis.  Patient admitted due to Hypertensive Urgency in setting of abnormal EKG.  Patient seen and examined at bedside. Believes his BP is high due to back pain for the last several weeks.  Describes pain as lower central back pain, worsened with movement, improved while laying flat. Denies injury.  Admits to intermittent hematuria for last 2 weeks, but does not make urine often.  Denies chest pain, n/v/d, shortness of breath, abdominal pain, dizziness, weakness, fever and chills.  Of note patient's compliance with coming to treatment has improved since last admission but he regularly signs off early.  Last treatment 10/16, completed 2.25hrs, signing off early due to cramping.     Past Medical History:  Diagnosis Date  . Acute respiratory failure (HCC)   . ESRD (end stage renal disease) (HCC)    TTS  . FSGS (focal segmental glomerulosclerosis)   . Hypertension   . Nausea & vomiting 11/2016   Past Surgical History:  Procedure Laterality Date  . AV FISTULA PLACEMENT  12/2015  . RENAL BIOPSY  2013   Family History  Problem Relation Age of Onset  . Hypertension Mother   . Diabetes Father   . Hypertension Father   . Kidney disease Father   . Hypertension Maternal Grandmother   . Diabetes Paternal Grandmother   . Hypertension Paternal Grandmother    Social History:  reports that he has never smoked. He has never used smokeless tobacco. He reports that he does not drink alcohol or use drugs. No Known Allergies Prior to Admission medications   Medication Sig Start Date End  Date Taking? Authorizing Provider  amLODipine (NORVASC) 10 MG tablet Take 10 mg by mouth daily.  07/15/14  Yes [provider]  aspirin EC 81 MG EC tablet Take 1 tablet (81 mg total) by mouth daily. 01/16/17  Yes Kathlen Mody, MD  B Complex-C-Folic Acid (B COMPLEX-VITAMIN C-FOLIC ACID) 1 MG tablet Take 1 tablet by mouth daily. 04/30/16  Yes [provider]  cinacalcet (SENSIPAR) 30 MG tablet Take 2 tablets (60 mg total) by mouth daily with supper. 12/04/16  Yes Rolly Salter, MD  cloNIDine (CATAPRES) 0.2 MG tablet Take 1 tablet (0.2 mg total) by mouth 3 (three) times daily. 12/27/16  Yes Drema Dallas, MD  hydrALAZINE (APRESOLINE) 100 MG tablet Take 100 mg by mouth 3 (three) times daily.  04/13/16  Yes [provider]  isosorbide mononitrate (IMDUR) 30 MG 24 hr tablet Take 3 tablets (90 mg total) by mouth daily. 12/27/16  Yes Drema Dallas, MD  labetalol (NORMODYNE) 200 MG tablet Take 400 mg by mouth 3 (three) times daily. 03/16/16  Yes [provider]  Nutritional Supplements (FEEDING SUPPLEMENT, NEPRO CARB STEADY,) LIQD Take 237 mLs by mouth 2 (two) times daily between meals. 01/15/17  Yes Kathlen Mody, MD  ondansetron (ZOFRAN ODT) 4 MG disintegrating tablet Take 1 tablet (4 mg total) by mouth every 8 (eight) hours as needed for nausea or vomiting. 12/02/16  Yes Ward, Chase Picket, PA-C  oxymetazoline (AFRIN) 0.05 % nasal spray Place 1 spray into both nostrils 2 (two) times daily as needed (for nose  bleed, soak cotton ball and hold with pressure with it in bleeding nare). 01/15/17  Yes Kathlen Mody, MD  sevelamer (RENAGEL) 800 MG tablet Take 2,400 mg by mouth 3 (three) times daily with meals.    Yes [provider]   Current Facility-Administered Medications  Medication Dose Route Frequency Provider Last Rate Last Dose  . acetaminophen (TYLENOL) tablet 1,000 mg  1,000 mg Oral Q8H PRN Narda Bonds, MD      . amLODipine (NORVASC) tablet 10 mg  10 mg  Oral Daily Jonah Blue, MD      . aspirin EC tablet 81 mg  81 mg Oral Daily Jonah Blue, MD      . calcium carbonate (dosed in mg elemental calcium) suspension 500 mg of elemental calcium  500 mg of elemental calcium Oral Q6H PRN Jonah Blue, MD      . camphor-menthol [facility]) lotion 1 application  1 application Topical Q8H PRN Jonah Blue, MD       And  . hydrOXYzine (ATARAX/VISTARIL) tablet 25 mg  25 mg Oral Q8H PRN Jonah Blue, MD      . cinacalcet [facility]) tablet 60 mg  60 mg Oral Q supper Jonah Blue, MD      . cloNIDine (CATAPRES) tablet 0.2 mg  0.2 mg Oral TID Jonah Blue, MD   0.2 mg at 03/05/17 0647  . docusate sodium (ENEMEEZ) enema 283 mg  1 enema Rectal PRN Jonah Blue, MD      . enoxaparin (LOVENOX) injection 30 mg  30 mg Subcutaneous Daily Jonah Blue, MD      . feeding supplement (NEPRO CARB STEADY) liquid 237 mL  237 mL Oral BID BM Jonah Blue, MD      . feeding supplement (NEPRO CARB STEADY) liquid 237 mL  237 mL Oral TID PRN Jonah Blue, MD      . hydrALAZINE (APRESOLINE) tablet 100 mg  100 mg Oral TID Richardson Dopp, RPH   100 mg at 03/05/17 0647  . isosorbide mononitrate (IMDUR) 24 hr tablet 90 mg  90 mg Oral Daily Jonah Blue, MD      . labetalol (NORMODYNE) tablet 400 mg  400 mg Oral TID Jonah Blue, MD   400 mg at 03/05/17 0647  . methocarbamol (ROBAXIN) 500 mg in dextrose 5 % 50 mL IVPB  500 mg Intravenous Q6H PRN Jonah Blue, MD      . multivitamin (RENA-VIT) tablet 1 tablet  1 tablet Oral Noemi Chapel, MD   1 tablet at 03/05/17 1610  . ondansetron (ZOFRAN) tablet 4 mg  4 mg Oral Q6H PRN Jonah Blue, MD       Or  . ondansetron [facility]) injection 4 mg  4 mg Intravenous Q6H PRN Jonah Blue, MD      . oxymetazoline (AFRIN) 0.05 % nasal spray 1 spray  1 spray Each Nare BID PRN Jonah Blue, MD      . sevelamer carbonate (RENVELA) tablet 800 mg  800 mg Oral TID WC Jonah Blue, MD   800 mg at  03/05/17 0759  . sodium chloride flush (NS) 0.9 % injection 3 mL  3 mL Intravenous Q12H Jonah Blue, MD   3 mL at 03/05/17 0337  . sorbitol 70 % solution 30 mL  30 mL Oral PRN Jonah Blue, MD      . zolpidem Remus Loffler) tablet 5 mg  5 mg Oral QHS PRN Jonah Blue, MD       Labs: Basic Metabolic Panel:  Recent  Labs Lab 03/04/17 1603 03/05/17 0715  NA 137 134*  K 4.5 6.2*  CL 97* 96*  CO2 28 23  GLUCOSE 93 94  BUN 28* 40*  CREATININE 13.11* 14.89*  CALCIUM 9.2 10.4*   Liver Function Tests:  Recent Labs Lab 03/04/17 1603  AST 18  ALT 11*  ALKPHOS 67  BILITOT 0.5  PROT 7.7  ALBUMIN 3.7   CBC:  Recent Labs Lab 03/04/17 1603 03/05/17 0715  WBC 8.7 9.4  HGB 12.6* 13.5  HCT 40.3 42.7  MCV 89.2 89.0  PLT 265 343   Cardiac Enzymes:  Recent Labs Lab 03/04/17 1603 03/04/17 2326  TROPONINI 0.05* 0.05*   Studies/Results: Dg Chest 2 View  Result Date: 03/04/2017 CLINICAL DATA:  Wheezing EXAM: CHEST  2 VIEW COMPARISON:  01/14/2017 FINDINGS: Cardiac enlargement. Pulmonary vascular congestion without edema or effusion. Negative for infiltrate. IMPRESSION: Pulmonary vascular congestion without edema or effusion. Electronically Signed   By: Marlan Palau M.D.   On: 03/04/2017 16:38   Dg Lumbar Spine 2-3 Views  Result Date: 03/04/2017 CLINICAL DATA:  Low back pain without known injury. EXAM: LUMBAR SPINE - 2-3 VIEW COMPARISON:  None. FINDINGS: Normal lumbar segmentation. No lumbar fracture. Possible pars defect at L5 with minimal grade 1 retrolisthesis. Disc spaces are maintained. Mild physiologic anterior wedging of T10 and T11. IMPRESSION: Possible pars defect of L5 with minimal retrolisthesis. No acute fracture or significant disc space narrowing. Electronically Signed   By: Tollie Eth M.D.   On: 03/04/2017 23:01    ROS: All others negative except those listed in HPI.  Physical Exam: Vitals:   03/05/17 0030 03/05/17 0120 03/05/17 0135 03/05/17 0537  BP:  (!) 192/129  (!) 178/119 (!) 173/102  Pulse: 87  85 71  Resp: (!) 23  19 18   Temp:   97.8 F (36.6 C) 98.3 F (36.8 C)  TempSrc:   Oral Oral  SpO2: 99%  99% 100%  Weight:   86.1 kg (189 lb 12.8 oz)   Height:  6' (1.829 m) 6' (1.829 m)      General: WDWN, NAD Head: NCAT, sclera not icteric, MMM Neck: Supple. No lymphadenopathy Lungs: CTA bilaterally. No wheeze, rales or rhonchi. Breathing is unlabored. Heart: RRR. No murmur, rubs or gallops.  Abdomen: soft, nontender, +BS, no guarding, no rebound tenderness M/S: +tenderness of lumbar spine, and tenderness b/l Paraspinous muscle from T10-L5 Lower extremities:no edema, ischemic changes, or open wounds  Neuro: A&Ox3. Moves all extremities spontaneously. Psych:  Responds to questions appropriately with a normal affect. Dialysis Access: LU AVF +bruit/thrill  Dialysis Orders:   [facility]  4h   85.5  2/2.25  Hep 4600  LUA AVF  Venofer 100mg  IV qHD x10 -ending 10/25 mircera IV q2wks last 10/11 Hectorol IV qHD  Last Labs: 10/11 Hgb 11.6, Ca 9.9, P 7.1, 9/27: TSAT 15%, K 4.9, PTH186, Alb 4.2  Assessment/Plan: 1. Hypertensive Urgency - Continue meds, BP still elevated, CXR 10/16 indicated vascular congestion, titrate down volume with HD for continued improvement.  2. Back pain - Lumbar spine film indicates possible pars fracture w/ minimal posterior displacement. Per primary. 3. ESRD -  K 6.2, extra HD today for 3hrs. Titrate down volume. 4.  Anemia  - Hgb 13.5. No ESA needed.  5.  Secondary Hyperparathyroidism -  Calcium elevated. Outpatient P elevated. Follow. Continue Sensipar, Renagel, and Hectorol. 6. Nutrition - Alb 3.7. Renal/carb modified diet. Renavite.  Virgina Norfolk, PA-C [facility] Pager:  [phone] 03/05/2017, 10:00 AM   Pt seen, examined and agree w A/P as above. ESRD pt on HD 2 yrs, hx HTN and FSGS presented with back pain and HTN urgency.  May be losing body weight, challenge  dry wt on HD today.   Vinson Moselleob Jacee Enerson MD [facility] pager [phone]   03/05/2017, 4:29 PM

## 2017-03-05 NOTE — Progress Notes (Signed)
PROGRESS NOTE    Jeremy GoldenDeanthony Overby  XLK:[id number] DOB: 12/14/1995 DOA: 03/04/2017 PCP: Charlotte Sanesobinson, Todd Wayne, MD   Brief Narrative: Jeremy Huerta is a 21 y.o. 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 Back x-ray significant for pars defect. Symptomatic treatment. Also found to have hypertensive urgency which has now improved. EKG changes present that have remained persistent. Cardiology consult in AM. NPO overnight   Assessment & Plan:   Principal Problem:   Hypertensive urgency Active Problems:   End-stage renal disease on hemodialysis (HCC)   Back pain   Hypertensive urgency Resolved with home antihypertensive medications -continue Labetalol, clonidine, Imdur, amlodipine  Back pain Acute. No red flags. Worsened and limiting mobility. PT ordered but did not Huerta today (attempt made) -PT eval -Robaxin, Tylenol, Kpad. Norco for severe pain -if remains severe, will obtain CT scan in AM  T-wave abnormalities Troponin with flat trend. No chest pain. Repeat EKG unchanged. -consult cardiology in AM -NPO in anticipation of possible stress test -echocardiogram pending H  DVT prophylaxis: Heparin subq Code Status: Full code Family Communication: None at bedside Disposition Plan: Discharge in 24 hours pending cardiac workup   Consultants:   None, cardiology in AM  Procedures:   None  Antimicrobials:  None    Subjective: Back pain persistent.  Objective: Vitals:   03/05/17 1230 03/05/17 1300 03/05/17 1330 03/05/17 1340  BP: (!) 170/85 (!) 164/88 (!) 163/80 (!) 160/79  Pulse: 87 81 80 78  Resp: 18 18 17 18   Temp:    98.3 F (36.8 C)  TempSrc:    Oral  SpO2:    95%  Weight:    84.1 kg (185 lb 6.5 oz)  Height:        Intake/Output Summary (Last 24 hours) at 03/05/17 1753 Last data filed at 03/05/17 1340  Gross per 24 hour  Intake                3 ml  Output             3000 ml  Net             -2997 ml   Filed Weights   03/05/17 0135 03/05/17 1020 03/05/17 1340  Weight: 86.1 kg (189 lb 12.8 oz) 87.1 kg (192 lb 0.3 oz) 84.1 kg (185 lb 6.5 oz)    Examination:  General exam: Appears calm and comfortable Respiratory system: Clear to auscultation. Respiratory effort normal. Cardiovascular system: S1 & S2 heard, RRR. No murmurs. Gastrointestinal system: Abdomen is nondistended, soft and nontender. No organomegaly or masses felt. Normal bowel sounds heard. Central nervous system: Alert and oriented. No focal neurological deficits. Extremities: No edema. No calf tenderness Skin: No cyanosis. No rashes Psychiatry: Judgement and insight appear normal. Mood & affect appropriate.     Data Reviewed: I have personally reviewed following labs and imaging studies  CBC:  Recent Labs Lab 03/04/17 1603 03/05/17 0715  WBC 8.7 9.4  HGB 12.6* 13.5  HCT 40.3 42.7  MCV 89.2 89.0  PLT 265 343   Basic Metabolic Panel:  Recent Labs Lab 03/04/17 1603 03/05/17 0715  NA 137 134*  K 4.5 6.2*  CL 97* 96*  CO2 28 23  GLUCOSE 93 94  BUN 28* 40*  CREATININE 13.11* 14.89*  CALCIUM 9.2 10.4*   GFR: Estimated Creatinine Clearance: 8.5 mL/min (A) (by C-G formula based on SCr of 14.89 mg/dL (H)). Liver Function Tests:  Recent  Labs Lab 03/04/17 1603  AST 18  ALT 11*  ALKPHOS 67  BILITOT 0.5  PROT 7.7  ALBUMIN 3.7   No results for input(s): LIPASE, AMYLASE in the last 168 hours. No results for input(s): AMMONIA in the last 168 hours. Coagulation Profile: No results for input(s): INR, PROTIME in the last 168 hours. Cardiac Enzymes:  Recent Labs Lab 03/04/17 1603 03/04/17 2326  TROPONINI 0.05* 0.05*   BNP (last 3 results) No results for input(s): PROBNP in the last 8760 hours. HbA1C: No results for input(s): HGBA1C in the last 72 hours. CBG: No results for input(s): GLUCAP in the last 168 hours. Lipid Profile: No results for input(s): CHOL, HDL, LDLCALC,  TRIG, CHOLHDL, LDLDIRECT in the last 72 hours. Thyroid Function Tests: No results for input(s): TSH, T4TOTAL, FREET4, T3FREE, THYROIDAB in the last 72 hours. Anemia Panel: No results for input(s): VITAMINB12, FOLATE, FERRITIN, TIBC, IRON, RETICCTPCT in the last 72 hours. Sepsis Labs: No results for input(s): PROCALCITON, LATICACIDVEN in the last 168 hours.  Recent Results (from the past 240 hour(s))  MRSA PCR Screening     Status: Abnormal   Collection Time: 03/05/17  3:14 AM  Result Value Ref Range Status   MRSA by PCR POSITIVE (A) NEGATIVE Final    Comment:        The GeneXpert MRSA Assay (FDA approved for NASAL specimens only), is one component of a comprehensive MRSA colonization surveillance program. It is not intended to diagnose MRSA infection nor to guide or monitor treatment for MRSA infections. RESULT CALLED TO, READ BACK BY AND VERIFIED WITH: C.MARSHALL RN 03/05/17 0628 L.CHAMPION          Radiology Studies: Dg Chest 2 View  Result Date: 03/04/2017 CLINICAL DATA:  Wheezing EXAM: CHEST  2 VIEW COMPARISON:  01/14/2017 FINDINGS: Cardiac enlargement. Pulmonary vascular congestion without edema or effusion. Negative for infiltrate. IMPRESSION: Pulmonary vascular congestion without edema or effusion. Electronically Signed   By: Marlan Palau M.D.   On: 03/04/2017 16:38   Dg Lumbar Spine 2-3 Views  Result Date: 03/04/2017 CLINICAL DATA:  Low back pain without known injury. EXAM: LUMBAR SPINE - 2-3 VIEW COMPARISON:  None. FINDINGS: Normal lumbar segmentation. No lumbar fracture. Possible pars defect at L5 with minimal grade 1 retrolisthesis. Disc spaces are maintained. Mild physiologic anterior wedging of T10 and T11. IMPRESSION: Possible pars defect of L5 with minimal retrolisthesis. No acute fracture or significant disc space narrowing. Electronically Signed   By: Tollie Eth M.D.   On: 03/04/2017 23:01        Scheduled Meds: . amLODipine  10 mg Oral Daily  .  aspirin EC  81 mg Oral Daily  . cinacalcet  60 mg Oral Q supper  . cloNIDine  0.2 mg Oral TID  . enoxaparin (LOVENOX) injection  30 mg Subcutaneous Daily  . feeding supplement (NEPRO CARB STEADY)  237 mL Oral BID BM  . hydrALAZINE  100 mg Oral TID  . isosorbide mononitrate  90 mg Oral Daily  . labetalol  400 mg Oral TID  . multivitamin  1 tablet Oral QHS  . sevelamer carbonate  800 mg Oral TID WC  . sodium chloride flush  3 mL Intravenous Q12H   Continuous Infusions: . methocarbamol (ROBAXIN)  IV       LOS: 0 days     Jacquelin Hawking, MD [facility] 03/05/2017, 5:53 PM Pager: [phone]  If 7PM-7AM, please contact night-coverage [email or web address] Password [email or web address] 03/05/2017, 5:53 PM

## 2017-03-06 ENCOUNTER — Inpatient Hospital Stay (HOSPITAL_COMMUNITY): Payer: Medicaid Other

## 2017-03-06 DIAGNOSIS — M4306 Spondylolysis, lumbar region: Secondary | ICD-10-CM

## 2017-03-06 DIAGNOSIS — R590 Localized enlarged lymph nodes: Secondary | ICD-10-CM

## 2017-03-06 LAB — ECHOCARDIOGRAM COMPLETE
CHL CUP DOP CALC LVOT VTI: 33.7 cm
CHL CUP TV REG PEAK VELOCITY: 233 cm/s
E decel time: 229 msec
E/e' ratio: 16.93
FS: 33 % (ref 28–44)
Height: 72 in
IVS/LV PW RATIO, ED: 1
LA ID, A-P, ES: 41 mm
LA diam end sys: 41 mm
LA diam index: 1.99 cm/m2
LA vol A4C: 112 ml
LA vol index: 48.3 mL/m2
LAVOL: 99.4 mL
LV E/e' medial: 16.93
LV E/e'average: 16.93
LV PW d: 13.9 mm — AB (ref 0.6–1.1)
LV TDI E'LATERAL: 5.87
LVELAT: 5.87 cm/s
LVOT area: 3.14 cm2
LVOT peak grad rest: 11 mmHg
LVOT peak vel: 169 cm/s
LVOTD: 20 mm
LVOTSV: 106 mL
Lateral S' vel: 14.3 cm/s
MV Dec: 229
MV Peak grad: 4 mmHg
MV pk A vel: 66.5 m/s
MV pk E vel: 99.4 m/s
TAPSE: 30.5 mm
TDI e' medial: 6.31
TRMAXVEL: 233 cm/s
Weight: 2966.51 oz

## 2017-03-06 MED ORDER — HEPARIN SODIUM (PORCINE) 1000 UNIT/ML DIALYSIS: 4000.0000 [IU] | Freq: Once | INTRAMUSCULAR | Status: DC

## 2017-03-06 MED ORDER — PENTAFLUOROPROP-TETRAFLUOROETH EX AERO: 1.0000 "application " | INHALATION_SPRAY | CUTANEOUS | Status: DC | PRN

## 2017-03-06 MED ORDER — LIDOCAINE HCL (PF) 1 % IJ SOLN: 5.0000 mL | INTRAMUSCULAR | Status: DC | PRN

## 2017-03-06 MED ORDER — AMOXICILLIN-POT CLAVULANATE 500-125 MG PO TABS
1.0000 | ORAL_TABLET | Freq: Every day | ORAL | Status: DC
  Administered 2017-03-06: 500 mg via ORAL
  Filled 2017-03-06: qty 1

## 2017-03-06 MED ORDER — SODIUM CHLORIDE 0.9 % IV SOLN: 100.0000 mL | INTRAVENOUS | Status: DC | PRN

## 2017-03-06 MED ORDER — HEPARIN SODIUM (PORCINE) 1000 UNIT/ML DIALYSIS: 1000.0000 [IU] | INTRAMUSCULAR | Status: DC | PRN

## 2017-03-06 MED ORDER — LIDOCAINE-PRILOCAINE 2.5-2.5 % EX CREA: 1.0000 "application " | TOPICAL_CREAM | CUTANEOUS | Status: DC | PRN

## 2017-03-06 MED ORDER — HYDROCODONE-ACETAMINOPHEN 5-325 MG PO TABS: 1.0000 | ORAL_TABLET | Freq: Four times a day (QID) | ORAL | 0 refills | Status: DC | PRN

## 2017-03-06 MED ORDER — HYDROCODONE-ACETAMINOPHEN 5-325 MG PO TABS
ORAL_TABLET | ORAL | Status: AC
  Filled 2017-03-06: qty 2

## 2017-03-06 MED ORDER — AMOXICILLIN-POT CLAVULANATE 500-125 MG PO TABS: 1.0000 | ORAL_TABLET | Freq: Every day | ORAL | 0 refills | Status: AC

## 2017-03-06 MED ORDER — ALTEPLASE 2 MG IJ SOLR: 2.0000 mg | Freq: Once | INTRAMUSCULAR | Status: DC | PRN

## 2017-03-06 NOTE — Progress Notes (Signed)
Telemetry informed this RN that patient had a 2.50 second pause. RN notified on call, [place].

## 2017-03-06 NOTE — Progress Notes (Signed)
Jeremy Huerta Progress Note  Subjective: feeling better, has a boil under his chin.  BP's better.    Vitals:   03/05/17 1700 03/05/17 2110 03/06/17 0516 03/06/17 0900  BP: (!) 167/83 (!) 164/107 (!) 162/101 (!) 167/87  Pulse: 74 91 88 89  Resp: 18 20 20 18   Temp: 98.2 F (36.8 C)  98.1 F (36.7 C) 98.2 F (36.8 C)  TempSrc: Oral  Oral Oral  SpO2: 100%  100% 100%  Weight:      Height:        Inpatient medications: . amLODipine  10 mg Oral Daily  . aspirin EC  81 mg Oral Daily  . cinacalcet  60 mg Oral Q supper  . cloNIDine  0.2 mg Oral TID  . feeding supplement (NEPRO CARB STEADY)  237 mL Oral BID BM  . heparin  5,000 Units Subcutaneous Q8H  . hydrALAZINE  100 mg Oral TID  . isosorbide mononitrate  90 mg Oral Daily  . labetalol  400 mg Oral TID  . multivitamin  1 tablet Oral QHS  . sevelamer carbonate  800 mg Oral TID WC  . sodium chloride flush  3 mL Intravenous Q12H    acetaminophen, calcium carbonate (dosed in mg elemental calcium), camphor-menthol **AND** hydrOXYzine, docusate sodium, HYDROcodone-acetaminophen, methocarbamol, ondansetron **OR** ondansetron (ZOFRAN) IV, oxymetazoline, sorbitol, zolpidem  Exam: General: WDWN, NAD Head: NCAT, sclera not icteric, MMM Neck: Supple. No lymphadenopathy Lungs: CTA bilaterally. No wheeze, rales or rhonchi. Breathing is unlabored. Heart: RRR. No murmur, rubs or gallops.  Abdomen: soft, nontender, +BS, no guarding, no rebound tenderness M/S: +tenderness of lumbar spine, and tenderness b/l Paraspinous muscle from T10-L5 Lower extremities:no edema, ischemic changes, or open wounds  Neuro: A&Ox3. Moves all extremities spontaneously. Psych:  Responds to questions appropriately with a normal affect. Dialysis Access: LU AVF +bruit/thrill  Dialysis Orders:   [facility]  4h   85.5  2/2.25  Hep 4600  LUA AVF  Venofer 100mg  IV qHD x10 -ending 10/25 mircera 100mcg IV q2wks last 10/11 Hectorol 7mcg IV  qHD  Last Labs: 10/11 Hgb 11.6, Ca 9.9, P 7.1, 9/27: TSAT 15%, K 4.9, PTH186, Alb 4.2  Assessment: 1. Hypertensive Urgency - BP's better, got vol down and is under dry wt now.  2. Back pain - Lumbar spine film indicates possible pars fracture w/ minimal posterior displacement. per primary 3. ESRD -  short HD today and lower edw at dc.  4. Anemia  - Hgb 13.5. No ESA needed.  5. Secondary Hyperparathyroidism -  Calcium elevated. Outpatient P elevated. Follow. Continue Sensipar, Renagel, and Hectorol. 6. Nutrition - Alb 3.7. Renal/carb modified diet. Renavite.  Plan - for dc today   Vinson Moselleob Chang Tiggs MD [facility] pager [phone]   03/06/2017, 1:15 PM    Recent Labs Lab 03/04/17 1603 03/05/17 0715 03/05/17 1907  NA 137 134*  --   K 4.5 6.2* 4.7  CL 97* 96*  --   CO2 28 23  --   GLUCOSE 93 94  --   BUN 28* 40*  --   CREATININE 13.11* 14.89*  --   CALCIUM 9.2 10.4*  --     Recent Labs Lab 03/04/17 1603  AST 18  ALT 11*  ALKPHOS 67  BILITOT 0.5  PROT 7.7  ALBUMIN 3.7    Recent Labs Lab 03/04/17 1603 03/05/17 0715  WBC 8.7 9.4  HGB 12.6* 13.5  HCT 40.3 42.7  MCV 89.2 89.0  PLT 265 343  Iron/TIBC/Ferritin/ %Sat No results found for: IRON, TIBC, FERRITIN, IRONPCTSAT   

## 2017-03-06 NOTE — Evaluation (Signed)
Physical Therapy Evaluation Patient Details Name: Jeremy Huerta MRN: [id number] DOB: [birth year] Today's Date: 03/06/2017   History of Present Illness  21 yo male with complaints of back pain was admitted and noted L5 pars defect with minimal retrolisthesis, T10 and 11 anterior wedging, minor.  PMHx:  HCAP, ESRD on HD, acute respiratory failure, anemia related to condition.  Clinical Impression  Pt is notably having improved BP readings in all positions.  Reviewed back posture, initiated spinal ROM and strengthening exercises in neutral spine and spoke with MD to refer to outpatient PT following DC.  Will DC PT for now as pt is not in need of mobility training and should be able to make great progress from here out in next venue.    Follow Up Recommendations Outpatient PT    Equipment Recommendations  None recommended by PT    Recommendations for Other Services       Precautions / Restrictions Precautions Precautions: Fall;Back Precaution Booklet Issued: No Precaution Comments: reviewed avoidance of arching per pars defect Restrictions Weight Bearing Restrictions: No      Mobility  Bed Mobility Overal bed mobility: Modified Independent             General bed mobility comments: reminders for body mechanics and postures for sleeping including avoiding stomach sleeping  Transfers Overall transfer level: Modified independent Equipment used: None                Ambulation/Gait Ambulation/Gait assistance: Modified independent (Device/Increase time) Ambulation Distance (Feet): 40 Feet Assistive device: None Gait Pattern/deviations: Step-through pattern;Decreased stride length;Wide base of support;Trunk flexed;Antalgic Gait velocity: reduced Gait velocity interpretation: Below normal speed for age/gender General Gait Details: pt is mod I to walk but has some initial stiffness of back impacting gait  Stairs            Wheelchair Mobility    Modified  Rankin (Stroke Patients Only)       Balance Overall balance assessment: Modified Independent                                           Pertinent Vitals/Pain Pain Assessment: 0-10 Pain Score: 5  Pain Location: back with standing Pain Descriptors / Indicators: Spasm Pain Intervention(s): Monitored during session;Premedicated before session;Repositioned;Other (comment) (instruction of neutral spine exercises)    Home Living Family/patient expects to be discharged to:: Private residence Living Arrangements: Non-relatives/Friends Available Help at Discharge: Friend(s);Available PRN/intermittently Type of Home: House       Home Layout: One level Home Equipment: None      Prior Function Level of Independence: Independent (working at [employer] in standing no lifting)               Hand Dominance   Dominant Hand: Right    Extremity/Trunk Assessment   Upper Extremity Assessment Upper Extremity Assessment: Overall WFL for tasks assessed    Lower Extremity Assessment Lower Extremity Assessment: Overall WFL for tasks assessed    Cervical / Trunk Assessment Cervical / Trunk Assessment: Other exceptions (L5 pars defect with R sided spasm)  Communication   Communication: No difficulties  Cognition Arousal/Alertness: Awake/alert Behavior During Therapy: WFL for tasks assessed/performed Overall Cognitive Status: Within Functional Limits for tasks assessed  General Comments General comments (skin integrity, edema, etc.): checked BP supine with 155/90 and pulse 75, sitting 151/88 with pulse 82, and standing 154/83 pulse 83.    Exercises Other Exercises Other Exercises: instructed  active gentle trunk rotation with breathing to stretch and trunk elongation in sitting and standing to relax spasm, to be done without increasing pain   Assessment/Plan    PT Assessment All further PT needs can be met in  the next venue of care  PT Problem List Decreased activity tolerance;Decreased mobility;Cardiopulmonary status limiting activity;Pain;Decreased skin integrity (bump under jaw bone which MD has assessed)       PT Treatment Interventions      PT Goals (Current goals can be found in the Care Plan section)  Acute Rehab PT Goals Patient Stated Goal: to get rid of back pain PT Goal Formulation: With patient Time For Goal Achievement: 03/20/17 Potential to Achieve Goals: Good    Frequency     Barriers to discharge        Co-evaluation               AM-PAC PT "6 Clicks" Daily Activity  Outcome Measure Difficulty turning over in bed (including adjusting bedclothes, sheets and blankets)?: A Little Difficulty moving from lying on back to sitting on the side of the bed? : A Little Difficulty sitting down on and standing up from a chair with arms (e.g., wheelchair, bedside commode, etc,.)?: A Little Help needed moving to and from a bed to chair (including a wheelchair)?: A Little Help needed walking in hospital room?: A Little Help needed climbing 3-5 steps with a railing? : A Little 6 Click Score: 18    End of Session   Activity Tolerance: Patient tolerated treatment well;Patient limited by pain Patient left: in chair;with call bell/phone within reach;Other (comment) (echo tech arrived) Nurse Communication: Mobility status;Other (comment) (BP and follow up recommendations to MD) PT Visit Diagnosis: Other abnormalities of gait and mobility (R26.89);Pain;Difficulty in walking, not elsewhere classified (R26.2) Pain - Right/Left: Right Pain - part of body:  (back)    Time: 9983-3825 PT Time Calculation (min) (ACUTE ONLY): 35 min   Charges:   PT Evaluation $PT Eval Moderate Complexity: 1 Mod PT Treatments $Therapeutic Exercise: 8-22 mins   PT G Codes:   PT G-Codes **NOT FOR INPATIENT CLASS** Functional Assessment Tool Used: AM-PAC 6 Clicks Basic Mobility    [name] 03/06/2017, 9:28 AM   Mee Hives, PT MS Acute Rehab Dept. Number: [place] and [place]

## 2017-03-06 NOTE — Plan of Care (Signed)
Problem: Acute Rehab PT Goals(only PT should resolve) Goal: Pt/caregiver will Perform Home Exercise Program To support the lumbar spine

## 2017-03-06 NOTE — Care Management Note (Addendum)
Case Management Note  Patient Details  Name: Jeremy Huerta MRN: [id number] Date of Birth: 11/04/1995  Subjective/Objective:      CM following for progression and d/c planning.               Action/Plan: 03/06/2017 This CM unable to speak with this pt today due to pt time away from the nursing unit. If pt is d/c later today, I will attempt to reach the pt at home to set up outpt rehab per pt wishes.  03/07/2017 Met with pt who confirmed that he has transportation. Application completed for outpt rehab at [facility] and faxed along with demographics and PT info to that facility. Pt informed that the rehab center will contact him to arrange appoinments. Pt states that he has no HH or DME needs.   Expected Discharge Date:  03/06/17               Expected Discharge Plan:   Home with self care.  In-House Referral:   NA  Discharge planning Services   Out patient PT  Post Acute Care Choice:   NA Choice offered to:   NA  DME Arranged:   NA DME Agency:   NA  HH Arranged:   NA HH Agency:   NA  Status of Service:   Complete  If discussed at Long Length of Stay Meetings, dates discussed:    Additional Comments:  Royal, Cheryl U, RN 03/06/2017, 4:56 PM  

## 2017-03-06 NOTE — Progress Notes (Signed)
  Echocardiogram 2D Echocardiogram has been performed.  Jeremy Huerta, Jeremy Huerta F 03/06/2017, 10:07 AM

## 2017-03-07 DIAGNOSIS — L0291 Cutaneous abscess, unspecified: Secondary | ICD-10-CM

## 2017-03-07 DIAGNOSIS — L02811 Cutaneous abscess of head [any part, except face]: Secondary | ICD-10-CM

## 2017-03-07 NOTE — Progress Notes (Signed)
Telemetry notified RN that patient had a 2.66second pause followed by a 2.41second pause. RN notified on call NP, Schorr.

## 2017-03-07 NOTE — Progress Notes (Signed)
PROGRESS NOTE    Jeremy Huerta  ZOX:[id number] DOB: 04/02/1996 DOA: 03/04/2017 PCP: [name], MD   Brief Narrative: Jeremy Huerta is a 21 y.o. 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 Back x-ray significant for pars defect. Symptomatic treatment. Also found to have hypertensive urgency which has now improved. EKG changes present that have remained persistent. Cardiology consult in AM. NPO overnight   Assessment & Plan:   Principal Problem:   Hypertensive urgency Active Problems:   End-stage renal disease on hemodialysis (HCC)   Back pain   Pars defect of lumbar spine   Skin abscess   Hypertensive urgency Resolved with home antihypertensive medications -continue Labetalol, clonidine, Imdur, amlodipine  Back pain Pars defect Acute. No red flags. Worsened and limiting mobility. -PT: outpatient PT -Robaxin, Tylenol, Kpad. Norco for severe pain  T-wave abnormalities Troponin with flat trend. No chest pain. Repeat EKG unchanged. Cardiology recommending no stress test.  Skin Abscess Likely from shaving/hair cut. Opened up overnight and is draining -warm compress   DVT prophylaxis: Heparin subq Code Status: Full code Family Communication: None at bedside Disposition Plan: Discharge today   Consultants:   Nephrology  Procedures:   Dialysis  Antimicrobials:  Augmentin   Subjective: Draining chin wound.  Objective: Vitals:   03/06/17 1530 03/06/17 1600 03/06/17 2115 03/07/17 0512  BP: (!) 173/84 (!) 172/89 (!) 175/89 (!) 168/89  Pulse: 87 84 89 90  Resp: 18 18 18 20   Temp:   99.1 F (37.3 C) 98.9 F (37.2 C)  TempSrc:   Oral Oral  SpO2: 100% 100% 97% 98%  Weight:      Height:        Intake/Output Summary (Last 24 hours) at 03/07/17 1035 Last data filed at 03/07/17 0600  Gross per 24 hour  Intake              282 ml  Output                0 ml  Net               282 ml   Filed Weights   03/05/17 1020 03/05/17 1340 03/06/17 1347  Weight: 87.1 kg (192 lb 0.3 oz) 84.1 kg (185 lb 6.5 oz) 84.7 kg (186 lb 11.7 oz)    Examination:  General exam: Appears calm and comfortable HEENT: indurated mass under chin that is significantly tender. Respiratory system: Clear to auscultation. Respiratory effort normal. Cardiovascular system: S1 & S2 heard, RRR. No murmurs. Gastrointestinal system: Abdomen is nondistended, soft and nontender. Normal bowel sounds heard. Central nervous system: Alert and oriented. No focal neurological deficits. Extremities: No edema. No calf tenderness Skin: No cyanosis. No rashes Psychiatry: Judgement and insight appear normal. Mood & affect appropriate.     Data Reviewed: I have personally reviewed following labs and imaging studies  CBC:  Recent Labs Lab 03/04/17 1603 03/05/17 0715  WBC 8.7 9.4  HGB 12.6* 13.5  HCT 40.3 42.7  MCV 89.2 89.0  PLT 265 343   Basic Metabolic Panel:  Recent Labs Lab 03/04/17 1603 03/05/17 0715 03/05/17 1907  NA 137 134*  --   K 4.5 6.2* 4.7  CL 97* 96*  --   CO2 28 23  --   GLUCOSE 93 94  --   BUN 28* 40*  --   CREATININE 13.11* 14.89*  --   CALCIUM 9.2 10.4*  --  GFR: Estimated Creatinine Clearance: 8.5 mL/min (A) (by C-G formula based on SCr of 14.89 mg/dL (H)). Liver Function Tests:  Recent Labs Lab 03/04/17 1603  AST 18  ALT 11*  ALKPHOS 67  BILITOT 0.5  PROT 7.7  ALBUMIN 3.7   No results for input(s): LIPASE, AMYLASE in the last 168 hours. No results for input(s): AMMONIA in the last 168 hours. Coagulation Profile: No results for input(s): INR, PROTIME in the last 168 hours. Cardiac Enzymes:  Recent Labs Lab 03/04/17 1603 03/04/17 2326  TROPONINI 0.05* 0.05*   BNP (last 3 results) No results for input(s): PROBNP in the last 8760 hours. HbA1C: No results for input(s): HGBA1C in the last 72 hours. CBG: No results for input(s): GLUCAP in the last  168 hours. Lipid Profile: No results for input(s): CHOL, HDL, LDLCALC, TRIG, CHOLHDL, LDLDIRECT in the last 72 hours. Thyroid Function Tests: No results for input(s): TSH, T4TOTAL, FREET4, T3FREE, THYROIDAB in the last 72 hours. Anemia Panel: No results for input(s): VITAMINB12, FOLATE, FERRITIN, TIBC, IRON, RETICCTPCT in the last 72 hours. Sepsis Labs: No results for input(s): PROCALCITON, LATICACIDVEN in the last 168 hours.  Recent Results (from the past 240 hour(s))  MRSA PCR Screening     Status: Abnormal   Collection Time: 03/05/17  3:14 AM  Result Value Ref Range Status   MRSA by PCR POSITIVE (A) NEGATIVE Final    Comment:        The GeneXpert MRSA Assay (FDA approved for NASAL specimens only), is one component of a comprehensive MRSA colonization surveillance program. It is not intended to diagnose MRSA infection nor to guide or monitor treatment for MRSA infections. RESULT CALLED TO, READ BACK BY AND VERIFIED WITH: C.MARSHALL RN 03/05/17 0628 L.CHAMPION          Radiology Studies: [place] Soft Tissue Neck  Result Date: 03/06/2017 CLINICAL DATA:  Submandibular abscess. Neck swelling and pain for 2 days. EXAM: ULTRASOUND OF HEAD/NECK SOFT TISSUES TECHNIQUE: Ultrasound examination of the head and neck soft tissues was performed in the area of clinical concern. COMPARISON:  None. FINDINGS: The midline of the neck was evaluated with ultrasound. There is a hypoechoic structure at the area of concern most compatible with a lymph node measuring 1.2 x 0.5 x 1.3 cm. The soft tissue anterior and superior to this lymph node appears to be edematous. IMPRESSION: Edematous tissue at the area of concern with a mildly prominent lymph node. No discrete fluid or abscess collection identified. Electronically Signed   By: Richarda OverlieAdam  Henn M.D.   On: 03/06/2017 11:04        Scheduled Meds: . amLODipine  10 mg Oral Daily  . amoxicillin-clavulanate  1 tablet Oral QHS  . aspirin EC  81 mg Oral  Daily  . cinacalcet  60 mg Oral Q supper  . cloNIDine  0.2 mg Oral TID  . feeding supplement (NEPRO CARB STEADY)  237 mL Oral BID BM  . heparin  4,000 Units Dialysis Once in dialysis  . heparin  5,000 Units Subcutaneous Q8H  . hydrALAZINE  100 mg Oral TID  . isosorbide mononitrate  90 mg Oral Daily  . labetalol  400 mg Oral TID  . multivitamin  1 tablet Oral QHS  . sevelamer carbonate  800 mg Oral TID WC  . sodium chloride flush  3 mL Intravenous Q12H   Continuous Infusions: . sodium chloride    . sodium chloride       LOS: 2 days  Jacquelin Hawking, MD [facility] 03/07/2017, 10:35 AM Pager: [phone]  If 7PM-7AM, please contact night-coverage [email or web address] Password [email or web address] 03/07/2017, 10:35 AM

## 2017-03-12 ENCOUNTER — Ambulatory Visit: Payer: Medicaid Other | Admitting: Physical Therapy

## 2017-03-17 ENCOUNTER — Ambulatory Visit: Payer: Medicaid Other | Attending: Family Medicine | Admitting: Physical Therapy

## 2017-04-01 ENCOUNTER — Encounter (HOSPITAL_COMMUNITY): Payer: Self-pay

## 2017-04-01 ENCOUNTER — Other Ambulatory Visit: Payer: Self-pay

## 2017-04-01 ENCOUNTER — Emergency Department (HOSPITAL_COMMUNITY): Payer: Medicaid Other

## 2017-04-01 ENCOUNTER — Inpatient Hospital Stay (HOSPITAL_COMMUNITY)
Admission: EM | Admit: 2017-04-01 | Discharge: 2017-04-04 | DRG: 304 | Disposition: A | Discharge status: Home / Self Care | Payer: Medicaid Other | Attending: Pulmonary Disease | Admitting: Pulmonary Disease

## 2017-04-01 DIAGNOSIS — N186 End stage renal disease: Secondary | ICD-10-CM | POA: Diagnosis present

## 2017-04-01 DIAGNOSIS — I169 Hypertensive crisis, unspecified: Secondary | ICD-10-CM

## 2017-04-01 DIAGNOSIS — J069 Acute upper respiratory infection, unspecified: Secondary | ICD-10-CM | POA: Diagnosis present

## 2017-04-01 DIAGNOSIS — N2581 Secondary hyperparathyroidism of renal origin: Secondary | ICD-10-CM | POA: Diagnosis present

## 2017-04-01 DIAGNOSIS — Z833 Family history of diabetes mellitus: Secondary | ICD-10-CM

## 2017-04-01 DIAGNOSIS — I1311 Hypertensive heart and chronic kidney disease without heart failure, with stage 5 chronic kidney disease, or end stage renal disease: Secondary | ICD-10-CM | POA: Diagnosis present

## 2017-04-01 DIAGNOSIS — E875 Hyperkalemia: Secondary | ICD-10-CM | POA: Diagnosis present

## 2017-04-01 DIAGNOSIS — Z8249 Family history of ischemic heart disease and other diseases of the circulatory system: Secondary | ICD-10-CM

## 2017-04-01 DIAGNOSIS — I161 Hypertensive emergency: Principal | ICD-10-CM | POA: Diagnosis present

## 2017-04-01 DIAGNOSIS — Y636 Underdosing and nonadministration of necessary drug, medicament or biological substance: Secondary | ICD-10-CM | POA: Diagnosis present

## 2017-04-01 DIAGNOSIS — Z7982 Long term (current) use of aspirin: Secondary | ICD-10-CM

## 2017-04-01 DIAGNOSIS — Z841 Family history of disorders of kidney and ureter: Secondary | ICD-10-CM

## 2017-04-01 DIAGNOSIS — Z992 Dependence on renal dialysis: Secondary | ICD-10-CM

## 2017-04-01 DIAGNOSIS — Z9115 Patient's noncompliance with renal dialysis: Secondary | ICD-10-CM

## 2017-04-01 DIAGNOSIS — I160 Hypertensive urgency: Secondary | ICD-10-CM | POA: Diagnosis present

## 2017-04-01 DIAGNOSIS — M898X9 Other specified disorders of bone, unspecified site: Secondary | ICD-10-CM | POA: Diagnosis present

## 2017-04-01 LAB — BASIC METABOLIC PANEL
Anion gap: 14 (ref 5–15)
BUN: 78 mg/dL — ABNORMAL HIGH (ref 6–20)
CHLORIDE: 104 mmol/L (ref 101–111)
CO2: 17 mmol/L — AB (ref 22–32)
Calcium: 9.4 mg/dL (ref 8.9–10.3)
Creatinine, Ser: 23.72 mg/dL — ABNORMAL HIGH (ref 0.61–1.24)
GFR calc non Af Amer: 2 mL/min — ABNORMAL LOW (ref >60)
GFR, EST AFRICAN AMERICAN: 3 mL/min — AB (ref >60)
Glucose, Bld: 81 mg/dL (ref 65–99)
POTASSIUM: 6.1 mmol/L — AB (ref 3.5–5.1)
SODIUM: 135 mmol/L (ref 135–145)

## 2017-04-01 LAB — CBC
HEMATOCRIT: 39.9 % (ref 39.0–52.0)
HEMOGLOBIN: 12.8 g/dL — AB (ref 13.0–17.0)
MCH: 28.8 pg (ref 26.0–34.0)
MCHC: 32.1 g/dL (ref 30.0–36.0)
MCV: 89.9 fL (ref 78.0–100.0)
Platelets: 172 10*3/uL (ref 150–400)
RBC: 4.44 MIL/uL (ref 4.22–5.81)
RDW: 17.1 % — AB (ref 11.5–15.5)
WBC: 8.1 10*3/uL (ref 4.0–10.5)

## 2017-04-01 LAB — I-STAT TROPONIN, ED: Troponin i, poc: 0.06 ng/mL (ref 0.00–0.08)

## 2017-04-01 MED ORDER — SODIUM POLYSTYRENE SULFONATE 15 GM/60ML PO SUSP
50.0000 g | Freq: Once | ORAL | Status: AC
  Administered 2017-04-02: 50 g via ORAL
  Filled 2017-04-01: qty 240

## 2017-04-01 MED ORDER — INSULIN ASPART 100 UNIT/ML ~~LOC~~ SOLN
10.0000 [IU] | Freq: Once | SUBCUTANEOUS | Status: DC
  Administered 2017-04-02: 10 [IU] via INTRAVENOUS

## 2017-04-01 MED ORDER — ISOSORBIDE MONONITRATE ER 30 MG PO TB24: 30.0000 mg | ORAL_TABLET | Freq: Every day | ORAL | Status: DC

## 2017-04-01 MED ORDER — SODIUM BICARBONATE 8.4 % IV SOLN
50.0000 meq | Freq: Once | INTRAVENOUS | Status: AC
  Administered 2017-04-02: 50 meq via INTRAVENOUS
  Filled 2017-04-01: qty 50

## 2017-04-01 MED ORDER — CLONIDINE HCL 0.3 MG PO TABS: 0.3000 mg | ORAL_TABLET | Freq: Once | ORAL | Status: DC

## 2017-04-01 MED ORDER — HYDRALAZINE HCL 20 MG/ML IJ SOLN
10.0000 mg | Freq: Once | INTRAMUSCULAR | Status: AC
  Administered 2017-04-01: 10 mg via INTRAVENOUS
  Filled 2017-04-01: qty 1

## 2017-04-01 MED ORDER — INSULIN ASPART 100 UNIT/ML ~~LOC~~ SOLN
6.0000 [IU] | Freq: Once | SUBCUTANEOUS | Status: AC
  Administered 2017-04-02: 6 [IU] via INTRAVENOUS
  Filled 2017-04-01: qty 1

## 2017-04-01 MED ORDER — HYDRALAZINE HCL 50 MG PO TABS: 100.0000 mg | ORAL_TABLET | Freq: Once | ORAL | Status: DC

## 2017-04-01 MED ORDER — SODIUM CHLORIDE 0.9 % IV SOLN
1.0000 g | Freq: Once | INTRAVENOUS | Status: AC
  Administered 2017-04-02: 1 g via INTRAVENOUS
  Filled 2017-04-01: qty 10

## 2017-04-01 MED ORDER — DEXTROSE 50 % IV SOLN
1.0000 | Freq: Once | INTRAVENOUS | Status: AC
  Administered 2017-04-02: 50 mL via INTRAVENOUS
  Filled 2017-04-01: qty 50

## 2017-04-01 MED ORDER — HYDROMORPHONE HCL 1 MG/ML IJ SOLN
1.0000 mg | Freq: Once | INTRAMUSCULAR | Status: AC
  Administered 2017-04-01: 1 mg via INTRAVENOUS
  Filled 2017-04-01: qty 1

## 2017-04-01 NOTE — ED Triage Notes (Signed)
Pt. Arrives via. EMS for chest pain since Monday. Pt. Was at dialysis and did not complete treatment today. C/O SOB, productive cough, and cough X2 weeks. CBG 84.

## 2017-04-02 ENCOUNTER — Other Ambulatory Visit: Payer: Self-pay

## 2017-04-02 DIAGNOSIS — E875 Hyperkalemia: Secondary | ICD-10-CM | POA: Diagnosis present

## 2017-04-02 DIAGNOSIS — Z841 Family history of disorders of kidney and ureter: Secondary | ICD-10-CM | POA: Diagnosis not present

## 2017-04-02 DIAGNOSIS — I161 Hypertensive emergency: Secondary | ICD-10-CM | POA: Diagnosis present

## 2017-04-02 DIAGNOSIS — Z8249 Family history of ischemic heart disease and other diseases of the circulatory system: Secondary | ICD-10-CM | POA: Diagnosis not present

## 2017-04-02 DIAGNOSIS — Z9115 Patient's noncompliance with renal dialysis: Secondary | ICD-10-CM | POA: Diagnosis not present

## 2017-04-02 DIAGNOSIS — J069 Acute upper respiratory infection, unspecified: Secondary | ICD-10-CM | POA: Diagnosis present

## 2017-04-02 DIAGNOSIS — Y636 Underdosing and nonadministration of necessary drug, medicament or biological substance: Secondary | ICD-10-CM | POA: Diagnosis present

## 2017-04-02 DIAGNOSIS — M898X9 Other specified disorders of bone, unspecified site: Secondary | ICD-10-CM | POA: Diagnosis present

## 2017-04-02 DIAGNOSIS — I160 Hypertensive urgency: Secondary | ICD-10-CM

## 2017-04-02 DIAGNOSIS — Z833 Family history of diabetes mellitus: Secondary | ICD-10-CM | POA: Diagnosis not present

## 2017-04-02 DIAGNOSIS — N186 End stage renal disease: Secondary | ICD-10-CM | POA: Diagnosis present

## 2017-04-02 DIAGNOSIS — Z7982 Long term (current) use of aspirin: Secondary | ICD-10-CM | POA: Diagnosis not present

## 2017-04-02 DIAGNOSIS — N2581 Secondary hyperparathyroidism of renal origin: Secondary | ICD-10-CM | POA: Diagnosis present

## 2017-04-02 DIAGNOSIS — I1311 Hypertensive heart and chronic kidney disease without heart failure, with stage 5 chronic kidney disease, or end stage renal disease: Secondary | ICD-10-CM | POA: Diagnosis present

## 2017-04-02 DIAGNOSIS — Z992 Dependence on renal dialysis: Secondary | ICD-10-CM | POA: Diagnosis not present

## 2017-04-02 DIAGNOSIS — R079 Chest pain, unspecified: Secondary | ICD-10-CM | POA: Diagnosis present

## 2017-04-02 LAB — RESPIRATORY PANEL BY PCR
ADENOVIRUS-RVPPCR: NOT DETECTED
BORDETELLA PERTUSSIS-RVPCR: NOT DETECTED
CHLAMYDOPHILA PNEUMONIAE-RVPPCR: NOT DETECTED
CORONAVIRUS NL63-RVPPCR: NOT DETECTED
Coronavirus 229E: NOT DETECTED
Coronavirus HKU1: NOT DETECTED
Coronavirus OC43: NOT DETECTED
INFLUENZA A-RVPPCR: NOT DETECTED
INFLUENZA B-RVPPCR: NOT DETECTED
Metapneumovirus: NOT DETECTED
Mycoplasma pneumoniae: NOT DETECTED
PARAINFLUENZA VIRUS 3-RVPPCR: NOT DETECTED
PARAINFLUENZA VIRUS 4-RVPPCR: NOT DETECTED
Parainfluenza Virus 1: NOT DETECTED
Parainfluenza Virus 2: NOT DETECTED
RHINOVIRUS / ENTEROVIRUS - RVPPCR: NOT DETECTED
Respiratory Syncytial Virus: NOT DETECTED

## 2017-04-02 LAB — BASIC METABOLIC PANEL
Anion gap: 17 — ABNORMAL HIGH (ref 5–15)
BUN: 81 mg/dL — AB (ref 6–20)
CHLORIDE: 104 mmol/L (ref 101–111)
CO2: 19 mmol/L — ABNORMAL LOW (ref 22–32)
CREATININE: 23.93 mg/dL — AB (ref 0.61–1.24)
Calcium: 10 mg/dL (ref 8.9–10.3)
GFR calc Af Amer: 3 mL/min — ABNORMAL LOW (ref >60)
GFR calc non Af Amer: 2 mL/min — ABNORMAL LOW (ref >60)
Glucose, Bld: 75 mg/dL (ref 65–99)
POTASSIUM: 4.3 mmol/L (ref 3.5–5.1)
SODIUM: 140 mmol/L (ref 135–145)

## 2017-04-02 LAB — TROPONIN I
Troponin I: 0.09 ng/mL (ref <0.03)
Troponin I: 0.1 ng/mL (ref <0.03)
Troponin I: 0.16 ng/mL (ref <0.03)

## 2017-04-02 LAB — CBC
HCT: 42.5 % (ref 39.0–52.0)
HEMOGLOBIN: 13.8 g/dL (ref 13.0–17.0)
MCH: 29 pg (ref 26.0–34.0)
MCHC: 32.5 g/dL (ref 30.0–36.0)
MCV: 89.3 fL (ref 78.0–100.0)
PLATELETS: 177 10*3/uL (ref 150–400)
RBC: 4.76 MIL/uL (ref 4.22–5.81)
RDW: 17.2 % — ABNORMAL HIGH (ref 11.5–15.5)
WBC: 11.3 10*3/uL — ABNORMAL HIGH (ref 4.0–10.5)

## 2017-04-02 LAB — CBG MONITORING, ED: GLUCOSE-CAPILLARY: 78 mg/dL (ref 65–99)

## 2017-04-02 LAB — PHOSPHORUS: PHOSPHORUS: 5.1 mg/dL — AB (ref 2.5–4.6)

## 2017-04-02 LAB — MAGNESIUM: MAGNESIUM: 2.9 mg/dL — AB (ref 1.7–2.4)

## 2017-04-02 MED ORDER — HEPARIN SODIUM (PORCINE) 1000 UNIT/ML DIALYSIS: 1000.0000 [IU] | INTRAMUSCULAR | Status: DC | PRN

## 2017-04-02 MED ORDER — ONDANSETRON HCL 4 MG/2ML IJ SOLN
INTRAMUSCULAR | Status: AC
  Filled 2017-04-02: qty 2

## 2017-04-02 MED ORDER — SODIUM CHLORIDE 0.9 % IV SOLN: 100.0000 mL | INTRAVENOUS | Status: DC | PRN

## 2017-04-02 MED ORDER — PENTAFLUOROPROP-TETRAFLUOROETH EX AERO: 1.0000 "application " | INHALATION_SPRAY | CUTANEOUS | Status: DC | PRN

## 2017-04-02 MED ORDER — HYDRALAZINE HCL 20 MG/ML IJ SOLN
10.0000 mg | Freq: Once | INTRAMUSCULAR | Status: AC
  Administered 2017-04-02: 10 mg via INTRAVENOUS

## 2017-04-02 MED ORDER — ISOSORBIDE MONONITRATE ER 60 MG PO TB24
90.0000 mg | ORAL_TABLET | Freq: Every day | ORAL | Status: DC
  Administered 2017-04-02 – 2017-04-04 (×3): 90 mg via ORAL
  Filled 2017-04-02 (×3): qty 1

## 2017-04-02 MED ORDER — ASPIRIN 81 MG PO CHEW
81.0000 mg | CHEWABLE_TABLET | Freq: Every day | ORAL | Status: DC
  Administered 2017-04-02 – 2017-04-04 (×3): 81 mg via ORAL
  Filled 2017-04-02 (×4): qty 1

## 2017-04-02 MED ORDER — ACETAMINOPHEN 325 MG PO TABS
ORAL_TABLET | ORAL | Status: AC
  Administered 2017-04-02: 650 mg
  Filled 2017-04-02: qty 2

## 2017-04-02 MED ORDER — ONDANSETRON HCL 4 MG/2ML IJ SOLN
4.0000 mg | Freq: Four times a day (QID) | INTRAMUSCULAR | Status: DC | PRN
  Administered 2017-04-02 (×3): 4 mg via INTRAVENOUS
  Filled 2017-04-02 (×2): qty 2

## 2017-04-02 MED ORDER — NICARDIPINE HCL IN NACL 20-0.86 MG/200ML-% IV SOLN
INTRAVENOUS | Status: AC
  Administered 2017-04-02: 20 mg via INTRAVENOUS
  Filled 2017-04-02: qty 200

## 2017-04-02 MED ORDER — CINACALCET HCL 30 MG PO TABS
60.0000 mg | ORAL_TABLET | Freq: Every day | ORAL | Status: DC
  Administered 2017-04-02 – 2017-04-03 (×2): 60 mg via ORAL
  Filled 2017-04-02 (×3): qty 2

## 2017-04-02 MED ORDER — SODIUM CHLORIDE 0.9 % IV SOLN: 250.0000 mL | INTRAVENOUS | Status: DC | PRN

## 2017-04-02 MED ORDER — HYDRALAZINE HCL 50 MG PO TABS
100.0000 mg | ORAL_TABLET | Freq: Three times a day (TID) | ORAL | Status: DC
  Administered 2017-04-02 – 2017-04-04 (×8): 100 mg via ORAL
  Filled 2017-04-02 (×8): qty 2

## 2017-04-02 MED ORDER — HEPARIN SODIUM (PORCINE) 1000 UNIT/ML DIALYSIS
4600.0000 [IU] | Freq: Once | INTRAMUSCULAR | Status: DC
  Filled 2017-04-02: qty 5

## 2017-04-02 MED ORDER — ALTEPLASE 2 MG IJ SOLR: 2.0000 mg | Freq: Once | INTRAMUSCULAR | Status: DC | PRN

## 2017-04-02 MED ORDER — HEPARIN SODIUM (PORCINE) 5000 UNIT/ML IJ SOLN
5000.0000 [IU] | Freq: Three times a day (TID) | INTRAMUSCULAR | Status: DC
  Administered 2017-04-02 – 2017-04-03 (×3): 5000 [IU] via SUBCUTANEOUS
  Filled 2017-04-02 (×7): qty 1

## 2017-04-02 MED ORDER — CINACALCET HCL 30 MG PO TABS: 60.0000 mg | ORAL_TABLET | Freq: Every day | ORAL | Status: DC

## 2017-04-02 MED ORDER — DOXERCALCIFEROL 4 MCG/2ML IV SOLN
6.0000 ug | INTRAVENOUS | Status: DC
  Administered 2017-04-02: 6 ug via INTRAVENOUS
  Filled 2017-04-02 (×2): qty 4

## 2017-04-02 MED ORDER — NICARDIPINE HCL IN NACL 20-0.86 MG/200ML-% IV SOLN
3.0000 mg/h | INTRAVENOUS | Status: DC
  Administered 2017-04-02 (×2): 5 mg/h via INTRAVENOUS
  Administered 2017-04-02: 3 mg/h via INTRAVENOUS
  Administered 2017-04-03: 4 mg/h via INTRAVENOUS
  Administered 2017-04-03: 3 mg/h via INTRAVENOUS
  Administered 2017-04-03: 5 mg/h via INTRAVENOUS
  Filled 2017-04-02 (×4): qty 200
  Filled 2017-04-02: qty 400
  Filled 2017-04-02: qty 200

## 2017-04-02 MED ORDER — HYDRALAZINE HCL 20 MG/ML IJ SOLN
INTRAMUSCULAR | Status: AC
  Filled 2017-04-02: qty 1

## 2017-04-02 MED ORDER — FENTANYL CITRATE (PF) 100 MCG/2ML IJ SOLN
INTRAMUSCULAR | Status: AC
  Filled 2017-04-02: qty 2

## 2017-04-02 MED ORDER — SEVELAMER CARBONATE 800 MG PO TABS
2400.0000 mg | ORAL_TABLET | Freq: Three times a day (TID) | ORAL | Status: DC
  Administered 2017-04-02 – 2017-04-04 (×5): 2400 mg via ORAL
  Filled 2017-04-02 (×8): qty 3

## 2017-04-02 MED ORDER — LIDOCAINE HCL (PF) 1 % IJ SOLN: 5.0000 mL | INTRAMUSCULAR | Status: DC | PRN

## 2017-04-02 MED ORDER — FENTANYL CITRATE (PF) 100 MCG/2ML IJ SOLN
25.0000 ug | INTRAMUSCULAR | Status: DC | PRN
  Administered 2017-04-02 (×4): 100 ug via INTRAVENOUS
  Administered 2017-04-02: 50 ug via INTRAVENOUS
  Administered 2017-04-02: 100 ug via INTRAVENOUS
  Filled 2017-04-02 (×6): qty 2

## 2017-04-02 MED ORDER — CLONIDINE HCL 0.2 MG PO TABS
0.2000 mg | ORAL_TABLET | Freq: Three times a day (TID) | ORAL | Status: DC
  Administered 2017-04-02 – 2017-04-04 (×8): 0.2 mg via ORAL
  Filled 2017-04-02 (×8): qty 1

## 2017-04-02 MED ORDER — AMLODIPINE BESYLATE 10 MG PO TABS
10.0000 mg | ORAL_TABLET | Freq: Every day | ORAL | Status: DC
  Administered 2017-04-02 – 2017-04-04 (×3): 10 mg via ORAL
  Filled 2017-04-02 (×3): qty 1

## 2017-04-02 MED ORDER — LIDOCAINE-PRILOCAINE 2.5-2.5 % EX CREA: 1.0000 "application " | TOPICAL_CREAM | CUTANEOUS | Status: DC | PRN

## 2017-04-02 MED ORDER — SEVELAMER CARBONATE 800 MG PO TABS
800.0000 mg | ORAL_TABLET | ORAL | Status: DC | PRN
  Filled 2017-04-02: qty 1

## 2017-04-02 MED ORDER — LABETALOL HCL 200 MG PO TABS
400.0000 mg | ORAL_TABLET | Freq: Three times a day (TID) | ORAL | Status: DC
  Administered 2017-04-02 – 2017-04-04 (×8): 400 mg via ORAL
  Filled 2017-04-02 (×8): qty 2

## 2017-04-02 MED ORDER — NICARDIPINE HCL IN NACL 20-0.86 MG/200ML-% IV SOLN
3.0000 mg/h | Freq: Once | INTRAVENOUS | Status: AC
  Administered 2017-04-02: 5 mg/h via INTRAVENOUS
  Filled 2017-04-02: qty 200

## 2017-04-02 NOTE — Progress Notes (Signed)
eLink Physician-Brief Progress Note Patient Name: Jeremy GoldenDeanthony Huerta DOB: 1996/04/05 MRN: [id number]   Date of Service  04/02/2017  HPI/Events of Note  Nausea and vomiting.   eICU Interventions  Will order: 1. Zofran 4 mg IV Q 6 hours PRN N/V.     Intervention Category Major Interventions: Other:  Sommer,Steven Dennard Nipugene 04/02/2017, 6:05 AM

## 2017-04-02 NOTE — Progress Notes (Signed)
Name: Jeremy GoldenDeanthony Gheen MRN: [id number] DOB: [birth year]    ADMISSION DATE:  04/01/2017 CONSULTATION DATE:  04/01/17  REFERRING MD :  Rhunette CroftNanavati  CHIEF COMPLAINT:  Chest pain   HISTORY OF PRESENT ILLNESS:  Jeremy Huerta is a 21 y.o. male with a PMH of FSGS with resultant ESRD (T/Th/Sat - last full session Sat 11/10), HTN with multiple admissions for hypertensive urgency.  He was supposed to have HD Tues 11/13 but was unable to get a ride.  Per previous notes, he has had problems with compliance in the past.  [facility] with chest pain x 2 days.  He was apparently at work (works at [employer]) and he bent over and felt increased pain.  Coworkers thought he was going to fall out; therefore, they called EMS.  In ED, he was found to have hypertensive urgency.  He was given multiple oral agents with minimal improvement; therefore, was started on nicardipine.  PCCM was subsequently called to admit to the ICU.  Pt reports he has been taking all of his antihypertensives except for Imdur and Amlodipine because he has been out and supposedly had been calling to get refills but has not been able to get the refills prescribed.  He states his chest pain is improved at this time.  Earlier was a tightness like pain, non-radiating, no lightheadedness, no diaphoresis.  SUBJECTIVE:  Pt reports feeling terrible, tearful.  BP elevated.  States he feels like he has fever (98.3).  Nauseated.  RN reports she pulled 3L during HD.   VITAL SIGNS: Temp:  [98 F (36.7 C)-99.9 F (37.7 C)] 98 F (36.7 C) (11/14 1036) Pulse Rate:  [82-130] 96 (11/14 1300) Resp:  [15-31] 17 (11/14 1300) BP: (139-217)/(69-146) 167/110 (11/14 1300) SpO2:  [91 %-100 %] 99 % (11/14 1036) Weight:  [190 lb (86.2 kg)-195 lb 1.7 oz (88.5 kg)] 194 lb 14.2 oz (88.4 kg) (11/14 1036)  PHYSICAL EXAMINATION: General: ill appearing adult young adult male HEENT: MM pink/moist, tearful Neuro: AAOx4, speech clear, sat up during  interview and nodded off / almost leaned out of the bed CV: s1s2 rrr, no m/r/g PULM: even/non-labored, lungs bilaterally clear  YQ:MVHQGI:soft, non-tender, bsx4 active  Extremities: warm/dry, no edema, LFA AVF c/d/i  Skin: no rashes or lesions   Recent Labs  Lab 04/01/17 2249 04/02/17 0334  NA 135 140  K 6.1* 4.3  CL 104 104  CO2 17* 19*  BUN 78* 81*  CREATININE 23.72* 23.93*  GLUCOSE 81 75   Recent Labs  Lab 04/01/17 2249 04/02/17 0334  HGB 12.8* 13.8  HCT 39.9 42.5  WBC 8.1 11.3*  PLT 172 177   Dg Chest 2 View  Result Date: 04/01/2017 CLINICAL DATA:  Acute onset of generalized chest pain, shortness of breath, productive cough and lightheadedness. EXAM: CHEST  2 VIEW COMPARISON:  Chest radiograph performed 03/04/2017 FINDINGS: The lungs are well-aerated. Mild vascular congestion is noted. There is no evidence of focal opacification, pleural effusion or pneumothorax. The heart is mildly enlarged. No acute osseous abnormalities are seen. IMPRESSION: Mild vascular congestion and mild cardiomegaly. Lungs remain grossly clear. Electronically Signed   By: Roanna RaiderJeffery  Chang M.D.   On: 04/01/2017 23:31    STUDIES:  CXR 11/13 > mild congestion.  SIGNIFICANT EVENTS  11/13 Admit.  ASSESSMENT / PLAN:  Hypertensive urgency - pt reports he has been compliant with meds; however, he has been out of Imdur and Amlodpine for several days now.  Of note, he has  had multiple admissions for hypertensive urgency in the past (most recently just last month). Chest pain - presumed due to above. Plan: Continue home regimen > amlodipine, clonidine, hydralazine, Imdur, labetalol  Cardene gtt for SBP <160 Follow troponin, EKG He will need scripts for the following at discharge > amlodipine 10mg  daily, clonidine 0.2mg  TID, hydralazine 100mg  TID, imdur 90mg  TID, labetalol 400mg  TID PRN fentanyl [facility] as he states he doesn't have a primary  MD  Hyperkalemia - s/p temporizing measures in ED. ESRD (T/Th/Sat) - due to FSGS.  He reports he missed HD Tues 11/13 and last session was Sat 11/10. Plan: Nephrology following, appreciate input  Follow BMP   Best Practice: Dispo: ICU  Diet: Renal  DVT prophylaxis:  SCD's / heparin. Code status:  FULL.   Canary BrimBrandi Ollis, NP-C [facility] Pgr: [phone] or if no answer [phone] 04/02/2017, 1:36 PM  Attending Note:  21 year old male with FSGN and ESRD with very resistant hypertension who ran out of Imdur and amlodipine presenting with hypertensive urgency requiring cardene drip and dialysis.  Patient complains of a headache and chest pain.  BP currently is 175 systolic.  Patient had some nausea so was unable to swallow his medications.  Will restart cardene and call for dialysis ASAP.  PO meds to be given after norvasc.  Will hold in the ICU given current condition and need for cardene.  Encourage the PO medications.    The patient is critically ill with multiple organ systems failure and requires high complexity decision making for assessment and support, frequent evaluation and titration of therapies, application of advanced monitoring technologies and extensive interpretation of multiple databases.   Critical Care Time devoted to patient care services described in this note is  45  Minutes. This time reflects time of care of this signee Dr Koren BoundWesam Yacoub. This critical care time does not reflect procedure time, or teaching time or supervisory time of PA/NP/Med student/Med Resident etc but could involve care discussion time.  Alyson ReedyWesam G. Yacoub, M.D. [facility]. Pager: [phone]. After hours pager: [phone].

## 2017-04-02 NOTE — H&P (Signed)
Name: Jeremy GoldenDeanthony Calip MRN: [id number] DOB: [birth year]    ADMISSION DATE:  04/01/2017 CONSULTATION DATE:  04/01/17  REFERRING MD :  Rhunette CroftNanavati  CHIEF COMPLAINT:  Chest pain   HISTORY OF PRESENT ILLNESS:  Jeremy Huerta is a 21 y.o. male with a PMH as outlined below including FSGS with resultant ESRD (T/Th/Sat - last full session Sat 11/10), HTN with multiple admissions for hypertensive urgency.  He was supposed to have HD Tues 11/13 but was unable to get a ride.  Per previous notes, he has had problems with compliance in the past.  [facility]/13 with chest pain x 2 days.  He was apparently at work (works at [employer]) and he bent over and felt increased pain.  Coworkers thought he was going to fall out; therefore, they called EMS.  In ED, he was found to have hypertensive urgency.  He was given multiple oral agents with minimal improvement; therefore, was started on nicardipine.  PCCM was subsequently called to admit to the ICU.  Pt reports he has been taking all of his antihypertensives except for Imdur and Amlodipine because he has been out and supposedly had been calling to get refills but has not been able to get the refills prescribed.  He states his chest pain is improved at this time.  Earlier was a tightness like pain, non-radiating, no lightheadedness, no diaphoresis.    PAST MEDICAL HISTORY :   has a past medical history of Acute respiratory failure (HCC), ESRD (end stage renal disease) (HCC), FSGS (focal segmental glomerulosclerosis), Hypertension, and Nausea & vomiting (11/2016).  has a past surgical history that includes Renal biopsy (2013) and AV fistula placement (12/2015). Prior to Admission medications   Medication Sig Start Date End Date Taking? Authorizing Provider  amLODipine (NORVASC) 10 MG tablet Take 10 mg by mouth daily.  07/15/14  Yes [provider]  aspirin EC 81 MG EC tablet Take 1 tablet (81 mg total) by mouth daily. 01/16/17  Yes Kathlen ModyAkula, Vijaya,  MD  B Complex-C-Folic Acid (B COMPLEX-VITAMIN C-FOLIC ACID) 1 MG tablet Take 1 tablet by mouth daily. 04/30/16  Yes [provider]  cinacalcet (SENSIPAR) 30 MG tablet Take 2 tablets (60 mg total) by mouth daily with supper. 12/04/16  Yes Rolly SalterPatel, Pranav M, MD  cloNIDine (CATAPRES) 0.2 MG tablet Take 1 tablet (0.2 mg total) by mouth 3 (three) times daily. 12/27/16  Yes Drema DallasWoods, Curtis J, MD  hydrALAZINE (APRESOLINE) 100 MG tablet Take 100 mg by mouth 3 (three) times daily.  04/13/16  Yes [provider]  HYDROcodone-acetaminophen (NORCO/VICODIN) 5-325 MG tablet Take 1-2 tablets by mouth every 6 (six) hours as needed for severe pain. 03/06/17  Yes Narda BondsNettey, Ralph A, MD  isosorbide mononitrate (IMDUR) 30 MG 24 hr tablet Take 3 tablets (90 mg total) by mouth daily. 12/27/16  Yes Drema DallasWoods, Curtis J, MD  labetalol (NORMODYNE) 200 MG tablet Take 400 mg by mouth 3 (three) times daily. 03/16/16  Yes [provider]  methocarbamol (ROBAXIN) 500 MG tablet Take 1 tablet (500 mg total) by mouth every 8 (eight) hours as needed for muscle spasms. 03/05/17  Yes Narda BondsNettey, Ralph A, MD  Nutritional Supplements (FEEDING SUPPLEMENT, NEPRO CARB STEADY,) LIQD Take 237 mLs by mouth 2 (two) times daily between meals. 01/15/17  Yes Kathlen ModyAkula, Vijaya, MD  ondansetron (ZOFRAN ODT) 4 MG disintegrating tablet Take 1 tablet (4 mg total) by mouth every 8 (eight) hours as needed for nausea or vomiting. 12/02/16  Yes Ward, Marijean NiemannJaime  Pilcher, PA-C  oxymetazoline (AFRIN) 0.05 % nasal spray Place 1 spray into both nostrils 2 (two) times daily as needed (for nose bleed, soak cotton ball and hold with pressure with it in bleeding nare). 01/15/17  Yes Kathlen Mody, MD  sevelamer (RENAGEL) 800 MG tablet Take 800-2,400 mg 3 (three) times daily with meals by mouth. Three with meals and 1 with snacks   Yes [provider]   No Known Allergies  FAMILY HISTORY:  family history includes Diabetes in his father and paternal  grandmother; Hypertension in his father, maternal grandmother, mother, and paternal grandmother; Kidney disease in his father. SOCIAL HISTORY:  reports that  has never smoked. he has never used smokeless tobacco. He reports that he does not drink alcohol or use drugs.  REVIEW OF SYSTEMS:   All negative; except for those that are bolded, which indicate positives.  Constitutional: weight loss, weight gain, night sweats, fevers, chills, fatigue, weakness.  HEENT: headaches, sore throat, sneezing, nasal congestion, post nasal drip, difficulty swallowing, tooth/dental problems, visual complaints, visual changes, ear aches. Neuro: difficulty with speech, weakness, numbness, ataxia. CV:  chest pain, orthopnea, PND, swelling in lower extremities, dizziness, palpitations, syncope.  Resp: cough, hemoptysis, dyspnea, wheezing. GI: heartburn, indigestion, abdominal pain, nausea, vomiting, diarrhea, constipation, change in bowel habits, loss of appetite, hematemesis, melena, hematochezia.  GU: dysuria, change in color of urine, urgency or frequency, flank pain, hematuria. MSK: joint pain or swelling, decreased range of motion. Psych: change in mood or affect, depression, anxiety, suicidal ideations, homicidal ideations. Skin: rash, itching, bruising.    SUBJECTIVE: Chest pain improved but still there somewhat.  VITAL SIGNS: Temp:  [99.9 F (37.7 C)] 99.9 F (37.7 C) (11/13 2201) Pulse Rate:  [82-93] 82 (11/13 2345) Resp:  [18-21] 21 (11/13 2345) BP: (208-217)/(137-146) 212/137 (11/13 2345) SpO2:  [100 %] 100 % (11/13 2345) Weight:  [86.2 kg (190 lb)] 86.2 kg (190 lb) (11/13 2205)  PHYSICAL EXAMINATION: General: Young AA male, resting in bed watching TV, in NAD. Neuro: A&O x 3, non-focal.  HEENT: [place]/AT. PERRL, sclerae anicteric. Cardiovascular: RRR, no M/R/G.  Lungs: Respirations even and unlabored.  CTA bilaterally, No W/R/R.  Abdomen: BS x 4, soft, NT/ND.  Musculoskeletal: No gross  deformities, no edema.  Skin: Intact, warm, no rashes.    Recent Labs  Lab 04/01/17 2249  NA 135  K 6.1*  CL 104  CO2 17*  BUN 78*  CREATININE 23.72*  GLUCOSE 81   Recent Labs  Lab 04/01/17 2249  HGB 12.8*  HCT 39.9  WBC 8.1  PLT 172   Dg Chest 2 View  Result Date: 04/01/2017 CLINICAL DATA:  Acute onset of generalized chest pain, shortness of breath, productive cough and lightheadedness. EXAM: CHEST  2 VIEW COMPARISON:  Chest radiograph performed 03/04/2017 FINDINGS: The lungs are well-aerated. Mild vascular congestion is noted. There is no evidence of focal opacification, pleural effusion or pneumothorax. The heart is mildly enlarged. No acute osseous abnormalities are seen. IMPRESSION: Mild vascular congestion and mild cardiomegaly. Lungs remain grossly clear. Electronically Signed   By: Roanna Raider M.D.   On: 04/01/2017 23:31    STUDIES:  CXR 11/13 > mild congestion.  SIGNIFICANT EVENTS  11/13 > admit.  ASSESSMENT / PLAN:  Hypertensive urgency - pt reports he has been compliant with meds; however, he has been out of Imdur and Amlodpine for several days now.  Of note, he has had multiple admissions for hypertensive urgency in the past (most recently just last  month). Chest pain - presumed due to above. Plan: Continue preadmission antihypertensive regimen, need to ensure he has all meds prescribed on discharge with refills in hand (amlodipine 10mg  daily, clonidine 0.2mg  TID, hydralazine 100mg  TID, imdur 90mg  TID, labetalol 400mg  TID). Continue cardene gtt for now, anticipate he will be off of this in a few hours and can be maintained on oral regimen. Goal SBP ~ 160 - 170 initially. Trend troponins. Repeat EKG. Fentanyl PRN.  Hyperkalemia - s/p temporizing measures in ED. ESRD (T/Th/Sat) - due to FSGS.  He reports he missed HD Tues 11/13 and last session was Sat 11/10. Plan: Nephrology consulted by EDP. Repeat BMP at 0145 - might need urgent HD due to  hyperkalemia.  Best Practice: Dispo: ICU. Diet: Renal. DVT prophylaxis:  SCD's / heparin. Code status:  FULL.    Rutherford Guysahul Graciela Plato, [place]PA - C [place] [facility] Pager: [phone]  or [phone] 04/02/2017, 1:03 AM

## 2017-04-02 NOTE — Progress Notes (Signed)
Have called phlebotomy multiple times today regarding troponin level due at 1700. Last two have been critical. Will keep calling. Huntley EstelleLewis, Maliek Schellhorn E, RN 04/02/2017 1745

## 2017-04-02 NOTE — Progress Notes (Signed)
Patient arrived back from HD. BP in the 180s, stating he felt nauseous and extremely hot. Gave zofran and placed washcloths on patient's head and neck. Patient opened eyes and he could not focus on me. Mentioned incident to CCM and he stated that he would take a look at him. Tamsen MeekLewis, Bryndle Corredor E, RN 04/02/2017 2:36 PM

## 2017-04-02 NOTE — ED Provider Notes (Signed)
[facility] EMERGENCY DEPARTMENT Provider Note   CSN: [id number] Arrival date & time: 04/01/17  2153     History   Chief Complaint Chief Complaint  Patient presents with  . Chest Pain    HPI Jeremy Huerta is a 21 y.o. 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  He reports that he was scheduled to be dialyzed today, but missed his appointment.  He was last dialyzed on Saturday, but left 30 minutes prior to completion due to work.  He denies fever, chills, emesis, diarrhea, abdominal pain, headache, or decreased urine output.  No treatment prior to arrival.  He reports that he has been compliant with his home hypertension medication, but has not taken his evening doses yet.   The history is provided by the patient. No language interpreter was used.  Chest Pain   This is a recurrent problem. The current episode started yesterday. The problem occurs constantly. The problem has been rapidly worsening. The pain does not radiate. Duration of episode(s) is 1 day. Associated symptoms include back pain, nausea and shortness of breath. Pertinent negatives include no abdominal pain, no dizziness, no fever, no headaches, no numbness, no vomiting and no weakness. He has tried nothing for the symptoms. Risk factors include male gender (HD).  His past medical history is significant for hypertension.    Past Medical History:  Diagnosis Date  . Acute respiratory failure (HCC)   . ESRD (end stage renal disease) (HCC)    TTS  . FSGS (focal segmental glomerulosclerosis)   . Hypertension   . Nausea & vomiting 11/2016    Patient Active Problem List   Diagnosis Date Noted  . Skin abscess 03/07/2017  . 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 Back pain 03/05/2017  . Hypertensive emergency 01/13/2017  . History of anemia due to  chronic kidney disease 01/13/2017  . Secondary hyperparathyroidism (HCC) 01/13/2017  . Bleeding from the nose 01/13/2017  . Fever 01/13/2017  . Chest pain on breathing   . SOB (shortness of breath)   . Gastroesophageal reflux disease   . Respiratory failure (HCC) 12/24/2016  . Acute pulmonary edema (HCC)   . End-stage renal disease on hemodialysis (HCC)   . Hypertensive urgency 12/02/2016  . Leukocytosis 12/02/2016  . Nausea & vomiting 12/02/2016  . Hyperkalemia 12/02/2016  . Acute respiratory failure (HCC) 04/22/2016  . Diarrhea 04/22/2016  . HCAP (healthcare-associated pneumonia) 04/22/2016  . Tachycardia 04/22/2016  . Hypertension     Past Surgical History:  Procedure Laterality Date  . AV FISTULA PLACEMENT  12/2015  . RENAL BIOPSY  2013       Home Medications    Prior to Admission medications   Medication Sig Start Date End Date Taking? Authorizing Provider  amLODipine (NORVASC) 10 MG tablet Take 10 mg by mouth daily.  07/15/14  Yes [provider]  aspirin EC 81 MG EC tablet Take 1 tablet (81 mg total) by mouth daily. 01/16/17  Yes Kathlen Mody, MD  B Complex-C-Folic Acid (B COMPLEX-VITAMIN C-FOLIC ACID) 1 MG tablet Take 1 tablet by mouth daily. 04/30/16  Yes [provider]  cinacalcet (SENSIPAR) 30 MG tablet Take 2 tablets (60 mg total) by mouth daily with supper. 12/04/16  Yes Rolly Salter, MD  cloNIDine (CATAPRES) 0.2 MG tablet Take 1 tablet (0.2 mg total) by mouth  3 (three) times daily. 12/27/16  Yes Drema DallasWoods, Curtis J, MD  hydrALAZINE (APRESOLINE) 100 MG tablet Take 100 mg by mouth 3 (three) times daily.  04/13/16  Yes [provider]  HYDROcodone-acetaminophen (NORCO/VICODIN) 5-325 MG tablet Take 1-2 tablets by mouth every 6 (six) hours as needed for severe pain. 03/06/17  Yes Narda BondsNettey, Ralph A, MD  isosorbide mononitrate (IMDUR) 30 MG 24 hr tablet Take 3 tablets (90 mg total) by mouth daily. 12/27/16  Yes Drema DallasWoods, Curtis J, MD  labetalol  (NORMODYNE) 200 MG tablet Take 400 mg by mouth 3 (three) times daily. 03/16/16  Yes [provider]  methocarbamol (ROBAXIN) 500 MG tablet Take 1 tablet (500 mg total) by mouth every 8 (eight) hours as needed for muscle spasms. 03/05/17  Yes Narda BondsNettey, Ralph A, MD  Nutritional Supplements (FEEDING SUPPLEMENT, NEPRO CARB STEADY,) LIQD Take 237 mLs by mouth 2 (two) times daily between meals. 01/15/17  Yes Kathlen ModyAkula, Vijaya, MD  ondansetron (ZOFRAN ODT) 4 MG disintegrating tablet Take 1 tablet (4 mg total) by mouth every 8 (eight) hours as needed for nausea or vomiting. 12/02/16  Yes Ward, Chase PicketJaime Pilcher, PA-C  oxymetazoline (AFRIN) 0.05 % nasal spray Place 1 spray into both nostrils 2 (two) times daily as needed (for nose bleed, soak cotton ball and hold with pressure with it in bleeding nare). 01/15/17  Yes Kathlen ModyAkula, Vijaya, MD  sevelamer (RENAGEL) 800 MG tablet Take 800-2,400 mg 3 (three) times daily with meals by mouth. Three with meals and 1 with snacks   Yes [provider]    Family History Family History  Problem Relation Age of Onset  . Hypertension Mother   . Diabetes Father   . Hypertension Father   . Kidney disease Father   . Hypertension Maternal Grandmother   . Diabetes Paternal Grandmother   . Hypertension Paternal Grandmother     Social History Social History   Tobacco Use  . Smoking status: Never Smoker  . Smokeless tobacco: Never Used  Substance Use Topics  . Alcohol use: No  . Drug use: No     Allergies   Patient has no known allergies.   Review of Systems Review of Systems  Constitutional: Negative for activity change, chills and fever.  Respiratory: Positive for shortness of breath.   Cardiovascular: Positive for chest pain.  Gastrointestinal: Positive for nausea. Negative for abdominal pain, diarrhea and vomiting.  Genitourinary: Negative for decreased urine volume.  Musculoskeletal: Positive for back pain. Negative for neck pain.  Skin: Negative for  color change, pallor and rash.  Neurological: Negative for dizziness, weakness, numbness and headaches.   Physical Exam Updated Vital Signs BP (!) 215/144   Pulse 100   Temp 99.9 F (37.7 C) (Oral)   Resp (!) 23   Ht 6' (1.829 m)   Wt 86.2 kg (190 lb)   SpO2 100%   BMI 25.77 kg/m   Physical Exam  Constitutional: He is oriented to person, place, and time. He appears well-developed.  Uncomfortable appearing  HENT:  Head: Normocephalic.  Eyes: Conjunctivae are normal.  Neck: Neck supple.  Cardiovascular: Normal rate and regular rhythm.  No murmur heard. Pulmonary/Chest: Effort normal. No stridor. He has no wheezes. He has no rales.  Increased work of breathing  Abdominal: Soft. He exhibits no distension and no mass. There is tenderness. There is no rebound and no guarding. No hernia.  Generalized tenderness to palpation over the abdomen.  No rebound or guarding.  Musculoskeletal: Normal range of motion. He  exhibits tenderness. He exhibits no edema or deformity.  Tender to palpation over the spinous processes of the lumbar spine with surrounding bilateral paraspinal muscle tenderness.  No tenderness to palpation over the cervical or thoracic lumbar spinous processes.  Neurological: He is alert and oriented to person, place, and time.  Skin: Skin is warm and dry.  Psychiatric: His behavior is normal.  Nursing note and vitals reviewed.   ED Treatments / Results  Labs (all labs ordered are listed, but only abnormal results are displayed) Labs Reviewed  BASIC METABOLIC PANEL - Abnormal; Notable for the following components:      Result Value   Potassium 6.1 (*)    CO2 17 (*)    BUN 78 (*)    Creatinine, Ser 23.72 (*)    GFR calc non Af Amer 2 (*)    GFR calc Af Amer 3 (*)    All other components within normal limits  CBC - Abnormal; Notable for the following components:   Hemoglobin 12.8 (*)    RDW 17.1 (*)    All other components within normal limits  BASIC METABOLIC  PANEL  CBC  BASIC METABOLIC PANEL  MAGNESIUM  PHOSPHORUS  TROPONIN I  TROPONIN I  I-STAT TROPONIN, ED  CBG MONITORING, ED    EKG  EKG Interpretation  Date/Time:  Tuesday April 01 2017 23:01:36 EST Ventricular Rate:  90 PR Interval:    QRS Duration: 81 QT Interval:  356 QTC Calculation: 436 R Axis:   83 Text Interpretation:  Sinus rhythm Borderline T abnormalities, lateral leads ST elev, probable normal early repol pattern peaked t waves inferior T waves now upright Reconfirmed by Derwood Kaplan [phone]) on 04/01/2017 11:53:32 PM       Radiology Dg Chest 2 View  Result Date: 04/01/2017 CLINICAL DATA:  Acute onset of generalized chest pain, shortness of breath, productive cough and lightheadedness. EXAM: CHEST  2 VIEW COMPARISON:  Chest radiograph performed 03/04/2017 FINDINGS: The lungs are well-aerated. Mild vascular congestion is noted. There is no evidence of focal opacification, pleural effusion or pneumothorax. The heart is mildly enlarged. No acute osseous abnormalities are seen. IMPRESSION: Mild vascular congestion and mild cardiomegaly. Lungs remain grossly clear. Electronically Signed   By: Roanna Raider M.D.   On: 04/01/2017 23:31    Procedures Procedures (including critical care time)  Medications Ordered in ED Medications  sodium polystyrene (KAYEXALATE) 15 GM/60ML suspension 50 g (not administered)  hydrALAZINE (APRESOLINE) tablet 100 mg (0 mg Oral Hold 04/02/17 0028)  cloNIDine (CATAPRES) tablet 0.3 mg (0 mg Oral Hold 04/02/17 0028)  amLODipine (NORVASC) tablet 10 mg (not administered)  isosorbide mononitrate (IMDUR) 24 hr tablet 90 mg (not administered)  cloNIDine (CATAPRES) tablet 0.2 mg (not administered)  aspirin chewable tablet 81 mg (not administered)  hydrALAZINE (APRESOLINE) tablet 100 mg (not administered)  labetalol (NORMODYNE) tablet 400 mg (not administered)  heparin injection 5,000 Units (not administered)  0.9 %  sodium chloride infusion  (not administered)  fentaNYL (SUBLIMAZE) injection 25-100 mcg (50 mcg Intravenous Given 04/02/17 0136)  hydrALAZINE (APRESOLINE) injection 10 mg (10 mg Intravenous Given 04/01/17 2344)  HYDROmorphone (DILAUDID) injection 1 mg (1 mg Intravenous Given 04/01/17 2347)  calcium gluconate 1 g in sodium chloride 0.9 % 100 mL IVPB (1 g Intravenous New Bag/Given 04/02/17 0103)  sodium bicarbonate injection 50 mEq (50 mEq Intravenous Given 04/02/17 0103)  insulin aspart (novoLOG) injection 6 Units (6 Units Intravenous Given 04/02/17 0104)  dextrose 50 % solution 50 mL (50 mLs  Intravenous Given 04/02/17 0105)  nicardipine (CARDENE) 20mg  in 0.86% saline 200ml IV infusion (0.1 mg/ml) (5 mg/hr Intravenous New Bag/Given 04/02/17 0137)     Initial Impression / Assessment and Plan / ED Course  I have reviewed the triage vital signs and the nursing notes.  Pertinent labs & imaging results that were available during my care of the patient were reviewed by me and considered in my medical decision making (see chart for details).     21 year old male presenting with back pain, dyspnea, and chest pain for 1 day. He missed HD yesterday and left 30 minutes earlier 3 days ago. BP 208/146 on arrival. Cr 23.72. K 6.1. CXR with mild vascular congestion and mild cardiomegaly. Consulted Dr. Darrick Pennaeterding with nephrology who recommended AM dialysis.  The patient was seen and evaluated with Dr. Rhunette CroftNanavati, attending physician.  Hydralazine given in the ED with no change in the patient's BP.  Kayexalate, sodium bicarb, insulin aspart, heparin, and dextrose 50%, clonidine, and calcium gluconate given.  Consulted critical care, spoke with Dr. Levada SchillingSummers, who will admit the patient to the ICU and recommended starting the patient on a nicardipine drip with a SBP goal of 180-190 and MAP of 120.   Spoke with nursing staff and will hold the patient's p.m. medications at this time while the drip is started. On recheck, BP improved to 173/116  with a MAP of 132 and the patient is much more comfortable appearing. The patient appears reasonably stabilized for admission considering the current resources, flow, and capabilities available in the ED at this time, and [facility] further screening and/or treatment in the ED prior to admission.   Final Clinical Impressions(s) / ED Diagnoses   Final diagnoses:  Hypertensive urgency    ED Discharge Orders    None       Barkley BoardsMcDonald, Lavaughn Bisig A, PA-C 04/02/17 Donnamarie [id number]    Nanavati, Ankit, MD 04/02/17 1234

## 2017-04-02 NOTE — Progress Notes (Signed)
Pt had hypertension during HD treatment,Pt c/o headache.  Alonna BucklerBrown, Rita NP notified. Order received as follow: Tylenol 650 mg po, Hydralazine 10 mg iv given.Pt still feels not good. Dr. Arlean HoppingSchertz notified and order received as follow: teminated treatment and send pt back to his room.Report given to Huntley EstelleLewis, April RN.

## 2017-04-02 NOTE — Consult Note (Signed)
[facility] Renal Consultation Note    Indication for Consultation:  Management of ESRD/hemodialysis; anemia, hypertension/volume and secondary hyperparathyroidism PCP:  HPI: Jeremy Huerta is a 21 y.o. male with ESRD 2/2 FSGS on hemodialysis since 2016. PMH of hypertension, GERD, H/O R fibula Fx, H/O acute pancreatitis, AOCD, SHPT. Patient presented to ED with 2 days C/O chest pain. Patient missed HD 04/01/17 shortened tx 11/10 missed HD 03/27/17. He was at work yesterday and co-workers called 911 D/T increased chest pain and fear that he was going to have syncopal episode. Upon arrival to ED, he was found to have hypertensive urgency. He started on cardene gtt in ED. CXR shows mild vascular congestion and mild cardiomegaly.  EKG showed SR with non-specific T waves changes. Troponin trend flat. Labs unremarkable. He was admitted to MICU for hypertensive urgency.   He is currently being seen on hemodialysis. He C/O headache, denies chest pain, SOB, N, V,D, fever, chills, malaise, abdominal pain, bloody or tarry stools, no hearing or vision changes.  Says he is missing HD D/T transportation issues.   Past Medical History:  Diagnosis Date  . Acute respiratory failure (HCC)   . ESRD (end stage renal disease) (HCC)    TTS  . FSGS (focal segmental glomerulosclerosis)   . Hypertension   . Nausea & vomiting 11/2016   Past Surgical History:  Procedure Laterality Date  . AV FISTULA PLACEMENT  12/2015  . RENAL BIOPSY  2013   Family History  Problem Relation Age of Onset  . Hypertension Mother   . Diabetes Father   . Hypertension Father   . Kidney disease Father   . Hypertension Maternal Grandmother   . Diabetes Paternal Grandmother   . Hypertension Paternal Grandmother    Social History:  reports that  has never smoked. he has never used smokeless tobacco. He reports that he does not drink alcohol or use drugs. No Known Allergies Prior to Admission medications    Medication Sig Start Date End Date Taking? Authorizing Provider  amLODipine (NORVASC) 10 MG tablet Take 10 mg by mouth daily.  07/15/14  Yes [provider]  aspirin EC 81 MG EC tablet Take 1 tablet (81 mg total) by mouth daily. 01/16/17  Yes Kathlen ModyAkula, Vijaya, MD  B Complex-C-Folic Acid (B COMPLEX-VITAMIN C-FOLIC ACID) 1 MG tablet Take 1 tablet by mouth daily. 04/30/16  Yes [provider]  cinacalcet (SENSIPAR) 30 MG tablet Take 2 tablets (60 mg total) by mouth daily with supper. 12/04/16  Yes Rolly SalterPatel, Pranav M, MD  cloNIDine (CATAPRES) 0.2 MG tablet Take 1 tablet (0.2 mg total) by mouth 3 (three) times daily. 12/27/16  Yes Drema DallasWoods, Curtis J, MD  hydrALAZINE (APRESOLINE) 100 MG tablet Take 100 mg by mouth 3 (three) times daily.  04/13/16  Yes [provider]  HYDROcodone-acetaminophen (NORCO/VICODIN) 5-325 MG tablet Take 1-2 tablets by mouth every 6 (six) hours as needed for severe pain. 03/06/17  Yes Narda BondsNettey, Ralph A, MD  isosorbide mononitrate (IMDUR) 30 MG 24 hr tablet Take 3 tablets (90 mg total) by mouth daily. 12/27/16  Yes Drema DallasWoods, Curtis J, MD  labetalol (NORMODYNE) 200 MG tablet Take 400 mg by mouth 3 (three) times daily. 03/16/16  Yes [provider]  methocarbamol (ROBAXIN) 500 MG tablet Take 1 tablet (500 mg total) by mouth every 8 (eight) hours as needed for muscle spasms. 03/05/17  Yes Narda BondsNettey, Ralph A, MD  Nutritional Supplements (FEEDING SUPPLEMENT, NEPRO CARB STEADY,) LIQD Take 237 mLs by mouth 2 (two)  times daily between meals. 01/15/17  Yes Kathlen Mody, MD  ondansetron (ZOFRAN ODT) 4 MG disintegrating tablet Take 1 tablet (4 mg total) by mouth every 8 (eight) hours as needed for nausea or vomiting. 12/02/16  Yes Ward, Chase Picket, PA-C  oxymetazoline (AFRIN) 0.05 % nasal spray Place 1 spray into both nostrils 2 (two) times daily as needed (for nose bleed, soak cotton ball and hold with pressure with it in bleeding nare). 01/15/17  Yes Kathlen Mody, MD   sevelamer (RENAGEL) 800 MG tablet Take 800-2,400 mg 3 (three) times daily with meals by mouth. Three with meals and 1 with snacks   Yes [provider]   Current Facility-Administered Medications  Medication Dose Route Frequency Provider Last Rate Last Dose  . 0.9 %  sodium chloride infusion  250 mL Intravenous PRN Desai, Rahul P, PA-C      . 0.9 %  sodium chloride infusion  100 mL Intravenous PRN Pola Corn, NP      . 0.9 %  sodium chloride infusion  100 mL Intravenous PRN Pola Corn, NP      . alteplase (CATHFLO ACTIVASE) injection 2 mg  2 mg Intracatheter Once PRN Pola Corn, NP      . amLODipine (NORVASC) tablet 10 mg  10 mg Oral Daily Desai, Rahul P, PA-C   10 mg at 04/02/17 0949  . aspirin chewable tablet 81 mg  81 mg Oral Daily Desai, Rahul P, PA-C   81 mg at 04/02/17 0949  . cloNIDine (CATAPRES) tablet 0.2 mg  0.2 mg Oral Q8H Desai, Rahul P, PA-C   0.2 mg at 04/02/17 0426  . cloNIDine (CATAPRES) tablet 0.3 mg  0.3 mg Oral Once Derwood Kaplan, MD   Stopped at 04/02/17 0028  . doxercalciferol (HECTOROL) injection 6 mcg  6 mcg Intravenous Q T,Th,Sa-HD Pola Corn, NP   6 mcg at 04/02/17 0949  . fentaNYL (SUBLIMAZE) injection 25-100 mcg  25-100 mcg Intravenous Q2H PRN Kathlene Cote, PA-C   100 mcg at 04/02/17 1106  . heparin injection 1,000 Units  1,000 Units Dialysis PRN Pola Corn, NP      . heparin injection 4,600 Units  4,600 Units Dialysis Once in dialysis Pola Corn, NP      . heparin injection 5,000 Units  5,000 Units Subcutaneous Q8H Desai, Rahul P, PA-C      . hydrALAZINE (APRESOLINE) tablet 100 mg  100 mg Oral Once Derwood Kaplan, MD   Stopped at 04/02/17 0028  . hydrALAZINE (APRESOLINE) tablet 100 mg  100 mg Oral Q8H Desai, Rahul P, PA-C   100 mg at 04/02/17 0428  . isosorbide mononitrate (IMDUR) 24 hr tablet 90 mg  90 mg Oral Daily Desai, Rahul P, PA-C   90 mg at 04/02/17 0731  . labetalol (NORMODYNE) tablet  400 mg  400 mg Oral Q8H Desai, Rahul P, PA-C   400 mg at 04/02/17 0428  . lidocaine (PF) (XYLOCAINE) 1 % injection 5 mL  5 mL Intradermal PRN Pola Corn, NP      . lidocaine-prilocaine (EMLA) cream 1 application  1 application Topical PRN Pola Corn, NP      . nicardipine (CARDENE) 20mg  in 0.86% saline IV infusion (0.1 mg/ml)  3-15 mg/hr Intravenous Continuous Desai, Rahul P, PA-C   Stopped at 04/02/17 0951  . ondansetron (ZOFRAN) 4 MG/2ML injection           . ondansetron (ZOFRAN) injection 4 mg  4 mg Intravenous Q6H PRN Karl Ito, MD   4 mg at 04/02/17 0606  . pentafluoroprop-tetrafluoroeth (GEBAUERS) aerosol 1 application  1 application Topical PRN Pola Corn, NP       Labs: Basic Metabolic Panel: Recent Labs  Lab 04/01/17 2249 04/02/17 0334  NA 135 140  K 6.1* 4.3  CL 104 104  CO2 17* 19*  GLUCOSE 81 75  BUN 78* 81*  CREATININE 23.72* 23.93*  CALCIUM 9.4 10.0  PHOS  --  5.1*   Liver Function Tests: No results for input(s): AST, ALT, ALKPHOS, BILITOT, PROT, ALBUMIN in the last 168 hours. No results for input(s): LIPASE, AMYLASE in the last 168 hours. No results for input(s): AMMONIA in the last 168 hours. CBC: Recent Labs  Lab 04/01/17 2249 04/02/17 0334  WBC 8.1 11.3*  HGB 12.8* 13.8  HCT 39.9 42.5  MCV 89.9 89.3  PLT 172 177   Cardiac Enzymes: Recent Labs  Lab 04/02/17 0334 04/02/17 1052  TROPONINI 0.09* 0.10*   CBG: Recent Labs  Lab 04/02/17 0040  GLUCAP 78   Iron Studies: No results for input(s): IRON, TIBC, TRANSFERRIN, FERRITIN in the last 72 hours. Studies/Results: Dg Chest 2 View  Result Date: 04/01/2017 CLINICAL DATA:  Acute onset of generalized chest pain, shortness of breath, productive cough and lightheadedness. EXAM: CHEST  2 VIEW COMPARISON:  Chest radiograph performed 03/04/2017 FINDINGS: The lungs are well-aerated. Mild vascular congestion is noted. There is no evidence of focal opacification,  pleural effusion or pneumothorax. The heart is mildly enlarged. No acute osseous abnormalities are seen. IMPRESSION: Mild vascular congestion and mild cardiomegaly. Lungs remain grossly clear. Electronically Signed   By: Roanna Raider M.D.   On: 04/01/2017 23:31    ROS: As per HPI otherwise negative.   Physical Exam: Vitals:   04/02/17 1130 04/02/17 1200 04/02/17 1230 04/02/17 1300  BP: (!) 164/89 (!) 164/96 (!) (P) 168/107 (!) 167/110  Pulse: 88 90  96  Resp: 19 20  17   Temp:      TempSrc:      SpO2:      Weight:      Height:         General: Well developed, well nourished, in no acute distress. Head: Normocephalic, atraumatic, sclera non-icteric, mucus membranes are moist Neck: Supple. JVD not elevated. Lungs: Clear bilaterally to auscultation without wheezes, rales, or rhonchi. Breathing is unlabored. Heart: RRR with S1 S2. No murmurs, rubs, or gallops appreciated. Abdomen: Soft, non-tender, non-distended with normoactive bowel sounds. No rebound/guarding. No obvious abdominal masses. M-S:  Strength and tone appear normal for age. Lower extremities:without edema or ischemic changes, no open wounds  Neuro: Alert and oriented X 3. Moves all extremities spontaneously. Psych:  Responds to questions appropriately with a normal affect. Dialysis Access: LFA AVF + bruit  Dialysis Orders: T,Th,S [facility] 4 hrs 180 NRe 400/800 84.5 kg 2.0 K/2.25 Ca UF Profile 2 Heparin 4600 units IV TIW Hectorol 6 mcg IV TIW   BMD meds: Renagel 800 mg 3 tabs PO TID A/C 2 with snacks Sensipar 60 mg PO q PM  Assessment/Plan: 1.  Hypertensive urgency: Now off cardene gtt. Remains HTN, use hydralazine PRN. Home medications have been resumed. Per primary.  2.  ESRD -  T,Th,S Noncompliant with HD. Early S/Os, missed treatments. HD today off schedule, short tx tomorrow to get back on schedule. K+4.3. 2.0 K bath with usual heparin dose.  3.  Hypertension/volume  -As noted in #1. Multiple antihypertensive  medications. Questionable compliance. Pre wt 88.4 kg UFG 4.5.  4.  Anemia  - HGB 13.8 No ESA needed.  5.  Metabolic bone disease - VDRA, binders, sensipar 6.  Nutrition -renal/carb mod diet. Renal vit.   Rita H. Manson PasseyBrown, NP-C 04/02/2017, 1:33 PM  [facility] Beeper [phone]  Pt seen, examined and agree w A/P as above. esrd pt with HTN urgency, missing dialysis, reportedly transport issues.  Will disc when he is feeling better.  Vinson Moselleob Shaden Higley MD [facility] pager [phone]   04/02/2017, 2:24 PM

## 2017-04-02 NOTE — Procedures (Signed)
   I was present at this dialysis session, have reviewed the session itself and made  appropriate changes Vinson Moselleob Ritha Sampedro MD [facility] pager [phone]   04/02/2017, 1:20 PM

## 2017-04-03 ENCOUNTER — Inpatient Hospital Stay (HOSPITAL_COMMUNITY): Payer: Medicaid Other

## 2017-04-03 DIAGNOSIS — Z992 Dependence on renal dialysis: Secondary | ICD-10-CM

## 2017-04-03 DIAGNOSIS — N186 End stage renal disease: Secondary | ICD-10-CM

## 2017-04-03 DIAGNOSIS — I161 Hypertensive emergency: Principal | ICD-10-CM

## 2017-04-03 LAB — BASIC METABOLIC PANEL
Anion gap: 14 (ref 5–15)
BUN: 45 mg/dL — AB (ref 6–20)
CO2: 26 mmol/L (ref 22–32)
CREATININE: 17 mg/dL — AB (ref 0.61–1.24)
Calcium: 8.8 mg/dL — ABNORMAL LOW (ref 8.9–10.3)
Chloride: 99 mmol/L — ABNORMAL LOW (ref 101–111)
GFR calc Af Amer: 4 mL/min — ABNORMAL LOW (ref >60)
GFR, EST NON AFRICAN AMERICAN: 3 mL/min — AB (ref >60)
GLUCOSE: 88 mg/dL (ref 65–99)
POTASSIUM: 4.3 mmol/L (ref 3.5–5.1)
SODIUM: 139 mmol/L (ref 135–145)

## 2017-04-03 LAB — CBC
HCT: 39.2 % (ref 39.0–52.0)
Hemoglobin: 12.4 g/dL — ABNORMAL LOW (ref 13.0–17.0)
MCH: 28.2 pg (ref 26.0–34.0)
MCHC: 31.6 g/dL (ref 30.0–36.0)
MCV: 89.1 fL (ref 78.0–100.0)
PLATELETS: 182 10*3/uL (ref 150–400)
RBC: 4.4 MIL/uL (ref 4.22–5.81)
RDW: 17.2 % — ABNORMAL HIGH (ref 11.5–15.5)
WBC: 9.1 10*3/uL (ref 4.0–10.5)

## 2017-04-03 LAB — PHOSPHORUS: Phosphorus: 6.3 mg/dL — ABNORMAL HIGH (ref 2.5–4.6)

## 2017-04-03 LAB — MAGNESIUM: MAGNESIUM: 2.1 mg/dL (ref 1.7–2.4)

## 2017-04-03 MED ORDER — MORPHINE SULFATE (PF) 2 MG/ML IV SOLN
1.0000 mg | INTRAVENOUS | Status: DC | PRN
Start: 2017-04-03 — End: 2017-04-03
  Administered 2017-04-03: 1 mg via INTRAVENOUS
  Filled 2017-04-03: qty 1

## 2017-04-03 MED ORDER — FLUTICASONE PROPIONATE 50 MCG/ACT NA SUSP
1.0000 | Freq: Every day | NASAL | Status: DC
  Administered 2017-04-03 – 2017-04-04 (×2): 1 via NASAL
  Filled 2017-04-03: qty 16

## 2017-04-03 MED ORDER — METHOCARBAMOL 500 MG PO TABS
500.0000 mg | ORAL_TABLET | Freq: Three times a day (TID) | ORAL | Status: DC | PRN
  Administered 2017-04-03: 500 mg via ORAL
  Filled 2017-04-03 (×2): qty 1

## 2017-04-03 MED ORDER — SALINE SPRAY 0.65 % NA SOLN
2.0000 | Freq: Two times a day (BID) | NASAL | Status: DC
  Administered 2017-04-03 – 2017-04-04 (×3): 2 via NASAL
  Filled 2017-04-03 (×2): qty 44

## 2017-04-03 MED ORDER — HYDROCODONE-ACETAMINOPHEN 5-325 MG PO TABS
1.0000 | ORAL_TABLET | Freq: Four times a day (QID) | ORAL | Status: DC | PRN
  Administered 2017-04-03: 1 via ORAL
  Administered 2017-04-03 (×2): 2 via ORAL
  Filled 2017-04-03: qty 2
  Filled 2017-04-03: qty 1
  Filled 2017-04-03: qty 2

## 2017-04-03 NOTE — Progress Notes (Signed)
[facility] Progress Note  Subjective: feels better today, some HA's but thinks its from his nasal congestion.  Back pain better.  On cardene gtt now. Keeping his pills down today, not so well yest per pt.   Vitals:   04/03/17 1145 04/03/17 1200 04/03/17 1215 04/03/17 1300  BP: (!) 161/82 (!) 158/89 (!) 167/93 (!) 160/96  Pulse: (!) 101 99 97 91  Resp: (!) 21 (!) 24 19 16   Temp:      TempSrc:      SpO2: 94% 93% 95% 94%  Weight:      Height:        Inpatient medications: . amLODipine  10 mg Oral Daily  . aspirin  81 mg Oral Daily  . cinacalcet  60 mg Oral Q supper  . cloNIDine  0.2 mg Oral Q8H  . doxercalciferol  6 mcg Intravenous Q T,Th,Sa-HD  . fluticasone  1 spray Each Nare Daily  . heparin  5,000 Units Subcutaneous Q8H  . hydrALAZINE  100 mg Oral Q8H  . isosorbide mononitrate  90 mg Oral Daily  . labetalol  400 mg Oral Q8H  . sevelamer carbonate  2,400 mg Oral TID WC  . sodium chloride  2 spray Each Nare BID   . sodium chloride    . niCARDipine 5 mg/hr (04/03/17 1333)   sodium chloride, HYDROcodone-acetaminophen, methocarbamol, ondansetron (ZOFRAN) IV, sevelamer carbonate  Exam: Alert, no distress No jvd Chest clear bilat RRR no mrg Abd soft ntnd no ascites No LE edema LFA AVF+bruit NF, Ox 3  Dialysis: TTS NW 4h  84.5kg   2/2.25 bath  Hep 4600   LFA AVF -Hectorol 6 mcg IV TIW  -renagel 800 mg 3 tabs PO TID A/C 2 with snacks -Sensipar 60 mg PO q PM       Impression: 1. HTN urgency 2. ESRD TTS 3. HTN on cardene, po meds 4. Anemia no esa needed 5. MBD cont meds  Plan - HD tomorrow off schedule, UF to dry wt   Vinson Moselleob Jamere Stidham MD [facility] pager [phone]   04/03/2017, 2:23 PM   Recent Labs  Lab 04/01/17 2249 04/02/17 0334 04/03/17 0324  NA 135 140 139  K 6.1* 4.3 4.3  CL 104 104 99*  CO2 17* 19* 26  GLUCOSE 81 75 88  BUN 78* 81* 45*  CREATININE 23.72* 23.93* 17.00*  CALCIUM 9.4 10.0 8.8*  PHOS  --  5.1*  6.3*   No results for input(s): AST, ALT, ALKPHOS, BILITOT, PROT, ALBUMIN in the last 168 hours. Recent Labs  Lab 04/01/17 2249 04/02/17 0334 04/03/17 0324  WBC 8.1 11.3* 9.1  HGB 12.8* 13.8 12.4*  HCT 39.9 42.5 39.2  MCV 89.9 89.3 89.1  PLT 172 177 182   Iron/TIBC/Ferritin/ %Sat No results found for: IRON, TIBC, FERRITIN, IRONPCTSAT

## 2017-04-03 NOTE — Plan of Care (Signed)
  Progressing Clinical Measurements: Ability to maintain clinical measurements within normal limits will improve 04/02/2017 2359 - Progressing by Theodoro ParmaFlynt, Makynna Manocchio F, RN Diagnostic test results will improve 04/02/2017 2359 - Progressing by Theodoro ParmaFlynt, Melah Ebling F, RN Respiratory complications will improve 04/02/2017 2359 - Progressing by Theodoro ParmaFlynt, Addaleigh Nicholls F, RN Cardiovascular complication will be avoided 04/02/2017 2359 - Progressing by Theodoro ParmaFlynt, Zeev Deakins F, RN Pain Managment: General experience of comfort will improve 04/02/2017 2359 - Progressing by Theodoro ParmaFlynt, Jermya Dowding F, RN Safety: Ability to remain free from injury will improve 04/02/2017 2359 - Progressing by Theodoro ParmaFlynt, Meisha Salone F, RN Skin Integrity: Risk for impaired skin integrity will decrease 04/02/2017 2359 - Progressing by Theodoro ParmaFlynt, Willoughby Doell F, RN

## 2017-04-03 NOTE — Progress Notes (Signed)
PULMONARY / CRITICAL CARE MEDICINE   Name: Aubry Tucholski MRN: [id number] DOB: [birth year]    ADMISSION DATE:  04/01/2017 CONSULTATION DATE:  04/01/2017  REFERRING MD:  Rhunette Croft  CHIEF COMPLAINT:  Chest pain  HISTORY OF PRESENT ILLNESS:  Shjon Lizarraga is a 21 y.o. male with a PMH as outlined below including FSGS with resultant ESRD (T/Th/Sat - last full session Sat 11/10), HTN with multiple admissions for hypertensive urgency.  He was supposed to have HD Tues 11/13 but was unable to get a ride.  Per previous notes, he has had problems with compliance in the past.  He presented to [facility] ED 11/13 with chest pain x 2 days.  He was apparently at work (works at [employer]) and he bent over and felt increased pain.  Coworkers thought he was going to fall out; therefore, they called EMS.  In ED, he was found to have hypertensive urgency.  He was given multiple oral agents with minimal improvement; therefore, was started on nicardipine.  PCCM was subsequently called to admit to the ICU.  Pt reports he has been taking all of his antihypertensives except for Imdur and Amlodipine because he has been out and supposedly had been calling to get refills but has not been able to get the refills prescribed.  He states his chest pain is improved at this time.  Earlier was a tightness like pain, non-radiating, no lightheadedness, no diaphoresis.  PAST MEDICAL HISTORY :  He  has a past medical history of Acute respiratory failure (HCC), ESRD (end stage renal disease) (HCC), FSGS (focal segmental glomerulosclerosis), Hypertension, and Nausea & vomiting (11/2016).  PAST SURGICAL HISTORY: He  has a past surgical history that includes Renal biopsy (2013) and AV fistula placement (12/2015).  No Known Allergies  No current facility-administered medications on file prior to encounter.    Current Outpatient Medications on File Prior to Encounter  Medication Sig  . amLODipine (NORVASC) 10 MG tablet Take 10 mg by  mouth daily.   Marland Kitchen aspirin EC 81 MG EC tablet Take 1 tablet (81 mg total) by mouth daily.  . B Complex-C-Folic Acid (B COMPLEX-VITAMIN C-FOLIC ACID) 1 MG tablet Take 1 tablet by mouth daily.  . cinacalcet (SENSIPAR) 30 MG tablet Take 2 tablets (60 mg total) by mouth daily with supper.  . cloNIDine (CATAPRES) 0.2 MG tablet Take 1 tablet (0.2 mg total) by mouth 3 (three) times daily.  . hydrALAZINE (APRESOLINE) 100 MG tablet Take 100 mg by mouth 3 (three) times daily.   Marland Kitchen HYDROcodone-acetaminophen (NORCO/VICODIN) 5-325 MG tablet Take 1-2 tablets by mouth every 6 (six) hours as needed for severe pain.  . isosorbide mononitrate (IMDUR) 30 MG 24 hr tablet Take 3 tablets (90 mg total) by mouth daily.  Marland Kitchen labetalol (NORMODYNE) 200 MG tablet Take 400 mg by mouth 3 (three) times daily.  . methocarbamol (ROBAXIN) 500 MG tablet Take 1 tablet (500 mg total) by mouth every 8 (eight) hours as needed for muscle spasms.  . Nutritional Supplements (FEEDING SUPPLEMENT, NEPRO CARB STEADY,) LIQD Take 237 mLs by mouth 2 (two) times daily between meals.  . ondansetron (ZOFRAN ODT) 4 MG disintegrating tablet Take 1 tablet (4 mg total) by mouth every 8 (eight) hours as needed for nausea or vomiting.  Marland Kitchen oxymetazoline (AFRIN) 0.05 % nasal spray Place 1 spray into both nostrils 2 (two) times daily as needed (for nose bleed, soak cotton ball and hold with pressure with it in bleeding nare).  . sevelamer (RENAGEL) 800 MG tablet  Take 800-2,400 mg 3 (three) times daily with meals by mouth. Three with meals and 1 with snacks    FAMILY HISTORY:  His indicated that the status of his mother is unknown. He indicated that the status of his father is unknown. He indicated that the status of his maternal grandmother is unknown. He indicated that the status of his paternal grandmother is unknown.   SOCIAL HISTORY: He  reports that  has never smoked. he has never used smokeless tobacco. He reports that he does not drink alcohol or use  drugs.  REVIEW OF SYSTEMS:   Positive for the following: HEENT: Sneezing, nonproductive cough, headache MSK: Back pain  Negative for the following: Constitutional: Loss, weight gain, night sweats, fevers, chills, fatigue, weakness HEENT: Sore throat, dysphasia, change in vision, earache Neuro: Difficult speech, weakness, numbness, ataxia CV: Chest pain, orthopnea, PND, edema, dizziness, palpitations, syncope Respiratory: Hemoptysis, dyspnea, wheezing GI: Nausea or vomiting, abdominal pain Skin: Rash, itching, bruising  SUBJECTIVE:  No overnight events. Chest pain has resolved. Patient complaining of chronic back pain requesting home pain meds and muscle relaxer. Does not want fentanyl because it makes him nauseous.  VITAL SIGNS: BP (!) 146/79   Pulse 93   Temp 98.8 F (37.1 C) (Oral)   Resp 20   Ht 6' (1.829 m)   Wt 194 lb 0.1 oz (88 kg)   SpO2 95%   BMI 26.31 kg/m   HEMODYNAMICS:    VENTILATOR SETTINGS:    INTAKE / OUTPUT: I/O last 3 completed shifts: In: 2972.9 [P.O.:1662; I.V.:1230.9; Other:80] Out: 3306 [Emesis/NG output:301; Other:3000; Stool:5]   PHYSICAL EXAMINATION: General: well nourished, well developed, NAD with non-toxic appearance HEENT: normocephalic, atraumatic, moist mucous membranes, PERRLA, EOMI Neck: supple, non-tender without lymphadenopathy Cardiovascular: regular rate and rhythm without murmurs, rubs, or gallops, mature left arm AV fistula intact Lungs: clear to auscultation bilaterally with normal work of breathing Abdomen: soft, non-tender, non-distended, normoactive bowel sounds Skin: warm, dry, no rashes or lesions, cap refill < 2 seconds Extremities: warm and well perfused, normal tone, no edema Neuro: CNII-XII intact, no dysarthria  LABS:  BMET Recent Labs  Lab 04/01/17 2249 04/02/17 0334 04/03/17 0324  NA 135 140 139  K 6.1* 4.3 4.3  CL 104 104 99*  CO2 17* 19* 26  BUN 78* 81* 45*  CREATININE 23.72* 23.93* 17.00*  GLUCOSE  81 75 88    Electrolytes Recent Labs  Lab 04/01/17 2249 04/02/17 0334 04/03/17 0324  CALCIUM 9.4 10.0 8.8*  MG  --  2.9* 2.1  PHOS  --  5.1* 6.3*    CBC Recent Labs  Lab 04/01/17 2249 04/02/17 0334 04/03/17 0324  WBC 8.1 11.3* 9.1  HGB 12.8* 13.8 12.4*  HCT 39.9 42.5 39.2  PLT 172 177 182    Coag's No results for input(s): APTT, INR in the last 168 hours.  Sepsis Markers No results for input(s): LATICACIDVEN, PROCALCITON, O2SATVEN in the last 168 hours.  ABG No results for input(s): PHART, PCO2ART, PO2ART in the last 168 hours.  Liver Enzymes No results for input(s): AST, ALT, ALKPHOS, BILITOT, ALBUMIN in the last 168 hours.  Cardiac Enzymes Recent Labs  Lab 04/02/17 0334 04/02/17 1052 04/02/17 1920  TROPONINI 0.09* 0.10* 0.16*    Glucose Recent Labs  Lab 04/02/17 0040  GLUCAP 78    Imaging Dg Chest Port 1 View  Result Date: 04/03/2017 CLINICAL DATA:  Hypertensive crisis EXAM: PORTABLE CHEST 1 VIEW COMPARISON:  Two days ago FINDINGS: Cardiomegaly that is  stable. Mild streaky opacity behind the heart. Stable aortic and hilar contours. No edema, effusion, or air bronchogram. IMPRESSION: 1. Cardiomegaly without edema. 2. Mild retrocardiac atelectasis. Electronically Signed   By: Marnee SpringJonathon  Watts M.D.   On: 04/03/2017 07:13     STUDIES:  CXR 11/13 > mild vascular congestion and mild cardiomegaly, lungs grossly clear CXR 11/15 > cardiomegaly without edema, mild retrocardiac atelectasis  CULTURES: RVP 11/14 > negative  ANTIBIOTICS: None  SIGNIFICANT EVENTS: 11/14 > admitted for HTN urgency requiring nifedipine gtt  LINES/TUBES: L-forearm AV fistula (placed 12/2015) PIV R-antecubital 11/13 >> PIV R-hand 11/14 >> PIV R-hand 11/13-11/14  DISCUSSION: Patient is a 21 year old male with ESRD secondary to FSGS presenting with chest pain found to have hypertensive urgency.  ASSESSMENT / PLAN:  PULMONARY No signs of respiratory distress or  hypoxia on admission - Continuous pulse ox  CARDIOVASCULAR Hypertensive urgency: Likely secondary to not taking his antihypertensives for the past several days Chest pain: Thought to be secondary to hypertensive urgency, troponin elevated with peaked t-waves on EKG, last echo 60-65% with LVH and no wall motion abn H/o malignant hypertension - Nifedipine gtt - Home amlodipine 10 mg daily and a 1 spirin 81 mg daily - Clonidine 0.2 mg PO Q8H - Labetalol 400 mg Q8H, isosorbide mononitrate daily - Telemetry  RENAL ESRD secondary to FSGS (HD T/Th/Sat): Last full session 11/10, being evaluated for renal transplant Hyperkalemia: Resolved - Defer to nephrology for next HD session - Daily renal panel and magnesium level - Daily weights / I&Os  GASTROINTESTINAL  Nutrition - Renal diet with fluid restriction 1200 mL - Zofran PRN  HEMATOLOGIC Hemoglobin stable without signs of active bleeding or coagulopathy - Daily CBC - SCDs / heparin SQ for VTE ppx  INFECTIOUS Viral upper respiratory infection though RVP negative - Trend fever curve and WBC - Contact precautions  ENDOCRINE  Stable     NEUROLOGIC  Headaches: Without signs of focal deficit or AMS - Would consider head CT neurological exam changes   FAMILY  - Updates: no family at bedside (aware he is hospitalized, live in [place]) - Inter-disciplinary family meet or Palliative Care meeting due by: 04/09/2017   Durward Parcelavid McMullen, DO [place]

## 2017-04-04 LAB — CBC
HEMATOCRIT: 37 % — AB (ref 39.0–52.0)
Hemoglobin: 11.9 g/dL — ABNORMAL LOW (ref 13.0–17.0)
MCH: 28.4 pg (ref 26.0–34.0)
MCHC: 32.2 g/dL (ref 30.0–36.0)
MCV: 88.3 fL (ref 78.0–100.0)
Platelets: 174 10*3/uL (ref 150–400)
RBC: 4.19 MIL/uL — AB (ref 4.22–5.81)
RDW: 16.7 % — ABNORMAL HIGH (ref 11.5–15.5)
WBC: 7 10*3/uL (ref 4.0–10.5)

## 2017-04-04 LAB — RENAL FUNCTION PANEL
ALBUMIN: 3.4 g/dL — AB (ref 3.5–5.0)
Anion gap: 13 (ref 5–15)
BUN: 57 mg/dL — ABNORMAL HIGH (ref 6–20)
CHLORIDE: 98 mmol/L — AB (ref 101–111)
CO2: 24 mmol/L (ref 22–32)
CREATININE: 20 mg/dL — AB (ref 0.61–1.24)
Calcium: 8.6 mg/dL — ABNORMAL LOW (ref 8.9–10.3)
GFR, EST AFRICAN AMERICAN: 3 mL/min — AB (ref >60)
GFR, EST NON AFRICAN AMERICAN: 3 mL/min — AB (ref >60)
Glucose, Bld: 112 mg/dL — ABNORMAL HIGH (ref 65–99)
PHOSPHORUS: 6.7 mg/dL — AB (ref 2.5–4.6)
POTASSIUM: 4.3 mmol/L (ref 3.5–5.1)
Sodium: 135 mmol/L (ref 135–145)

## 2017-04-04 MED ORDER — LIDOCAINE-PRILOCAINE 2.5-2.5 % EX CREA: 1.0000 "application " | TOPICAL_CREAM | CUTANEOUS | Status: DC | PRN

## 2017-04-04 MED ORDER — FLUTICASONE PROPIONATE 50 MCG/ACT NA SUSP: 1.0000 | Freq: Every day | NASAL | 2 refills | Status: DC

## 2017-04-04 MED ORDER — HEPARIN SODIUM (PORCINE) 1000 UNIT/ML DIALYSIS: 1000.0000 [IU] | INTRAMUSCULAR | Status: DC | PRN

## 2017-04-04 MED ORDER — AMLODIPINE BESYLATE 10 MG PO TABS: 10.0000 mg | ORAL_TABLET | Freq: Every day | ORAL | 1 refills | Status: DC

## 2017-04-04 MED ORDER — SODIUM CHLORIDE 0.9 % IV SOLN: 100.0000 mL | INTRAVENOUS | Status: DC | PRN

## 2017-04-04 MED ORDER — ALTEPLASE 2 MG IJ SOLR: 2.0000 mg | Freq: Once | INTRAMUSCULAR | Status: DC | PRN

## 2017-04-04 MED ORDER — LIDOCAINE HCL (PF) 1 % IJ SOLN: 5.0000 mL | INTRAMUSCULAR | Status: DC | PRN

## 2017-04-04 MED ORDER — HEPARIN SODIUM (PORCINE) 1000 UNIT/ML DIALYSIS: 4000.0000 [IU] | INTRAMUSCULAR | Status: DC | PRN

## 2017-04-04 MED ORDER — PENTAFLUOROPROP-TETRAFLUOROETH EX AERO: 1.0000 "application " | INHALATION_SPRAY | CUTANEOUS | Status: DC | PRN

## 2017-04-04 MED ORDER — ISOSORBIDE MONONITRATE ER 30 MG PO TB24: 90.0000 mg | ORAL_TABLET | Freq: Every day | ORAL | 1 refills | Status: DC

## 2017-04-04 NOTE — Progress Notes (Signed)
[facility] Progress Note  Subjective: BP's look better, occasional high ones.  Headaches and back pain much better.  Wants to go home.   Vitals:   04/04/17 1000 04/04/17 1030 04/04/17 1055 04/04/17 1132  BP: (!) 171/102 (!) 174/97 (!) 162/96 (!) 174/122  Pulse: 74 72 71 81  Resp:   15 12  Temp:   98.2 F (36.8 C)   TempSrc:   Oral   SpO2:   98% 97%  Weight:   84.8 kg (186 lb 15.2 oz)   Height:        Inpatient medications: . amLODipine  10 mg Oral Daily  . aspirin  81 mg Oral Daily  . cinacalcet  60 mg Oral Q supper  . cloNIDine  0.2 mg Oral Q8H  . doxercalciferol  6 mcg Intravenous Q T,Th,Sa-HD  . fluticasone  1 spray Each Nare Daily  . heparin  5,000 Units Subcutaneous Q8H  . hydrALAZINE  100 mg Oral Q8H  . isosorbide mononitrate  90 mg Oral Daily  . labetalol  400 mg Oral Q8H  . sevelamer carbonate  2,400 mg Oral TID WC  . sodium chloride  2 spray Each Nare BID   . sodium chloride    . niCARDipine Stopped (04/03/17 1606)   sodium chloride, HYDROcodone-acetaminophen, methocarbamol, ondansetron (ZOFRAN) IV, sevelamer carbonate  Exam: Alert, no distress No jvd Chest clear bilat RRR no mrg Abd soft ntnd no ascites No LE edema LFA AVF+bruit NF, Ox 3  Dialysis: TTS NW 4h  84.5kg   2/2.25 bath  Hep 4600   LFA AVF -Hectorol 6 mcg IV TIW  -renagel 800 mg 3 tabs PO TID A/C 2 with snacks -Sensipar 60 mg PO q PM       Impression: 1. HTN urgency - off cardene, taking po meds. Symptoms much better today and BP's reasonably well controlled.  Taking po BP meds.  2. ESRD on HD TTS - plan HD today off sched. HD Sat if still here tomorrow.  3. HTN on cardene, po meds 4. Anemia no esa needed 5. MBD cont meds  Plan - HD today off schedule.    Vinson Moselleob Christphor Groft MD [facility] pager [phone]   04/04/2017, 11:33 AM   Recent Labs  Lab 04/02/17 0334 04/03/17 0324 04/04/17 0727  NA 140 139 135  K 4.3 4.3 4.3  CL 104 99* 98*  CO2 19*  26 24  GLUCOSE 75 88 112*  BUN 81* 45* 57*  CREATININE 23.93* 17.00* 20.00*  CALCIUM 10.0 8.8* 8.6*  PHOS 5.1* 6.3* 6.7*   Recent Labs  Lab 04/04/17 0727  ALBUMIN 3.4*   Recent Labs  Lab 04/02/17 0334 04/03/17 0324 04/04/17 0727  WBC 11.3* 9.1 7.0  HGB 13.8 12.4* 11.9*  HCT 42.5 39.2 37.0*  MCV 89.3 89.1 88.3  PLT 177 182 174   Iron/TIBC/Ferritin/ %Sat No results found for: IRON, TIBC, FERRITIN, IRONPCTSAT

## 2017-04-04 NOTE — Progress Notes (Signed)
Patient in HD will assess when returns to unit

## 2017-04-04 NOTE — Progress Notes (Signed)
Pt returned from HD.

## 2017-04-04 NOTE — Progress Notes (Signed)
CSW provided pt's RN with bus pass. There are no further CSW needs at this time. CSW signing off.     Claude MangesKierra S. Stephanos Fan, MSW, LCSW-A Emergency Department Clinical Social Worker [phone]

## 2017-04-04 NOTE — Progress Notes (Signed)
Patient up in room, removed self from cardiac monitor. Stable, social worker called for assistance with transportation home. Bus pass x2 given to patient

## 2017-04-04 NOTE — Discharge Summary (Signed)
Physician Discharge Summary  Patient ID: Jeremy Huerta MRN: [id number] DOB/AGE: April 01, 1996 21 y.o.  Admit date: 04/01/2017 Discharge date: 04/04/2017    Discharge Diagnoses:  Hypertensive Emergency  End Stage Renal Disease secondary to FSGS Hyperkalemia  Viral URI Headache                                                                      DISCHARGE PLAN BY DIAGNOSIS      Hypertensive Emergency  End Stage Renal Disease secondary to FSGS Hyperkalemia - resolved  Discharge Plan: Continue prior home medications > clonidine, labetalol Rx given for amlodipine, imdur at discharge with one refill to allow for time for him to see his primary Nephrologist  Importance of compliance stressed to patient  Pt to return to normal scheduled HD on Saturday 11/17 Given difficulty with transportation, he may need to transfer his care to [place] where he lives.   Follow up with Dr. Quentin Cornwall at [facility] as planned  Transportation arranged for home at discharge as well  Viral URI  Discharge Plan: Supportive care  Avoid afrin if possible  Flonase   Headache - resolved  Discharge Plan: No acute follow up                     DISCHARGE SUMMARY    Jeremy Huerta is a 21 y.o. male with a PMH of FSGS with resultant ESRD (T/Th/Sat - last full session Sat 11/10), HTN with multiple admissions for hypertensive urgency.  He was supposed to have HD Tues 11/13 but was unable to get a ride.  Per previous notes, he has had problems with compliance in the past.  He presented to [facility] ED 11/13 with chest pain x 2 days.  He was apparently at work (works at [employer]) and he bent over and felt increased pain.  Coworkers thought he was going to fall out; therefore, they called EMS.  In ED, he was found to have hypertensive emergency.  He was given multiple oral agents with minimal improvement; therefore, was started on nicardipine.  PCCM was subsequently called to admit to the ICU.  Pt reports he  has been taking all of his antihypertensives except for Imdur and Amlodipine because he has been out and supposedly had been calling to get refills but has not been able to get the refills prescribed. Troponin assessed and negative.  CXR was negative for acute infiltrate / edema but did show cardiomegaly.  Nephrology was consulted for HD needs.  The patient had a prolonged ICU course due to difficult to control blood pressure despite continuous IV agents and hyperkalemia.  Home medications were restarted.  He had blood pressure associated headaches / nausea.  Symptoms resolved with BP control.  Hyperkalemia was managed with HD.  The patient made slow improvement and was medically cleared 11/16 for discharge with plans as above.            SIGNIFICANT DIAGNOSTIC STUDIES 11/15  CXR >> cardiomegaly without edema, mild retrocardiac atelectasis   MICRO DATA  Respiratory Viral Panel 11/16 >> negative   CONSULTS Nephrology    Discharge Exam: General: young adult male in NAD, sitting on side of bed PSY:  Anxious to go home  Neuro: AAOx4, speech clear,  MAE CV: s1s2 rrr, no m/r/g  PULM: even/non-labored, lungs bilaterally clear GI: soft/non-tender, BSx4 active  Extremities: warm/dry, no edema   Vitals:   04/04/17 1030 04/04/17 1055 04/04/17 1132 04/04/17 1146  BP: (!) 174/97 (!) 162/96 (!) 174/122   Pulse: 72 71 81   Resp:  15 12   Temp:  98.2 F (36.8 C)  98.2 F (36.8 C)  TempSrc:  Oral  Oral  SpO2:  98% 97%   Weight:  186 lb 15.2 oz (84.8 kg)    Height:         Discharge Labs  BMET Recent Labs  Lab 04/01/17 2249 04/02/17 0334 04/03/17 0324 04/04/17 0727  NA 135 140 139 135  K 6.1* 4.3 4.3 4.3  CL 104 104 99* 98*  CO2 17* 19* 26 24  GLUCOSE 81 75 88 112*  BUN 78* 81* 45* 57*  CREATININE 23.72* 23.93* 17.00* 20.00*  CALCIUM 9.4 10.0 8.8* 8.6*  MG  --  2.9* 2.1  --   PHOS  --  5.1* 6.3* 6.7*    CBC Recent Labs  Lab 04/02/17 0334 04/03/17 0324 04/04/17 0727  HGB  13.8 12.4* 11.9*  HCT 42.5 39.2 37.0*  WBC 11.3* 9.1 7.0  PLT 177 182 174    Anti-Coagulation No results for input(s): INR in the last 168 hours.  Discharge Instructions    Call MD for:  difficulty breathing, headache or visual disturbances   Complete by:  As directed    Call MD for:  extreme fatigue   Complete by:  As directed    Call MD for:  hives   Complete by:  As directed    Call MD for:  persistant dizziness or light-headedness   Complete by:  As directed    Call MD for:  persistant nausea and vomiting   Complete by:  As directed    Call MD for:  redness, tenderness, or signs of infection (pain, swelling, redness, odor or green/yellow discharge around incision site)   Complete by:  As directed    Call MD for:  severe uncontrolled pain   Complete by:  As directed    Call MD for:  temperature >100.4   Complete by:  As directed    Diet - low sodium heart healthy   Complete by:  As directed    Discharge instructions   Complete by:  As directed    1.  Review your medications carefully.  2.  You were given a prescription for your isosorbide and amlodipine.  You have one refill and will need to follow up with Dr. Quentin Cornwall for further refills.  3.  Please keep your appointment with Dr. Quentin Cornwall 4.  Take your medications as prescribed - very important that you continue to control your blood pressure and go to dialysis! 5.  Report to your normal dialysis on Saturday as normal. 5.  Please report to the ER if you have new or worsening symptoms.   Increase activity slowly   Complete by:  As directed        Follow-up Information    Meda Klinefelter, MD Follow up on 05/01/2017.   Specialty:  Nephrology Why:  Appt at [place].  Please arrive at 3:45 PM.  You need to bring your insurance card, copy of your medications and copay (if any).    [place], [place] Contact information: [address] [place]  [phone]  Allergies as of 04/04/2017   No Known Allergies     Medication List    STOP taking these medications   feeding supplement (NEPRO CARB STEADY) Liqd   ondansetron 4 MG disintegrating tablet Commonly known as:  ZOFRAN ODT   oxymetazoline 0.05 % nasal spray Commonly known as:  AFRIN     TAKE these medications   amLODipine 10 MG tablet Commonly known as:  NORVASC Take 1 tablet (10 mg total) daily by mouth.   aspirin 81 MG EC tablet Take 1 tablet (81 mg total) by mouth daily.   B complex-vitamin C-folic acid 1 MG tablet Take 1 tablet by mouth daily.   cinacalcet 30 MG tablet Commonly known as:  SENSIPAR Take 2 tablets (60 mg total) by mouth daily with supper.   cloNIDine 0.2 MG tablet Commonly known as:  CATAPRES Take 1 tablet (0.2 mg total) by mouth 3 (three) times daily.   fluticasone 50 MCG/ACT nasal spray Commonly known as:  FLONASE Place 1 spray daily into both nostrils. Start taking on:  04/05/2017   hydrALAZINE 100 MG tablet Commonly known as:  APRESOLINE Take 100 mg by mouth 3 (three) times daily.   HYDROcodone-acetaminophen 5-325 MG tablet Commonly known as:  NORCO/VICODIN Take 1-2 tablets by mouth every 6 (six) hours as needed for severe pain.   isosorbide mononitrate 30 MG 24 hr tablet Commonly known as:  IMDUR Take 3 tablets (90 mg total) daily by mouth.   labetalol 200 MG tablet Commonly known as:  NORMODYNE Take 400 mg by mouth 3 (three) times daily.   methocarbamol 500 MG tablet Commonly known as:  ROBAXIN Take 1 tablet (500 mg total) by mouth every 8 (eight) hours as needed for muscle spasms.   sevelamer 800 MG tablet Commonly known as:  RENAGEL Take 800-2,400 mg 3 (three) times daily with meals by mouth. Three with meals and 1 with snacks         Disposition:  Home.  No new home health needs identified.   Discharged Condition: Jeremy Huerta has met maximum benefit of inpatient care and is  medically stable and cleared for discharge.  Patient is pending follow up as above.      Time spent on disposition:  35 Minutes.   Signed: Noe Gens, NP-C [facility] Pgr: [phone] Office: [phone]

## 2017-04-22 ENCOUNTER — Emergency Department (HOSPITAL_COMMUNITY)
Admission: EM | Admit: 2017-04-22 | Discharge: 2017-04-22 | Disposition: A | Discharge status: Home / Self Care | Payer: Medicaid Other | Attending: Emergency Medicine | Admitting: Emergency Medicine

## 2017-04-22 ENCOUNTER — Encounter (HOSPITAL_COMMUNITY): Payer: Self-pay | Admitting: Emergency Medicine

## 2017-04-22 ENCOUNTER — Emergency Department (HOSPITAL_COMMUNITY): Payer: Medicaid Other

## 2017-04-22 ENCOUNTER — Other Ambulatory Visit: Payer: Self-pay

## 2017-04-22 DIAGNOSIS — I120 Hypertensive chronic kidney disease with stage 5 chronic kidney disease or end stage renal disease: Secondary | ICD-10-CM | POA: Diagnosis not present

## 2017-04-22 DIAGNOSIS — N186 End stage renal disease: Secondary | ICD-10-CM | POA: Diagnosis not present

## 2017-04-22 DIAGNOSIS — R51 Headache: Secondary | ICD-10-CM | POA: Insufficient documentation

## 2017-04-22 DIAGNOSIS — Z992 Dependence on renal dialysis: Secondary | ICD-10-CM | POA: Insufficient documentation

## 2017-04-22 DIAGNOSIS — R519 Headache, unspecified: Secondary | ICD-10-CM

## 2017-04-22 DIAGNOSIS — Z79899 Other long term (current) drug therapy: Secondary | ICD-10-CM | POA: Diagnosis not present

## 2017-04-22 DIAGNOSIS — Z7982 Long term (current) use of aspirin: Secondary | ICD-10-CM | POA: Insufficient documentation

## 2017-04-22 LAB — CBC
HEMATOCRIT: 41.9 % (ref 39.0–52.0)
HEMOGLOBIN: 13.6 g/dL (ref 13.0–17.0)
MCH: 28.3 pg (ref 26.0–34.0)
MCHC: 32.5 g/dL (ref 30.0–36.0)
MCV: 87.3 fL (ref 78.0–100.0)
Platelets: 218 10*3/uL (ref 150–400)
RBC: 4.8 MIL/uL (ref 4.22–5.81)
RDW: 16.1 % — ABNORMAL HIGH (ref 11.5–15.5)
WBC: 7.7 10*3/uL (ref 4.0–10.5)

## 2017-04-22 LAB — BASIC METABOLIC PANEL
ANION GAP: 14 (ref 5–15)
BUN: 32 mg/dL — ABNORMAL HIGH (ref 6–20)
CO2: 30 mmol/L (ref 22–32)
Calcium: 9.5 mg/dL (ref 8.9–10.3)
Chloride: 97 mmol/L — ABNORMAL LOW (ref 101–111)
Creatinine, Ser: 12.66 mg/dL — ABNORMAL HIGH (ref 0.61–1.24)
GFR calc Af Amer: 6 mL/min — ABNORMAL LOW (ref >60)
GFR calc non Af Amer: 5 mL/min — ABNORMAL LOW (ref >60)
GLUCOSE: 125 mg/dL — AB (ref 65–99)
POTASSIUM: 3.6 mmol/L (ref 3.5–5.1)
Sodium: 141 mmol/L (ref 135–145)

## 2017-04-22 MED ORDER — ACETAMINOPHEN 325 MG PO TABS
650.0000 mg | ORAL_TABLET | Freq: Once | ORAL | Status: AC
  Administered 2017-04-22: 650 mg via ORAL
  Filled 2017-04-22: qty 2

## 2017-04-22 MED ORDER — OXYCODONE-ACETAMINOPHEN 5-325 MG PO TABS
1.0000 | ORAL_TABLET | Freq: Once | ORAL | Status: AC
  Administered 2017-04-22: 1 via ORAL
  Filled 2017-04-22: qty 1

## 2017-04-22 MED ORDER — ACETAMINOPHEN 500 MG PO TABS: 1000.0000 mg | ORAL_TABLET | Freq: Once | ORAL | Status: DC

## 2017-04-22 NOTE — ED Notes (Signed)
ED Provider at bedside. 

## 2017-04-22 NOTE — ED Provider Notes (Signed)
[facility] EMERGENCY DEPARTMENT Provider Note   CSN: [id number] Arrival date & time: 04/22/17  1053     History   Chief Complaint Chief Complaint  Patient presents with  . Headache  . Nausea    HPI Jeremy Huerta is a 21 y.o. male.  HPI Patient is a 21 year old male with end-stage renal disease who presents to the emergency department complaining of headache and muscle cramps.  Denies fevers and chills.  Is a dialysis patient was at dialysis today.  He completed almost a complete run of dialysis.  He states there is only 50 minutes left.  No recent medication changes.  History of recurrent headaches.  EMS reported hypertension on their arrival and he was given clonidine in route with improvement.  Continues complaining of moderate headache at this time.   Past Medical History:  Diagnosis Date  . Acute respiratory failure (HCC)   . ESRD (end stage renal disease) (HCC)    TTS  . FSGS (focal segmental glomerulosclerosis)   . Hypertension   . Nausea & vomiting 11/2016    Patient Active Problem List   Diagnosis Date Noted  . Skin abscess 03/07/2017  . 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 Back pain 03/05/2017  . Hypertensive emergency 01/13/2017  . History of anemia due to chronic kidney disease 01/13/2017  . Secondary hyperparathyroidism (HCC) 01/13/2017  . Bleeding from the nose 01/13/2017  . Fever 01/13/2017  . Chest pain on breathing   . SOB (shortness of breath)   . Gastroesophageal reflux disease   . Respiratory failure (HCC) 12/24/2016  . Acute pulmonary edema (HCC)   . End-stage renal disease on hemodialysis (HCC)   . Hypertensive urgency 12/02/2016  . Leukocytosis 12/02/2016  . Nausea & vomiting 12/02/2016  . Hyperkalemia 12/02/2016  . Acute respiratory failure (HCC) 04/22/2016  . Diarrhea 04/22/2016  . HCAP (healthcare-associated pneumonia) 04/22/2016  . Tachycardia 04/22/2016  . Hypertension     Past Surgical History:    Procedure Laterality Date  . AV FISTULA PLACEMENT  12/2015  . RENAL BIOPSY  2013       Home Medications    Prior to Admission medications   Medication Sig Start Date End Date Taking? Authorizing Provider  amLODipine (NORVASC) 10 MG tablet Take 1 tablet (10 mg total) daily by mouth. 04/04/17   Jeanella Craze, NP  aspirin EC 81 MG EC tablet Take 1 tablet (81 mg total) by mouth daily. 01/16/17   Kathlen Mody, MD  B Complex-C-Folic Acid (B COMPLEX-VITAMIN C-FOLIC ACID) 1 MG tablet Take 1 tablet by mouth daily. 04/30/16   [provider]  cinacalcet (SENSIPAR) 30 MG tablet Take 2 tablets (60 mg total) by mouth daily with supper. 12/04/16   Rolly Salter, MD  cloNIDine (CATAPRES) 0.2 MG tablet Take 1 tablet (0.2 mg total) by mouth 3 (three) times daily. 12/27/16   Drema Dallas, MD  fluticasone (FLONASE) 50 MCG/ACT nasal spray Place 1 spray daily into both nostrils. 04/05/17   Jeanella Craze, NP  hydrALAZINE (APRESOLINE) 100 MG tablet Take 100 mg by mouth 3 (three) times daily.  04/13/16   [provider]  HYDROcodone-acetaminophen (NORCO/VICODIN) 5-325 MG tablet Take 1-2 tablets by mouth every 6 (six) hours as needed for severe pain. 03/06/17   Narda Bonds, MD  isosorbide mononitrate (IMDUR) 30 MG 24 hr tablet Take 3 tablets (90 mg total) daily by mouth. 04/04/17   Jeanella Craze, NP  labetalol (NORMODYNE) 200  MG tablet Take 400 mg by mouth 3 (three) times daily. 03/16/16   [provider]  methocarbamol (ROBAXIN) 500 MG tablet Take 1 tablet (500 mg total) by mouth every 8 (eight) hours as needed for muscle spasms. 03/05/17   Narda BondsNettey, Ralph A, MD  sevelamer (RENAGEL) 800 MG tablet Take 800-2,400 mg 3 (three) times daily with meals by mouth. Three with meals and 1 with snacks    [provider]    Family History Family History  Problem Relation Age of Onset  . Hypertension Mother   . Diabetes Father   . Hypertension Father   . Kidney disease  Father   . Hypertension Maternal Grandmother   . Diabetes Paternal Grandmother   . Hypertension Paternal Grandmother     Social History Social History   Tobacco Use  . Smoking status: Never Smoker  . Smokeless tobacco: Never Used  Substance Use Topics  . Alcohol use: No  . Drug use: No     Allergies   Patient has no known allergies.   Review of Systems Review of Systems  All other systems reviewed and are negative.    Physical Exam Updated Vital Signs BP (!) 157/92   Pulse 90   Temp 98.1 F (36.7 C) (Oral)   Resp 19   Ht 6' (1.829 m)   SpO2 97%   BMI 25.35 kg/m   Physical Exam  Constitutional: He is oriented to person, place, and time. He appears well-developed and well-nourished.  HENT:  Head: Normocephalic and atraumatic.  Eyes: EOM are normal. Pupils are equal, round, and reactive to light.  Neck: Normal range of motion.  Cardiovascular: Normal rate, regular rhythm, normal heart sounds and intact distal pulses.  Pulmonary/Chest: Effort normal and breath sounds normal. No respiratory distress.  Abdominal: Soft. He exhibits no distension. There is no tenderness.  Musculoskeletal: Normal range of motion.  Neurological: He is alert and oriented to person, place, and time.  5/5 strength in major muscle groups of  bilateral upper and lower extremities. Speech normal. No facial asymetry.   Skin: Skin is warm and dry.  Psychiatric: He has a normal mood and affect. Judgment normal.  Nursing note and vitals reviewed.    ED Treatments / Results  Labs (all labs ordered are listed, but only abnormal results are displayed) Labs Reviewed  CBC - Abnormal; Notable for the following components:      Result Value   RDW 16.1 (*)    All other components within normal limits  BASIC METABOLIC PANEL - Abnormal; Notable for the following components:   Chloride 97 (*)    Glucose, Bld 125 (*)    BUN 32 (*)    Creatinine, Ser 12.66 (*)    GFR calc non Af Amer 5 (*)     GFR calc Af Amer 6 (*)    All other components within normal limits    EKG  EKG Interpretation  Date/Time:  Tuesday April 22 2017 11:04:03 EST Ventricular Rate:  106 PR Interval:    QRS Duration: 96 QT Interval:  360 QTC Calculation: 478 R Axis:   82 Text Interpretation:  Sinus tachycardia Nonspecific T abnormalities, lateral leads ST elev, probable normal early repol pattern Borderline prolonged QT interval No significant change was found Confirmed by Azalia Bilisampos, Teren Franckowiak ([id number]) on 04/22/2017 1:21:34 PM       Radiology Ct Head Wo Contrast  Result Date: 04/22/2017 CLINICAL DATA:  Severe diffuse headache, blurred vision, lightheadedness, hyper 10 EXAM: CT HEAD  WITHOUT CONTRAST TECHNIQUE: Contiguous axial images were obtained from the base of the skull through the vertex without intravenous contrast. COMPARISON:  None. FINDINGS: Brain: The ventricular system is normal in size and configuration, and the septum is in a normal midline position. The fourth ventricle and basilar cisterns are unremarkable. No hemorrhage, mass lesion, or acute infarction is seen. Vascular: No vascular abnormality is noted on this unenhanced study. Skull: On bone window images, no calvarial abnormality is seen. Sinuses/Orbits: There is some opacification of ethmoid air cells which may indicate ethmoid sinus disease. No air-fluid level is seen within the paranasal sinuses. Other: None. IMPRESSION: 1. No acute intracranial abnormality. 2. Possible ethmoid sinus disease.  No other abnormality. Electronically Signed   By: Dwyane DeePaul  Barry M.D.   On: 04/22/2017 12:18    Procedures Procedures (including critical care time)  Medications Ordered in ED Medications  oxyCODONE-acetaminophen (PERCOCET/ROXICET) 5-325 MG per tablet 1 tablet (1 tablet Oral Given 04/22/17 1227)  acetaminophen (TYLENOL) tablet 650 mg (650 mg Oral Given 04/22/17 1228)     Initial Impression / Assessment and Plan / ED Course  I have reviewed the triage  vital signs and the nursing notes.  Pertinent labs & imaging results that were available during my care of the patient were reviewed by me and considered in my medical decision making (see chart for details).    Improvement in symptoms.  Still with mild headache at this time.  Head CT demonstrates no significant abnormality.  No focal neuro deficit.  Primary care follow-up.  No indication for additional testing or admission to the hospital.  Nonspecific headache.   Final Clinical Impressions(s) / ED Diagnoses   Final diagnoses:  Acute nonintractable headache, unspecified headache type    ED Discharge Orders    None       Azalia Bilisampos, Siya Flurry, MD 04/22/17 [phone]

## 2017-04-22 NOTE — ED Triage Notes (Signed)
Pt arrives from dialysis via [facility] reporting ongoing HA x 2 days, n/v since last night.  EMS reports pt HTN at dialysis, given 0.2mg  clonidine x 2, BP improved to 162/106 with EMS.  EMS reports pt c/o L sided CP enroute, described as "sharp". Pt denies CP at this time. EMS reports giving 4 mg ODT Zofran.  AOx4, rating HA 9/10, generalized, no focal deficit noted. Pt grimacing, c/o leg cramps at this time.

## 2017-04-29 ENCOUNTER — Encounter (HOSPITAL_COMMUNITY): Payer: Self-pay | Admitting: Emergency Medicine

## 2017-04-29 ENCOUNTER — Other Ambulatory Visit: Payer: Self-pay

## 2017-04-29 ENCOUNTER — Observation Stay (HOSPITAL_COMMUNITY)
Admission: EM | Admit: 2017-04-29 | Discharge: 2017-04-30 | Disposition: A | Discharge status: Home / Self Care | Payer: Medicaid Other | Attending: Internal Medicine | Admitting: Internal Medicine

## 2017-04-29 DIAGNOSIS — R1033 Periumbilical pain: Secondary | ICD-10-CM | POA: Insufficient documentation

## 2017-04-29 DIAGNOSIS — I517 Cardiomegaly: Secondary | ICD-10-CM | POA: Insufficient documentation

## 2017-04-29 DIAGNOSIS — K219 Gastro-esophageal reflux disease without esophagitis: Secondary | ICD-10-CM | POA: Diagnosis not present

## 2017-04-29 DIAGNOSIS — I160 Hypertensive urgency: Secondary | ICD-10-CM | POA: Insufficient documentation

## 2017-04-29 DIAGNOSIS — I120 Hypertensive chronic kidney disease with stage 5 chronic kidney disease or end stage renal disease: Secondary | ICD-10-CM | POA: Insufficient documentation

## 2017-04-29 DIAGNOSIS — Z8719 Personal history of other diseases of the digestive system: Secondary | ICD-10-CM | POA: Diagnosis not present

## 2017-04-29 DIAGNOSIS — R011 Cardiac murmur, unspecified: Secondary | ICD-10-CM | POA: Diagnosis not present

## 2017-04-29 DIAGNOSIS — R748 Abnormal levels of other serum enzymes: Secondary | ICD-10-CM | POA: Diagnosis not present

## 2017-04-29 DIAGNOSIS — N2581 Secondary hyperparathyroidism of renal origin: Secondary | ICD-10-CM | POA: Diagnosis not present

## 2017-04-29 DIAGNOSIS — R197 Diarrhea, unspecified: Secondary | ICD-10-CM

## 2017-04-29 DIAGNOSIS — Z841 Family history of disorders of kidney and ureter: Secondary | ICD-10-CM

## 2017-04-29 DIAGNOSIS — Z9114 Patient's other noncompliance with medication regimen: Secondary | ICD-10-CM | POA: Insufficient documentation

## 2017-04-29 DIAGNOSIS — Z992 Dependence on renal dialysis: Secondary | ICD-10-CM | POA: Insufficient documentation

## 2017-04-29 DIAGNOSIS — G8929 Other chronic pain: Secondary | ICD-10-CM | POA: Insufficient documentation

## 2017-04-29 DIAGNOSIS — Z7982 Long term (current) use of aspirin: Secondary | ICD-10-CM | POA: Diagnosis not present

## 2017-04-29 DIAGNOSIS — N186 End stage renal disease: Secondary | ICD-10-CM | POA: Insufficient documentation

## 2017-04-29 DIAGNOSIS — R112 Nausea with vomiting, unspecified: Secondary | ICD-10-CM | POA: Diagnosis not present

## 2017-04-29 DIAGNOSIS — R1110 Vomiting, unspecified: Secondary | ICD-10-CM | POA: Diagnosis present

## 2017-04-29 DIAGNOSIS — Z79899 Other long term (current) drug therapy: Secondary | ICD-10-CM | POA: Diagnosis not present

## 2017-04-29 DIAGNOSIS — Z833 Family history of diabetes mellitus: Secondary | ICD-10-CM

## 2017-04-29 DIAGNOSIS — R109 Unspecified abdominal pain: Secondary | ICD-10-CM

## 2017-04-29 DIAGNOSIS — Z8249 Family history of ischemic heart disease and other diseases of the circulatory system: Secondary | ICD-10-CM

## 2017-04-29 DIAGNOSIS — Z8619 Personal history of other infectious and parasitic diseases: Secondary | ICD-10-CM

## 2017-04-29 LAB — CBC WITH DIFFERENTIAL/PLATELET
Basophils Absolute: 0.1 10*3/uL (ref 0.0–0.1)
Basophils Relative: 1 %
Eosinophils Absolute: 0.7 10*3/uL (ref 0.0–0.7)
Eosinophils Relative: 8 %
HCT: 39.3 % (ref 39.0–52.0)
Hemoglobin: 12.8 g/dL — ABNORMAL LOW (ref 13.0–17.0)
LYMPHS ABS: 2.1 10*3/uL (ref 0.7–4.0)
LYMPHS PCT: 23 %
MCH: 28.4 pg (ref 26.0–34.0)
MCHC: 32.6 g/dL (ref 30.0–36.0)
MCV: 87.1 fL (ref 78.0–100.0)
MONO ABS: 0.5 10*3/uL (ref 0.1–1.0)
MONOS PCT: 5 %
Neutro Abs: 5.6 10*3/uL (ref 1.7–7.7)
Neutrophils Relative %: 63 %
PLATELETS: 202 10*3/uL (ref 150–400)
RBC: 4.51 MIL/uL (ref 4.22–5.81)
RDW: 16.1 % — AB (ref 11.5–15.5)
WBC: 8.9 10*3/uL (ref 4.0–10.5)

## 2017-04-29 LAB — COMPREHENSIVE METABOLIC PANEL
ALK PHOS: 59 U/L (ref 38–126)
ALT: 15 U/L — AB (ref 17–63)
AST: 19 U/L (ref 15–41)
Albumin: 4.2 g/dL (ref 3.5–5.0)
Anion gap: 17 — ABNORMAL HIGH (ref 5–15)
BUN: 50 mg/dL — AB (ref 6–20)
CHLORIDE: 101 mmol/L (ref 101–111)
CO2: 21 mmol/L — AB (ref 22–32)
CREATININE: 18.11 mg/dL — AB (ref 0.61–1.24)
Calcium: 10.1 mg/dL (ref 8.9–10.3)
GFR calc Af Amer: 4 mL/min — ABNORMAL LOW (ref >60)
GFR calc non Af Amer: 3 mL/min — ABNORMAL LOW (ref >60)
Glucose, Bld: 85 mg/dL (ref 65–99)
Potassium: 5.2 mmol/L — ABNORMAL HIGH (ref 3.5–5.1)
SODIUM: 139 mmol/L (ref 135–145)
Total Bilirubin: 0.5 mg/dL (ref 0.3–1.2)
Total Protein: 7.9 g/dL (ref 6.5–8.1)

## 2017-04-29 LAB — LIPASE, BLOOD: Lipase: 159 U/L — ABNORMAL HIGH (ref 11–51)

## 2017-04-29 MED ORDER — CLONIDINE HCL 0.2 MG PO TABS
0.2000 mg | ORAL_TABLET | Freq: Three times a day (TID) | ORAL | Status: DC
  Administered 2017-04-29 – 2017-04-30 (×3): 0.2 mg via ORAL
  Filled 2017-04-29 (×4): qty 1

## 2017-04-29 MED ORDER — ISOSORBIDE MONONITRATE ER 60 MG PO TB24
90.0000 mg | ORAL_TABLET | Freq: Every day | ORAL | Status: DC
  Administered 2017-04-30: 90 mg via ORAL
  Filled 2017-04-29: qty 1

## 2017-04-29 MED ORDER — AMLODIPINE BESYLATE 10 MG PO TABS
10.0000 mg | ORAL_TABLET | Freq: Every day | ORAL | Status: DC
  Administered 2017-04-30: 10 mg via ORAL
  Filled 2017-04-29: qty 1

## 2017-04-29 MED ORDER — DOXERCALCIFEROL 4 MCG/2ML IV SOLN: 6.0000 ug | INTRAVENOUS | Status: DC

## 2017-04-29 MED ORDER — HYDROCODONE-ACETAMINOPHEN 5-325 MG PO TABS
1.0000 | ORAL_TABLET | Freq: Four times a day (QID) | ORAL | Status: DC | PRN
  Administered 2017-04-29: 1 via ORAL
  Filled 2017-04-29: qty 1

## 2017-04-29 MED ORDER — METHOCARBAMOL 500 MG PO TABS
500.0000 mg | ORAL_TABLET | Freq: Three times a day (TID) | ORAL | Status: DC | PRN
  Administered 2017-04-29: 500 mg via ORAL
  Filled 2017-04-29: qty 1

## 2017-04-29 MED ORDER — ACETAMINOPHEN 325 MG PO TABS: 650.0000 mg | ORAL_TABLET | Freq: Four times a day (QID) | ORAL | Status: DC | PRN

## 2017-04-29 MED ORDER — ACETAMINOPHEN 650 MG RE SUPP: 650.0000 mg | Freq: Four times a day (QID) | RECTAL | Status: DC | PRN

## 2017-04-29 MED ORDER — CHLORHEXIDINE GLUCONATE 0.12 % MT SOLN
15.0000 mL | Freq: Two times a day (BID) | OROMUCOSAL | Status: DC
  Administered 2017-04-30: 15 mL via OROMUCOSAL
  Filled 2017-04-29: qty 15

## 2017-04-29 MED ORDER — SENNOSIDES-DOCUSATE SODIUM 8.6-50 MG PO TABS: 1.0000 | ORAL_TABLET | Freq: Every evening | ORAL | Status: DC | PRN

## 2017-04-29 MED ORDER — PROMETHAZINE HCL 25 MG/ML IJ SOLN
12.5000 mg | Freq: Once | INTRAMUSCULAR | Status: AC
  Administered 2017-04-29: 12.5 mg via INTRAVENOUS
  Filled 2017-04-29: qty 1

## 2017-04-29 MED ORDER — PROMETHAZINE HCL 25 MG/ML IJ SOLN
12.5000 mg | Freq: Four times a day (QID) | INTRAMUSCULAR | Status: DC | PRN
  Administered 2017-04-29: 12.5 mg via INTRAVENOUS
  Filled 2017-04-29: qty 1

## 2017-04-29 MED ORDER — FENTANYL CITRATE (PF) 100 MCG/2ML IJ SOLN
50.0000 ug | Freq: Once | INTRAMUSCULAR | Status: AC
Start: 2017-04-29 — End: 2017-04-29
  Administered 2017-04-29: 50 ug via INTRAVENOUS
  Filled 2017-04-29: qty 2

## 2017-04-29 MED ORDER — HYDRALAZINE HCL 20 MG/ML IJ SOLN
10.0000 mg | INTRAMUSCULAR | Status: DC | PRN
  Administered 2017-04-29: 10 mg via INTRAVENOUS
  Filled 2017-04-29: qty 1

## 2017-04-29 MED ORDER — ORAL CARE MOUTH RINSE: 15.0000 mL | Freq: Two times a day (BID) | OROMUCOSAL | Status: DC

## 2017-04-29 MED ORDER — HEPARIN SODIUM (PORCINE) 5000 UNIT/ML IJ SOLN
5000.0000 [IU] | Freq: Three times a day (TID) | INTRAMUSCULAR | Status: DC
  Filled 2017-04-29: qty 1

## 2017-04-29 MED ORDER — IOPAMIDOL (ISOVUE-300) INJECTION 61%
INTRAVENOUS | Status: AC
  Filled 2017-04-29: qty 100

## 2017-04-29 MED ORDER — HYDRALAZINE HCL 50 MG PO TABS
100.0000 mg | ORAL_TABLET | Freq: Three times a day (TID) | ORAL | Status: DC
  Administered 2017-04-30 (×2): 100 mg via ORAL
  Filled 2017-04-29 (×2): qty 2

## 2017-04-29 MED ORDER — LABETALOL HCL 200 MG PO TABS
400.0000 mg | ORAL_TABLET | Freq: Three times a day (TID) | ORAL | Status: DC
  Administered 2017-04-30 (×2): 400 mg via ORAL
  Filled 2017-04-29 (×2): qty 2

## 2017-04-29 NOTE — Progress Notes (Signed)
Patients BP 192/129. RN administered PRN Hydralazine 10mg  IV and scheduled Clonidine.  RN rechecked BP 188/129. RN notified on call IMTS MD, Anthonette LegatoHarden. Patient is scheduled for HD tonight.  RN awaiting call back.

## 2017-04-29 NOTE — ED Triage Notes (Signed)
Pt arrives via EMS with complaints of headche starting today, N/V that started last night. Pt endorses blurred vision that began with the headache. Dialysis pt TThS, missed today, last full tx Sat. Pt HTN and has taken BP meds today.

## 2017-04-29 NOTE — ED Provider Notes (Addendum)
[facility] Provider Note   CSN: [id number] Arrival date & time: 04/29/17  1138     History   Chief Complaint Chief Complaint  Patient presents with  . Nausea  . Headache    HPI Jeremy Huerta is a 21 y.o. male.  HPI Patient presents with nausea vomiting and abdominal pain for the last couple days.  No diarrhea.  Has dull upper and lower abdominal pain.  Has had episodes of this in the past.  He is a dialysis patient was last dialyzed on Saturday.  Missed today being Tuesday.  States the transport was not running until too late for him.  No fevers.  States he feels bad.  Has had some inflammation in his stomach in the past.  [facility] [place] of this.  [place].  Dr. Jeanice Limurham is his nephrologist here and gets dialyzed [facility] [address] [facility].  No blood in the stool.  No blood in the emesis.  Pain is dull.  Said he does not drink alcohol.  States his blood pressures been high to.  States the nausea and vomiting came first on the headache and high blood pressure started. Past Medical History:  Diagnosis Date  . Acute respiratory failure (HCC)   . ESRD (end stage renal disease) (HCC)    TTS  . FSGS (focal segmental glomerulosclerosis)   . Hypertension   . Nausea & vomiting 11/2016    Patient Active Problem List   Diagnosis Date Noted  . Skin abscess 03/07/2017  . 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 Back pain 03/05/2017  . Hypertensive emergency 01/13/2017  . History of anemia due to chronic kidney disease 01/13/2017  . Secondary hyperparathyroidism (HCC) 01/13/2017  . Bleeding from the nose 01/13/2017  . Fever 01/13/2017  . Chest pain on breathing   . SOB (shortness of breath)   . Gastroesophageal reflux disease   . Respiratory failure (HCC) 12/24/2016  . Acute pulmonary edema (HCC)   . End-stage renal disease on hemodialysis (HCC)   . Hypertensive urgency 12/02/2016  .  Leukocytosis 12/02/2016  . Nausea & vomiting 12/02/2016  . Hyperkalemia 12/02/2016  . Acute respiratory failure (HCC) 04/22/2016  . Diarrhea 04/22/2016  . HCAP (healthcare-associated pneumonia) 04/22/2016  . Tachycardia 04/22/2016  . Hypertension     Past Surgical History:  Procedure Laterality Date  . AV FISTULA PLACEMENT  12/2015  . RENAL BIOPSY  2013       Home Medications    Prior to Admission medications   Medication Sig Start Date End Date Taking? Authorizing Provider  amLODipine (NORVASC) 10 MG tablet Take 1 tablet (10 mg total) daily by mouth. 04/04/17   Jeanella Crazellis, Brandi L, NP  aspirin EC 81 MG EC tablet Take 1 tablet (81 mg total) by mouth daily. 01/16/17   Kathlen ModyAkula, Vijaya, MD  B Complex-C-Folic Acid (B COMPLEX-VITAMIN C-FOLIC ACID) 1 MG tablet Take 1 tablet by mouth daily. 04/30/16   [provider]  cinacalcet (SENSIPAR) 30 MG tablet Take 2 tablets (60 mg total) by mouth daily with supper. 12/04/16   Rolly SalterPatel, Pranav M, MD  cloNIDine (CATAPRES) 0.2 MG tablet Take 1 tablet (0.2 mg total) by mouth 3 (three) times daily. 12/27/16   Drema DallasWoods, Curtis J, MD  fluticasone (FLONASE) 50 MCG/ACT nasal spray Place 1 spray daily into both nostrils. 04/05/17   Jeanella Crazellis, Brandi L, NP  hydrALAZINE (APRESOLINE) 100 MG tablet Take 100 mg by  mouth 3 (three) times daily.  04/13/16   [provider]  HYDROcodone-acetaminophen (NORCO/VICODIN) 5-325 MG tablet Take 1-2 tablets by mouth every 6 (six) hours as needed for severe pain. 03/06/17   Narda BondsNettey, Ralph A, MD  isosorbide mononitrate (IMDUR) 30 MG 24 hr tablet Take 3 tablets (90 mg total) daily by mouth. 04/04/17   Jeanella Crazellis, Brandi L, NP  labetalol (NORMODYNE) 200 MG tablet Take 400 mg by mouth 3 (three) times daily. 03/16/16   [provider]  methocarbamol (ROBAXIN) 500 MG tablet Take 1 tablet (500 mg total) by mouth every 8 (eight) hours as needed for muscle spasms. 03/05/17   Narda BondsNettey, Ralph A, MD  sevelamer (RENAGEL) 800 MG tablet  Take 800-2,400 mg 3 (three) times daily with meals by mouth. Three with meals and 1 with snacks    [provider]    Family History Family History  Problem Relation Age of Onset  . Hypertension Mother   . Diabetes Father   . Hypertension Father   . Kidney disease Father   . Hypertension Maternal Grandmother   . Diabetes Paternal Grandmother   . Hypertension Paternal Grandmother     Social History Social History   Tobacco Use  . Smoking status: Never Smoker  . Smokeless tobacco: Never Used  Substance Use Topics  . Alcohol use: No  . Drug use: No     Allergies   Patient has no known allergies.   Review of Systems Review of Systems  Constitutional: Positive for appetite change and fatigue.  HENT: Negative for congestion.   Respiratory: Negative for shortness of breath.   Gastrointestinal: Positive for abdominal pain, nausea and vomiting. Negative for diarrhea.  Endocrine: Negative for polyuria.  Genitourinary: Negative for dysuria.  Musculoskeletal: Negative for back pain.  Neurological: Positive for headaches.  Hematological: Negative for adenopathy.  Psychiatric/Behavioral: Negative for confusion.     Physical Exam Updated Vital Signs BP (!) 181/101   Pulse (!) 106   Temp 98.2 F (36.8 C) (Oral)   Resp (!) 24   SpO2 97%   Physical Exam  Constitutional: He appears well-developed.  HENT:  Head: Atraumatic.  Patient appears uncomfortable  Eyes: Pupils are equal, round, and reactive to light.  Neck: Neck supple.  Cardiovascular: Normal rate.  Pulmonary/Chest: Breath sounds normal.  Abdominal: Soft. He exhibits no distension.  Mild upper and lower abdominal tenderness without rebound or guarding.  No hernias palpated.  Musculoskeletal: He exhibits no edema.  Neurological: He is alert.  Skin: Capillary refill takes less than 2 seconds.  Dialysis graft to left upper extremity.  Psychiatric: He has a normal mood and affect.     ED Treatments  / Results  Labs (all labs ordered are listed, but only abnormal results are displayed) Labs Reviewed  COMPREHENSIVE METABOLIC PANEL - Abnormal; Notable for the following components:      Result Value   Potassium 5.2 (*)    CO2 21 (*)    BUN 50 (*)    Creatinine, Ser 18.11 (*)    ALT 15 (*)    GFR calc non Af Amer 3 (*)    GFR calc Af Amer 4 (*)    Anion gap 17 (*)    All other components within normal limits  LIPASE, BLOOD - Abnormal; Notable for the following components:   Lipase 159 (*)    All other components within normal limits  CBC WITH DIFFERENTIAL/PLATELET - Abnormal; Notable for the following components:   Hemoglobin 12.8 (*)  RDW 16.1 (*)    All other components within normal limits    EKG  EKG Interpretation  Date/Time:  Tuesday April 29 2017 11:41:56 EST Ventricular Rate:  98 PR Interval:    QRS Duration: 97 QT Interval:  336 QTC Calculation: 429 R Axis:   79 Text Interpretation:  Sinus rhythm Nonspecific T abnormalities, inferior leads Borderline ST elevation, anterior leads Confirmed by Benjiman Core [phone]) on 04/29/2017 1:18:21 PM       Radiology No results found.  Procedures Procedures (including critical care time)  Medications Ordered in ED Medications  iopamidol (ISOVUE-300) 61 % injection (not administered)  promethazine (PHENERGAN) injection 12.5 mg (12.5 mg Intravenous Given 04/29/17 1241)  fentaNYL (SUBLIMAZE) injection 50 mcg (50 mcg Intravenous Given 04/29/17 1322)     Initial Impression / Assessment and Plan / ED Course  I have reviewed the triage vital signs and the nursing notes.  Pertinent labs & imaging results that were available during my care of the patient were reviewed by me and considered in my medical decision making (see chart for details).     Patient with nausea vomiting headache and hypertension.  Missed dialysis today.  Does have elevated lipase.  Has had previous abdominal pains but reviewing some of the  workup at [facility] and seen no previous pancreatitis.  Denies drinking alcohol.  Noncontrast CT scan 10 months ago did not show gallstones.  Did not show pancreatitis at that time either.  Did have elevated lipase during 1 of the ER admissions.  Continues to have nausea and vomiting here.  Will get CT scan to help evaluate but will admit to hospital for hypertension and continued vomiting.  Mild hyperkalemia but lungs are clear and does not appear to be severely volume overloaded.  CT is call me.  Radiologist states the patient cannot be scanned with contrast until after he has dialysis because it could potentially make him going to volume overload.  Does not appear to be volume overload at this time but I feel as if he can wait for the contrast CT scan until after dialysis is done.  I think the contrast would probably help delineate potential pancreatic pathology.  Final Clinical Impressions(s) / ED Diagnoses   Final diagnoses:  End stage renal disease on dialysis [facility])  Intractable vomiting with nausea, unspecified vomiting type  Elevated lipase    ED Discharge Orders    None       Benjiman Core, MD 04/29/17 1319    Benjiman Core, MD 04/29/17 [phone]

## 2017-04-29 NOTE — Consult Note (Signed)
Braulio Conte Admit Date: 04/29/2017 04/29/2017 Rexene Agent Requesting Physician:  Daryll Drown MD  Reason for Consult:  Co-management of ESRD HPI:  6M ESRD [facility] [place] came to ED today for acute on chronic abd pain (desribed as diffuse), and vomiting (nonbloody, nonbilious).  Last HD 12/8, full tx, but BP up pre/post tx which is common for him.  BP here doesn't appear different than in the kidney center.  Has hx/o admissions for HTN urgency/emergency, last occurred in Nov this year.    At time of my exam pt does not engage, only answers some questions and does so in a curt manner.    CT Abd ordered, not you completed.     He has been out of his clonidine for some time and has not yet gone to the pharmacy to have it refilled.    ED workup with lipase 159, nl AST/ALT, nl Tili and nl ALk Phos. Afebrile. WBC 8.9, Hb 12.8.    Creatinine, Ser (mg/dL)  Date Value  04/29/2017 18.11 (H)  04/22/2017 12.66 (H)  04/04/2017 20.00 (H)  04/03/2017 17.00 (H)  04/02/2017 23.93 (H)  04/01/2017 23.72 (H)  03/05/2017 14.89 (H)  03/04/2017 13.11 (H)  01/14/2017 12.73 (H)  01/13/2017 >18.00 (H)  ] I/Os:  ROS Balance of 12 systems is negative w/ exceptions as above  PMH  Past Medical History:  Diagnosis Date  . Acute respiratory failure ([place])   . ESRD (end stage renal disease) ([facility])    TTS  . FSGS (focal segmental glomerulosclerosis)   . Hypertension   . Nausea & vomiting 11/2016   PSH  Past Surgical History:  Procedure Laterality Date  . AV FISTULA PLACEMENT  12/2015  . RENAL BIOPSY  2013   FH  Family History  Problem Relation Age of Onset  . Hypertension Mother   . Diabetes Father   . Hypertension Father   . Kidney disease Father   . Hypertension Maternal Grandmother   . Diabetes Paternal Grandmother   . Hypertension Paternal Grandmother    [place]  reports that  has never smoked. he has never used smokeless tobacco. He reports that he does not drink alcohol or use  drugs. Allergies No Known Allergies Home medications Prior to Admission medications   Medication Sig Start Date End Date Taking? Authorizing Provider  amLODipine (NORVASC) 10 MG tablet Take 1 tablet (10 mg total) daily by mouth. 04/04/17  Yes Ollis, Brandi L, NP  cinacalcet (SENSIPAR) 30 MG tablet Take 2 tablets (60 mg total) by mouth daily with supper. Patient taking differently: Take 60 mg by mouth daily.  12/04/16  Yes Lavina Hamman, MD  cloNIDine (CATAPRES) 0.2 MG tablet Take 1 tablet (0.2 mg total) by mouth 3 (three) times daily. 12/27/16  Yes Allie Bossier, MD  hydrALAZINE (APRESOLINE) 100 MG tablet Take 100 mg by mouth 3 (three) times daily.  04/13/16  Yes [provider]  isosorbide mononitrate (IMDUR) 30 MG 24 hr tablet Take 3 tablets (90 mg total) daily by mouth. 04/04/17  Yes Ollis, Brandi L, NP  labetalol (NORMODYNE) 200 MG tablet Take 400 mg by mouth 3 (three) times daily. 03/16/16  Yes [provider]  methocarbamol (ROBAXIN) 500 MG tablet Take 1 tablet (500 mg total) by mouth every 8 (eight) hours as needed for muscle spasms. 03/05/17  Yes Mariel Aloe, MD  sevelamer (RENAGEL) 800 MG tablet Take 800-3,200 mg by mouth See admin instructions. 2,400-3,200 mg three times a day with meals and 800  mg with each snack   Yes [provider]  aspirin EC 81 MG EC tablet Take 1 tablet (81 mg total) by mouth daily. Patient not taking: Reported on 04/29/2017 01/16/17   Hosie Poisson, MD  B Complex-C-Folic Acid (B COMPLEX-VITAMIN C-FOLIC ACID) 1 MG tablet Take 1 tablet by mouth daily. 04/30/16   [provider]  fluticasone (FLONASE) 50 MCG/ACT nasal spray Place 1 spray daily into both nostrils. 04/05/17   Donita Brooks, NP  HYDROcodone-acetaminophen (NORCO/VICODIN) 5-325 MG tablet Take 1-2 tablets by mouth every 6 (six) hours as needed for severe pain. Patient not taking: Reported on 04/29/2017 03/06/17   Mariel Aloe, MD    Current  Medications Scheduled Meds: . [START ON 04/30/2017] amLODipine  10 mg Oral Daily  . cloNIDine  0.2 mg Oral TID  . [START ON 05/01/2017] doxercalciferol  6 mcg Intravenous Q T,Th,Sa-HD  . heparin  5,000 Units Subcutaneous Q8H  . [START ON 04/30/2017] hydrALAZINE  100 mg Oral TID  . iopamidol      . [START ON 04/30/2017] isosorbide mononitrate  90 mg Oral Daily  . [START ON 04/30/2017] labetalol  400 mg Oral TID   Continuous Infusions: PRN Meds:.acetaminophen **OR** acetaminophen, hydrALAZINE, HYDROcodone-acetaminophen, methocarbamol, promethazine, senna-docusate  CBC Recent Labs  Lab 04/29/17 1149  WBC 8.9  NEUTROABS 5.6  HGB 12.8*  HCT 39.3  MCV 87.1  PLT 734   Basic Metabolic Panel Recent Labs  Lab 04/29/17 1149  NA 139  K 5.2*  CL 101  CO2 21*  GLUCOSE 85  BUN 50*  CREATININE 18.11*  CALCIUM 10.1    Physical Exam  Blood pressure (!) 179/102, pulse 84, temperature 98.6 F (37 C), temperature source Oral, resp. rate 18, SpO2 100 %. GEN: restless in bed, poorly engaged, doesn't open eyes to converse ENT: NCAT EYES: EOMI CV:  2/6 MSM best at RUSB, no rub; nl s1s2, regular PULM: clear bilateraly, nl wob ABD: soft throughout, +BS, mildly TTP diffusely SKIN: no rashes/lesions EXT:no LEE LUE AVF +B/T   Outpt HD Orders Unit: [place] Days: [facility]S Time: 4h Dialyzer: F180 EDW: 84.5kg K/Ca: 2/2.25 Access: LUE AVF Needle Size: 15g BFR/DFR: 450/800 UF Proflie: 2 VDRA: hectorol 44mg qTx EPO: none currently IV Fe: none currently Heparin: 4600 IVB qTx Treatment Adherence: freq s/o early   Assessment 51M with abd pain, acute on chronic, and ESRD with missed HD, HTN   1. ESRD via LUE AVF [place] 2. HTN, chronically uncontrolled, non adherent with medications 3. N/V and Abd pain, mild elevated lipase 4. CKD BMD  Plan 1. HD tonight if spot available, 2K, try to get to EDW using AVF 2. CT Abd per primary 3. Reassess in AM.     RPearson Grippe MD [phone] 04/29/2017, 8:19 PM

## 2017-04-29 NOTE — H&P (Signed)
Date: 04/29/2017               Patient Name:  Jeremy GoldenDeanthony Huerta MRN: [id number]  DOB: June 14, 1995 Age / Sex: 21 y.o., male   PCP: Charlotte Sanesobinson, Todd Wayne, MD         Medical Service: Internal Medicine Teaching Service         Attending Physician: Dr. Inez CatalinaMullen, Emily B, MD    First Contact: Dr. Renaldo ReelHuang Pager: [phone]  Second Contact: Dr. 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  Has had extensive work up for abdominal pain and diarrhea in the past that started October 2015, including: - 02/2014: CT abd with diffuse thickening of distal small bowel - 07/2014: CT abd with diffuse SB thickening and dilation with mesenteric vascular engorgement - 03/2015: CT abd with submucosal edema and thickened SB particularly in jejunum. Suggests worsening enteritis (infxs vs inflamm vs less likely ischemic etiology vs angioedema). EGD with normal biopsy. Dietary angioedema work-up normal (nl C4 and C1 esterase inhibitor function). - 03/2015: Readmitted, CT with persistent but improved mural thickening involving multiple loops of fluid-filled mildly dilated jejunum. Thought to be related to potential IBD - 01/2016: EGD with gastritis and duodenal erosions, biopsies with gastritis. - 02/2016: positive rhino/enterovirus. C.diff with positive antigen and negative toxin - 04/2016: EGD and capsule endoscopy with nonspecific findings except gastritis and enteritis. Biopsies with gastritis. - 06/2016: CT abdomen with improved small bowel wall thickening - 07/2016: Colonoscopy with normal colon. Terminal ileum biopsy with focal  active inflammation.  Reports that his abdominal pain is dull and intermittent. Has had similar prior episodes, but he has not had them in a while. Endorses nausea and NB emesis x2-3 episodes, as well as decreased PO intake. Has not noticed an increase in pain related to food intake. Denies diarrhea or constipation. Makes scant urine. Denies fevers, chest pain, or shortness of breath. No recent travel or sick contacts.  Last dialysis session Saturday. Could not make it today due to transportation timing issues with [employer]. Denies smoking, alcohol use, or other illicit drugs. Lives at home with his roommate. States he took all of his BP medications this morning except clonidine. Has not taken clonidine in ~1 week because he ran out of it.  ED Course - BP 186/128, HR 106, RR 17, temp 98.2, O2 sats 97% on RA - Hb 12.8, K 5.2, bicarb 21, BUN 50, Cr 18.11, AG 17. Lipase 159 - EKG with sinus rhythm, T wave inversions in lead II, III, avF - CT abdomen/pelvis with contrast ordered but not able to be done due to patient's renal function.  Meds:  Current Meds  Medication Sig  . amLODipine (NORVASC) 10 MG tablet Take 1 tablet (10 mg total) daily by mouth.  . cinacalcet (SENSIPAR) 30 MG tablet Take 2 tablets (60 mg total) by mouth daily with supper. (Patient taking differently: Take 60 mg by mouth daily. )  . cloNIDine (CATAPRES) 0.2 MG tablet Take 1 tablet (0.2 mg total) by mouth 3 (three) times daily.  . hydrALAZINE (APRESOLINE) 100  MG tablet Take 100 mg by mouth 3 (three) times daily.   . isosorbide mononitrate (IMDUR) 30 MG 24 hr tablet Take 3 tablets (90 mg total) daily by mouth.  . labetalol (NORMODYNE) 200 MG tablet Take 400 mg by mouth 3 (three) times daily.  . methocarbamol (ROBAXIN) 500 MG tablet Take 1 tablet (500 mg total) by mouth every 8 (eight) hours as needed for muscle spasms.  . sevelamer (RENAGEL) 800 MG tablet Take 800-3,200 mg by mouth See admin instructions. 2,400-3,200 mg three times a  day with meals and 800 mg with each snack   Allergies: Allergies as of 04/29/2017  . (No Known Allergies)   Past Medical History:  Diagnosis Date  . Acute respiratory failure ([facility])   . ESRD (end stage renal disease) ([facility])    TTS  . FSGS (focal segmental glomerulosclerosis)   . Hypertension   . Nausea & vomiting 11/2016   Family History:  Family History  Problem Relation Age of Onset  . Hypertension Mother   . Diabetes Father   . Hypertension Father   . Kidney disease Father   . Hypertension Maternal Grandmother   . Diabetes Paternal Grandmother   . Hypertension Paternal Grandmother    Social History:  - denies smoking, alcohol use, or other illicit drugs - lives at home with a roommate  Review of Systems: - Endorses HA that is improved since admission A complete ROS was negative except as per HPI.  Physical Exam: Blood pressure (!) 173/106, pulse 90, temperature 98.2 F (36.8 C), temperature source Oral, resp. rate 19, SpO2 97 %. GEN: Well-appearing, well-nourished. Alert and oriented. No acute distress. HENT: [place]/AT. Moist mucous membranes. No visible lesions. EYES: Sclera non-icteric. Conjunctiva clear. RESP: Clear to auscultation bilaterally. No wheezes, rales, or rhonchi. No increased work of breathing. CV: Normal rate and regular rhythm. Flow murmur, best heard at LUSB. No LE edema. ABD: Soft. Diffusely tender to palpation, particularly in lower abdominal quadrants. Non-distended. Normoactive bowel sounds. EXT: No edema. Warm. 2+ DP pulses. NEURO: Cranial nerves II-XII grossly intact. Able to lift all four extremities against gravity. No apparent audiovisual hallucinations. Speech fluent and appropriate. PSYCH: Patient is calm and pleasant. Appropriate affect. Well-groomed; speech is appropriate and on-subject.  Labs CBC Latest Ref Rng & Units 04/29/2017 04/22/2017 04/04/2017  WBC 4.0 - 10.5 K/uL 8.9 7.7 7.0  Hemoglobin 13.0 - 17.0 g/dL 12.8(L) 13.6 11.9(L)    Hematocrit 39.0 - 52.0 % 39.3 41.9 37.0(L)  Platelets 150 - 400 K/uL 202 218 174   CMP Latest Ref Rng & Units 04/29/2017 04/22/2017 04/04/2017  Glucose 65 - 99 mg/dL 85 161(W125(H) 960(A112(H)  BUN 6 - 20 mg/dL 54(U50(H) 98(J32(H) 19(J57(H)  Creatinine 0.61 - 1.24 mg/dL 47.82(N18.11(H) 56.21(H12.66(H) 08.65(H20.00(H)  Sodium 135 - 145 mmol/L 139 141 135  Potassium 3.5 - 5.1 mmol/L 5.2(H) 3.6 4.3  Chloride 101 - 111 mmol/L 101 97(L) 98(L)  CO2 22 - 32 mmol/L 21(L) 30 24  Calcium 8.9 - 10.3 mg/dL 84.610.1 9.5 9.6(E8.6(L)  Total Protein 6.5 - 8.1 g/dL 7.9 - -  Total Bilirubin 0.3 - 1.2 mg/dL 0.5 - -  Alkaline Phos 38 - 126 U/L 59 - -  AST 15 - 41 U/L 19 - -  ALT 17 - 63 U/L 15(L) - -   Lipase 159  EKG: personally reviewed my interpretation is sinus rhythm. 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  Hypertensive urgency BP 186/128 on admission. Likely secondary to medication non-adherence and possibly a component of volume overload. Home regimen includes amlodipine 10 mg PO daily, hydralazine 100 mg PO TID, labetalol 400 mg PO TID, clonidine 0.2mg  PO TID, imdur 60 mg PO daily. Patient states he has taken all of these medications today except for clonidine, which he has not taken in ~1 week because he does not have any more refills. - Telemetry - Resume home amlodipine, hydralazine, labetalol, and isosorbide mononitrate tomorrow - Resume home clonidine today - IV hydralazine 10mg  q4h PRN for BP >180/100  Nausea, vomiting, abdominal pain Chronic and recurrent issue. Has had extensive GI work-up in the past with no clear etiology. Maybe idiopathic enteritis? Lipase 156 but reports his abdominal pain and N/V improved with medications he has received  in the ED. - CT abdomen with contrast when able - Phenergan 12.5mg  q6h PRN for nausea - tylenol and Norco PRN for pain  ESRD with HD TuThSa Last dialysis session Saturday. Missed today due to transportation timing issues. - Nephrology consulted; appreciate their assistance - RFP in AM  Diet: Advance diet as tolerated Code: Full code VTE PPx: SQH Dispo: Admit patient to Observation with expected length of stay less than 2 midnights.  Signed: Scherrie Gerlach, MD 04/29/2017, 6:08 PM  Pager: Demetrius Charity [phone]

## 2017-04-29 NOTE — ED Notes (Signed)
Patient transported to CT 

## 2017-04-30 ENCOUNTER — Observation Stay (HOSPITAL_COMMUNITY): Payer: Medicaid Other

## 2017-04-30 DIAGNOSIS — Z79899 Other long term (current) drug therapy: Secondary | ICD-10-CM | POA: Diagnosis not present

## 2017-04-30 DIAGNOSIS — Z992 Dependence on renal dialysis: Secondary | ICD-10-CM | POA: Diagnosis not present

## 2017-04-30 DIAGNOSIS — G8929 Other chronic pain: Secondary | ICD-10-CM | POA: Diagnosis not present

## 2017-04-30 DIAGNOSIS — I160 Hypertensive urgency: Secondary | ICD-10-CM | POA: Diagnosis not present

## 2017-04-30 DIAGNOSIS — N186 End stage renal disease: Secondary | ICD-10-CM | POA: Diagnosis not present

## 2017-04-30 DIAGNOSIS — R109 Unspecified abdominal pain: Secondary | ICD-10-CM | POA: Diagnosis not present

## 2017-04-30 LAB — MRSA PCR SCREENING: MRSA by PCR: NEGATIVE

## 2017-04-30 MED ORDER — IOPAMIDOL (ISOVUE-300) INJECTION 61%
INTRAVENOUS | Status: AC
  Administered 2017-04-30: 70 mL via INTRAVENOUS
  Filled 2017-04-30: qty 100

## 2017-04-30 MED ORDER — PENTAFLUOROPROP-TETRAFLUOROETH EX AERO: 1.0000 "application " | INHALATION_SPRAY | CUTANEOUS | Status: DC | PRN

## 2017-04-30 MED ORDER — HEPARIN SODIUM (PORCINE) 1000 UNIT/ML DIALYSIS: 20.0000 [IU]/kg | INTRAMUSCULAR | Status: DC | PRN

## 2017-04-30 MED ORDER — SODIUM CHLORIDE 0.9 % IV SOLN: 100.0000 mL | INTRAVENOUS | Status: DC | PRN

## 2017-04-30 MED ORDER — HEPARIN SODIUM (PORCINE) 1000 UNIT/ML DIALYSIS: 1000.0000 [IU] | INTRAMUSCULAR | Status: DC | PRN

## 2017-04-30 MED ORDER — RENA-VITE PO TABS: 1.0000 | ORAL_TABLET | Freq: Every day | ORAL | Status: DC

## 2017-04-30 MED ORDER — LIDOCAINE HCL (PF) 1 % IJ SOLN: 5.0000 mL | INTRAMUSCULAR | Status: DC | PRN

## 2017-04-30 MED ORDER — CLONIDINE HCL 0.2 MG PO TABS: 0.2000 mg | ORAL_TABLET | Freq: Three times a day (TID) | ORAL | 0 refills | Status: DC

## 2017-04-30 MED ORDER — ALTEPLASE 2 MG IJ SOLR: 2.0000 mg | Freq: Once | INTRAMUSCULAR | Status: DC | PRN

## 2017-04-30 MED ORDER — LIDOCAINE-PRILOCAINE 2.5-2.5 % EX CREA: 1.0000 "application " | TOPICAL_CREAM | CUTANEOUS | Status: DC | PRN

## 2017-04-30 NOTE — Progress Notes (Signed)
Patient discharged to home, AVS reviewed, IV removed, telebox returned 
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
## 2017-04-30 NOTE — Progress Notes (Signed)
   Subjective:  Jeremy Huerta was lying in bed comfortably this morning. Denies chest pain or shortness of breath. States his abdominal pain is improved. Denies current N/V. Did have a few episodes of diarrhea. Tolerated clears for breakfast this morning.  Objective:  Vital signs in last 24 hours: Vitals:   04/30/17 0230 04/30/17 0300 04/30/17 0314 04/30/17 0531  BP: 140/78 130/77 (!) 147/72 (!) 143/85  Pulse: 80 80 78 86  Resp: 16 15 15 16   Temp:  98 F (36.7 C) 98 F (36.7 C) 98.5 F (36.9 C)  TempSrc:  Oral    SpO2:  100% 100% 100%  Weight:  185 lb 3 oz (84 kg)     GEN: Well-appearing, young male lying in bed in NAD. Alert and oriented. RESP: Clear to auscultation bilaterally. No wheezes, rales, or rhonchi. No increased work of breathing. CV: Normal rate and regular rhythm. No murmurs, gallops, or rubs. No LE edema. ABD: Soft. TTP in lower abdomen > upper abdomen. Non-distended. Normoactive bowel sounds. No rebound or guarding. EXT: No edema. Warm and well perfused. NEURO: Cranial nerves II-XII grossly intact. Able to lift all four extremities against gravity. No apparent audiovisual hallucinations. Speech fluent and appropriate. PSYCH: Patient is calm and pleasant. Appropriate affect. Well-groomed; speech is appropriate and on-subject.  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  Hypertensive urgency BP 186/128 on admission, now improved to 143/85. Likely secondary to medication non-adherence and possibly a component of volume overload. Home regimen includes amlodipine 10 mg PO daily, hydralazine 100 mg PO TID, labetalol 400 mg PO TID, clonidine 0.2mg  PO TID, imdur 60 mg PO daily. - Telemetry - Continue home amlodipine, hydralazine, labetalol, clonidine, and isosorbide  mononitrate  Nausea, vomiting, abdominal pain, diarrhea Chronic and recurrent issue. Has had extensive GI work-up in the past with no clear etiology. Maybe idiopathic enteritis? Lipase 156 but reports his abdominal pain and N/V improved with medications he has received in the ED. CT abdomen/pelvis nonspecific but some ?mild small bowel thickening and moderate intraperitoneal fluid. - Phenergan 12.5mg  q6h PRN for nausea - tylenol and Norco PRN for pain - Advance to regular diet for lunch - If tolerating PO intake after lunch, can likely d/c today  ESRD with HD TuThSa Received dialysis last night - Nephrology consulted; appreciate their assistance  Dispo: Anticipated discharge in approximately 0-1 day(s).   Scherrie GerlachHuang, Jurnei Latini, MD 04/30/2017, 8:48 AM Pager: Demetrius CharityP [phone]

## 2017-04-30 NOTE — Progress Notes (Signed)
[facility] Progress Note   Dialysis Orders: Unit: [facility] 4h  F180 EDW 84.5kg K/Ca: 2/2.25 Access: LUE AVF  15g  450/800 UF Proflie: 2 VDRA: hectorol 6mcg qTx no ESA or Fe heparin 4600 Treatment Adherence: freq s/o early   Assessment/Plan: 1. Abdominal pain with vomiting - lipase 159 WBC 8.9 - mod intraperitoneal fluid- no air - mild small bowel wall thickening -  2. ESRD -TTS HD K 5.2 pre HD yesterday - next HD Thursday first round if not d/c today 3. Anemia - hgb 12.8 no ESA orFe 4. Secondary hyperparathyroidism - hectorol/binders 5. HTN/volume - BP poorly controlled as an outpatient - noncompliance with meds - not picking up Rx net UF 2.7 with post wt  84 - lower edw to 84 for d/c 6. Nutrition - renal diet/vit 7. Disp - ok per renal for d/c today  Sheffield SliderMartha B Bergman, PA-C  [facility] [phone] 04/30/2017,10:28 AM  LOS: 0 days   Pt seen, examined and agree w A/P as above.  Vinson Moselleob Geraldine Sandberg MD [facility] pager [phone]   04/30/2017, 2:25 PM    Subjective:   Feels better. Ordering lunch. Tells me pharmacy didn't have clonidine but I confirmed with his RN at dialysis yesterday that it was called in.  Objective Vitals:   04/30/17 0300 04/30/17 0314 04/30/17 0531 04/30/17 0900  BP: 130/77 (!) 147/72 (!) 143/85 (!) 178/96  Pulse: 80 78 86 79  Resp: 15 15 16 18   Temp: 98 F (36.7 C) 98 F (36.7 C) 98.5 F (36.9 C) 99.1 F (37.3 C)  TempSrc: Oral   Oral  SpO2: 100% 100% 100% 100%  Weight: 84 kg (185 lb 3 oz)      Physical Exam General: looks much better. Walking in room. Talking on the phone Heart: RRR Lungs: no rales Abdomen: soft min tenderness Extremities: no LE edema Dialysis Access: left upper AVF + bruit   Additional Objective Labs: Basic Metabolic Panel: Recent Labs  Lab 04/29/17 1149  NA 139  K 5.2*  CL 101  CO2 21*  GLUCOSE 85  BUN 50*  CREATININE 18.11*  CALCIUM 10.1    Liver Function Tests: Recent Labs  Lab 04/29/17 1149  AST 19  ALT 15*  ALKPHOS 59  BILITOT 0.5  PROT 7.9  ALBUMIN 4.2   Recent Labs  Lab 04/29/17 1149  LIPASE 159*   CBC: Recent Labs  Lab 04/29/17 1149  WBC 8.9  NEUTROABS 5.6  HGB 12.8*  HCT 39.3  MCV 87.1  PLT 202   Blood Culture    Component Value Date/Time   SDES BLOOD AVF 01/13/2017 1900   SPECREQUEST  01/13/2017 1900    BOTTLES DRAWN AEROBIC AND ANAEROBIC Blood Culture results may not be optimal due to an excessive volume of blood received in culture bottles   CULT NO GROWTH 5 DAYS 01/13/2017 1900   REPTSTATUS 01/18/2017 FINAL 01/13/2017 1900    Cardiac Enzymes: No results for input(s): CKTOTAL, CKMB, CKMBINDEX, TROPONINI in the last 168 hours. CBG: No results for input(s): GLUCAP in the last 168 hours. Iron Studies: No results for input(s): IRON, TIBC, TRANSFERRIN, FERRITIN in the last 72 hours. No results found for: INR, PROTIME Studies/Results: Ct Abdomen Pelvis W Contrast  Result Date: 04/30/2017 CLINICAL DATA:  Periumbilical pain, nausea and vomiting over the last 2-3 days. Hemodialysis patient. EXAM: CT ABDOMEN AND PELVIS WITH CONTRAST TECHNIQUE: Multidetector CT imaging of the abdomen and pelvis was performed using the  standard protocol following bolus administration of intravenous contrast. CONTRAST:  70 cc Isovue-300 COMPARISON:  None. FINDINGS: Lower chest: Cardiomegaly.  Lungs clear.  No pleural fluid. Hepatobiliary: Liver parenchyma normal.  No calcified gallstones. Pancreas: Normal Spleen: Normal Adrenals/Urinary Tract: Adrenal glands are normal. Native kidneys are small and not functioning or poorly functioning. Stomach/Bowel: There is free intraperitoneal fluid but no sign of intraperitoneal air. I do not identify any focal bowel pathology. No sign that this is a peritoneal dialysis patient. One could question if there was mild small bowel thickening which could go along with enteritis,  nonspecific. Vascular/Lymphatic: Normal Reproductive: Normal Other: None Musculoskeletal: Normal IMPRESSION: Moderate amount of intraperitoneal fluid, but without a discernible etiology. No free intraperitoneal air. I cannot identify any focal bowel pathology on this exam done without oral contrast. One could question if there was mild small bowel wall thickening which could go along with enteritis. Consider repeat scan with oral contrast. The differential diagnosis would include peritonitis in acute bowel syndromes. I would note that the patient did not have an elevated white count last night however. Small, poorly functioning or nonfunctioning native kidneys. Enlarged heart. Electronically Signed   By: Paulina FusiMark  Shogry M.D.   On: 04/30/2017 07:32   Medications: . sodium chloride    . sodium chloride     . amLODipine  10 mg Oral Daily  . chlorhexidine  15 mL Mouth Rinse BID  . cloNIDine  0.2 mg Oral TID  . [START ON 05/01/2017] doxercalciferol  6 mcg Intravenous Q T,Th,Sa-HD  . heparin  5,000 Units Subcutaneous Q8H  . hydrALAZINE  100 mg Oral TID  . isosorbide mononitrate  90 mg Oral Daily  . labetalol  400 mg Oral TID  . mouth rinse  15 mL Mouth Rinse q12n4p

## 2017-04-30 NOTE — Discharge Instructions (Signed)
Please follow up with your GI doctor. Please talk to your doctor about possibly considering elimination diets or a food diary to help figure out why you keep having abdominal pain.  Please continue your outpatient dialysis as scheduled.  Please continue taking your blood pressure medications, including amlodipine, clonidine, hydralazine, labetalol, and Imdur. I have sent a refill for clonidine to your pharmacy.

## 2017-05-06 ENCOUNTER — Encounter (HOSPITAL_COMMUNITY): Payer: Self-pay | Admitting: *Deleted

## 2017-05-06 ENCOUNTER — Emergency Department (HOSPITAL_COMMUNITY): Payer: Medicaid Other

## 2017-05-06 ENCOUNTER — Emergency Department (HOSPITAL_COMMUNITY)
Admission: EM | Admit: 2017-05-06 | Discharge: 2017-05-06 | Disposition: A | Discharge status: Home / Self Care | Payer: Medicaid Other | Attending: Emergency Medicine | Admitting: Emergency Medicine

## 2017-05-06 DIAGNOSIS — N186 End stage renal disease: Secondary | ICD-10-CM | POA: Diagnosis not present

## 2017-05-06 DIAGNOSIS — Z992 Dependence on renal dialysis: Secondary | ICD-10-CM | POA: Insufficient documentation

## 2017-05-06 DIAGNOSIS — R197 Diarrhea, unspecified: Secondary | ICD-10-CM

## 2017-05-06 DIAGNOSIS — R1110 Vomiting, unspecified: Secondary | ICD-10-CM

## 2017-05-06 DIAGNOSIS — I120 Hypertensive chronic kidney disease with stage 5 chronic kidney disease or end stage renal disease: Secondary | ICD-10-CM | POA: Insufficient documentation

## 2017-05-06 DIAGNOSIS — Z79899 Other long term (current) drug therapy: Secondary | ICD-10-CM | POA: Insufficient documentation

## 2017-05-06 DIAGNOSIS — R1013 Epigastric pain: Secondary | ICD-10-CM | POA: Insufficient documentation

## 2017-05-06 DIAGNOSIS — R748 Abnormal levels of other serum enzymes: Secondary | ICD-10-CM | POA: Diagnosis not present

## 2017-05-06 DIAGNOSIS — R112 Nausea with vomiting, unspecified: Secondary | ICD-10-CM | POA: Diagnosis not present

## 2017-05-06 LAB — COMPREHENSIVE METABOLIC PANEL
ALT: 25 U/L (ref 17–63)
AST: 27 U/L (ref 15–41)
Albumin: 4.3 g/dL (ref 3.5–5.0)
Alkaline Phosphatase: 59 U/L (ref 38–126)
Anion gap: 16 — ABNORMAL HIGH (ref 5–15)
BUN: 70 mg/dL — AB (ref 6–20)
CHLORIDE: 101 mmol/L (ref 101–111)
CO2: 24 mmol/L (ref 22–32)
CREATININE: 20.31 mg/dL — AB (ref 0.61–1.24)
Calcium: 9.8 mg/dL (ref 8.9–10.3)
GFR calc non Af Amer: 3 mL/min — ABNORMAL LOW (ref >60)
GFR, EST AFRICAN AMERICAN: 3 mL/min — AB (ref >60)
Glucose, Bld: 95 mg/dL (ref 65–99)
POTASSIUM: 5.4 mmol/L — AB (ref 3.5–5.1)
SODIUM: 141 mmol/L (ref 135–145)
Total Bilirubin: 0.6 mg/dL (ref 0.3–1.2)
Total Protein: 7.7 g/dL (ref 6.5–8.1)

## 2017-05-06 LAB — CBC
HEMATOCRIT: 37.9 % — AB (ref 39.0–52.0)
Hemoglobin: 12.4 g/dL — ABNORMAL LOW (ref 13.0–17.0)
MCH: 28.6 pg (ref 26.0–34.0)
MCHC: 32.7 g/dL (ref 30.0–36.0)
MCV: 87.3 fL (ref 78.0–100.0)
PLATELETS: 222 10*3/uL (ref 150–400)
RBC: 4.34 MIL/uL (ref 4.22–5.81)
RDW: 15.9 % — ABNORMAL HIGH (ref 11.5–15.5)
WBC: 10.3 10*3/uL (ref 4.0–10.5)

## 2017-05-06 LAB — LIPASE, BLOOD: LIPASE: 247 U/L — AB (ref 11–51)

## 2017-05-06 MED ORDER — ONDANSETRON HCL 4 MG/2ML IJ SOLN
4.0000 mg | Freq: Once | INTRAMUSCULAR | Status: AC
  Administered 2017-05-06: 4 mg via INTRAVENOUS
  Filled 2017-05-06: qty 2

## 2017-05-06 MED ORDER — HYDROCODONE-ACETAMINOPHEN 5-325 MG PO TABS: 1.0000 | ORAL_TABLET | Freq: Four times a day (QID) | ORAL | 0 refills | Status: DC | PRN

## 2017-05-06 MED ORDER — MORPHINE SULFATE (PF) 4 MG/ML IV SOLN
4.0000 mg | Freq: Once | INTRAVENOUS | Status: AC
  Administered 2017-05-06: 4 mg via INTRAVENOUS
  Filled 2017-05-06: qty 1

## 2017-05-06 MED ORDER — ONDANSETRON 4 MG PO TBDP: 4.0000 mg | ORAL_TABLET | Freq: Three times a day (TID) | ORAL | 0 refills | Status: DC | PRN

## 2017-05-06 MED ORDER — ONDANSETRON 4 MG PO TBDP: 4.0000 mg | ORAL_TABLET | Freq: Once | ORAL | Status: DC | PRN

## 2017-05-06 NOTE — ED Triage Notes (Signed)
Pt here for N/V/D since 8pm. Pt had HD on T/TH/S and had his last full treatment on Saturday.

## 2017-05-06 NOTE — Discharge Instructions (Signed)
You were seen today for abdominal pain, vomiting, and diarrhea.  This may be related to your pancreas.  Your lipase test is somewhat elevated.  Make sure to limit your diet to liquids and broth and stay hydrated.  Take pain and nausea medication as needed.  If you develop worsening symptoms you need to be reevaluated immediately.

## 2017-05-06 NOTE — ED Provider Notes (Signed)
[facility] EMERGENCY DEPARTMENT Provider Note   CSN: [id number] Arrival date & time: 05/06/17  0154     History   Chief Complaint Chief Complaint  Patient presents with  . Emesis    HPI Jeremy Huerta is a 21 y.o. male.  HPI  This is a 21 year old male with a history of end-stage renal disease, hypertension, nausea and vomiting who presents with nausea, vomiting, abdominal pain, diarrhea.  Patient reports worsening over the last 24 hours.  Currently rates his pain at 8 out of 10.  Denies fevers.  Reports normal bowel movements.  Dialyzes on Tuesday, Thursday, and Saturday.  Reports full dialysis session on Saturday.  Denies any fevers.  Recent history of the same.  He had a fairly reassuring CT scan on 04/29/17.  At that time his lipase was noted to be elevated to 159.  No known history of gallstones or hypercholesterolemia.  Denies alcohol use.  Past Medical History:  Diagnosis Date  . Acute respiratory failure (HCC)   . ESRD (end stage renal disease) (HCC)    TTS  . FSGS (focal segmental glomerulosclerosis)   . Hypertension   . Nausea & vomiting 11/2016    Patient Active Problem List   Diagnosis Date Noted  . Vomiting 04/29/2017  . Skin abscess 03/07/2017  . 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 Back pain 03/05/2017  . Hypertensive emergency 01/13/2017  . History of anemia due to chronic kidney disease 01/13/2017  . Secondary hyperparathyroidism (HCC) 01/13/2017  . Bleeding from the nose 01/13/2017  . Fever 01/13/2017  . Chest pain on breathing   . SOB (shortness of breath)   . Gastroesophageal reflux disease   . Respiratory failure (HCC) 12/24/2016  . Acute pulmonary edema (HCC)   . End-stage renal disease on hemodialysis (HCC)   . Hypertensive urgency 12/02/2016  . Leukocytosis 12/02/2016  . Nausea & vomiting 12/02/2016  . Hyperkalemia 12/02/2016  . Acute respiratory failure (HCC) 04/22/2016  . Diarrhea 04/22/2016  . HCAP  (healthcare-associated pneumonia) 04/22/2016  . Tachycardia 04/22/2016  . Hypertension     Past Surgical History:  Procedure Laterality Date  . AV FISTULA PLACEMENT  12/2015  . RENAL BIOPSY  2013       Home Medications    Prior to Admission medications   Medication Sig Start Date End Date Taking? Authorizing Provider  amLODipine (NORVASC) 10 MG tablet Take 1 tablet (10 mg total) daily by mouth. 04/04/17  Yes Ollis, Brandi L, NP  B Complex-C-Folic Acid (B COMPLEX-VITAMIN C-FOLIC ACID) 1 MG tablet Take 1 tablet by mouth daily. 04/30/16  Yes [provider]  cinacalcet (SENSIPAR) 30 MG tablet Take 2 tablets (60 mg total) by mouth daily with supper. Patient taking differently: Take 60 mg by mouth daily.  12/04/16  Yes Rolly Salter, MD  cloNIDine (CATAPRES) 0.2 MG tablet Take 1 tablet (0.2 mg total) by mouth 3 (three) times daily. 04/30/17  Yes Scherrie Gerlach, MD  fluticasone [facility]) 50 MCG/ACT nasal spray Place 1 spray daily into both nostrils. 04/05/17  Yes Ollis, Brandi L, NP  hydrALAZINE (APRESOLINE) 100 MG tablet Take 100 mg by mouth 3 (three) times daily.  04/13/16  Yes [provider]  isosorbide mononitrate (IMDUR) 30 MG 24 hr tablet Take 3 tablets (90 mg total) daily by mouth. 04/04/17  Yes Ollis, Brandi L, NP  labetalol (NORMODYNE) 200 MG tablet Take 400 mg by mouth 3 (three) times daily. 03/16/16  Yes [provider]  methocarbamol (ROBAXIN) 500 MG tablet Take 1 tablet (500 mg total) by mouth every 8 (eight) hours as needed for muscle spasms. 03/05/17  Yes Narda Bonds, MD  sevelamer (RENAGEL) 800 MG tablet Take 800-3,200 mg by mouth See admin instructions. 2,400-3,200 mg three times a day with meals and 800 mg with each snack   Yes [provider]  HYDROcodone-acetaminophen (NORCO/VICODIN) 5-325 MG tablet Take 1-2 tablets by mouth every 6 (six) hours as needed. 05/06/17   Allina Riches, Mayer Masker, MD  ondansetron (ZOFRAN ODT) 4 MG  disintegrating tablet Take 1 tablet (4 mg total) by mouth every 8 (eight) hours as needed for nausea or vomiting. 05/06/17   Tonda Wiederhold, Mayer Masker, MD    Family History Family History  Problem Relation Age of Onset  . Hypertension Mother   . Diabetes Father   . Hypertension Father   . Kidney disease Father   . Hypertension Maternal Grandmother   . Diabetes Paternal Grandmother   . Hypertension Paternal Grandmother     Social History Social History   Tobacco Use  . Smoking status: Never Smoker  . Smokeless tobacco: Never Used  Substance Use Topics  . Alcohol use: No  . Drug use: No     Allergies   Patient has no known allergies.   Review of Systems Review of Systems  Constitutional: Negative for fever.  Respiratory: Negative for shortness of breath.   Cardiovascular: Negative for chest pain.  Gastrointestinal: Positive for abdominal pain, nausea and vomiting. Negative for constipation.  All other systems reviewed and are negative.    Physical Exam Updated Vital Signs BP (!) 163/103   Pulse 85   Temp 97.7 F (36.5 C) (Oral)   Resp 20   SpO2 99%   Physical Exam  Constitutional: He is oriented to person, place, and time. He appears well-developed and well-nourished. No distress.  HENT:  Head: Normocephalic and atraumatic.  Cardiovascular: Normal rate, regular rhythm and normal heart sounds.  No murmur heard. Pulmonary/Chest: Effort normal and breath sounds normal. No respiratory distress. He has no wheezes.  Abdominal: Soft. There is tenderness. There is no rebound.  Hyperactive bowel sounds, mild tenderness to palpation of the epigastrium, no rebound or guarding  Musculoskeletal: He exhibits no edema.  Fistula left upper extremity with positive thrill  Neurological: He is alert and oriented to person, place, and time.  Skin: Skin is warm and dry.  Psychiatric: He has a normal mood and affect.  Nursing note and vitals reviewed.    ED Treatments / Results    Labs (all labs ordered are listed, but only abnormal results are displayed) Labs Reviewed  LIPASE, BLOOD - Abnormal; Notable for the following components:      Result Value   Lipase 247 (*)    All other components within normal limits  COMPREHENSIVE METABOLIC PANEL - Abnormal; Notable for the following components:   Potassium 5.4 (*)    BUN 70 (*)    Creatinine, Ser 20.31 (*)    GFR calc non Af Amer 3 (*)    GFR calc Af Amer 3 (*)    Anion gap 16 (*)    All other components within normal limits  CBC - Abnormal; Notable for the following components:   Hemoglobin 12.4 (*)    HCT 37.9 (*)    RDW 15.9 (*)    All other components within normal limits  URINALYSIS, ROUTINE W REFLEX MICROSCOPIC    EKG  EKG Interpretation None  Radiology Dg Abdomen Acute W/chest  Result Date: 05/06/2017 CLINICAL DATA:  Mid abdominal pain.  Vomiting. EXAM: DG ABDOMEN ACUTE W/ 1V CHEST COMPARISON:  CT 6 days prior 04/30/2017 FINDINGS: The heart is enlarged. Mild pulmonary edema. No consolidation or pleural effusion. There is no free intra-abdominal air. Prominent small bowel in the central abdomen measuring 3.7 cm with air-fluid level. No significant formed stool in the colon. No radiopaque calculi. No acute osseous abnormalities are seen. IMPRESSION: 1. Cardiomegaly with mild pulmonary edema. 2. Prominent loop of small bowel in the central abdomen with air-fluid level, favor ileus or enteritis. Recommend continued radiographic follow-up to exclude early/developing small bowel obstruction. Electronically Signed   By: Rubye OaksMelanie  Ehinger M.D.   On: 05/06/2017 05:19   [place] Abdomen Limited Ruq  Result Date: 05/06/2017 CLINICAL DATA:  Initial evaluation for elevated lipase. EXAM: ULTRASOUND ABDOMEN LIMITED RIGHT UPPER QUADRANT COMPARISON:  None. FINDINGS: Gallbladder: No gallstones or wall thickening visualized. No sonographic Murphy sign noted by sonographer. Common bile duct: Diameter: 2.4 mm Liver: No  focal lesion identified. Echogenicity felt to be within normal limits. Portal vein is patent on color Doppler imaging with normal direction of blood flow towards the liver. IMPRESSION: Normal right upper quadrant ultrasound. No evidence for cholelithiasis, acute cholecystitis, or biliary dilatation. Electronically Signed   By: Rise MuBenjamin  McClintock M.D.   On: 05/06/2017 05:34    Procedures Procedures (including critical care time)  Medications Ordered in ED Medications  ondansetron (ZOFRAN-ODT) disintegrating tablet 4 mg (not administered)  morphine 4 MG/ML injection 4 mg (4 mg Intravenous Given 05/06/17 0427)  ondansetron (ZOFRAN) injection 4 mg (4 mg Intravenous Given 05/06/17 0427)  ondansetron (ZOFRAN) injection 4 mg (4 mg Intravenous Given 05/06/17 [id number])     Initial Impression / Assessment and Plan / ED Course  I have reviewed the triage vital signs and the nursing notes.  Pertinent labs & imaging results that were available during my care of the patient were reviewed by me and considered in my medical decision making (see chart for details).     Patient presents with abdominal pain, nausea, vomiting, diarrhea.  Overall nontoxic appearing.  Hypertensive.  Due for dialysis today.  Minimal epigastric tenderness on exam.  Patient was given pain and nausea medication.  Lipase noted to be elevated at 247.  He has previously had similar elevations in lipase.  Denies alcohol consumption or gallbladder disease.  Right upper quadrant ultrasound ordered and normal.  On recheck, patient states that he is feeling better.  He is able to tolerate fluids.  Feel that it is reasonable but if the patient can stay hydrated and have pain control, he can be discharged home.  Recommend bland fluid diet.  Patient will go directly to dialysis.  He was sent home with a short course of Zofran and Norco.  Follow-up with gastroenterology recommended.  After history, exam, and medical workup I feel the patient has  been appropriately medically screened and is safe for discharge home. Pertinent diagnoses were discussed with the patient. Patient was given return precautions.   Final Clinical Impressions(s) / ED Diagnoses   Final diagnoses:  Elevated lipase  Epigastric pain  Vomiting and diarrhea    ED Discharge Orders        Ordered    ondansetron (ZOFRAN ODT) 4 MG disintegrating tablet  Every 8 hours PRN     05/06/17 0625    HYDROcodone-acetaminophen (NORCO/VICODIN) 5-325 MG tablet  Every 6 hours PRN     05/06/17 [id number]  Shon BatonHorton, Eithen Castiglia F, MD 05/06/17 [phone]

## 2017-06-10 ENCOUNTER — Emergency Department (HOSPITAL_COMMUNITY)
Admission: EM | Admit: 2017-06-10 | Discharge: 2017-06-10 | Disposition: A | Discharge status: Home / Self Care | Payer: Medicaid Other | Attending: Emergency Medicine | Admitting: Emergency Medicine

## 2017-06-10 ENCOUNTER — Other Ambulatory Visit: Payer: Self-pay

## 2017-06-10 ENCOUNTER — Encounter (HOSPITAL_COMMUNITY): Payer: Self-pay | Admitting: Emergency Medicine

## 2017-06-10 DIAGNOSIS — N186 End stage renal disease: Secondary | ICD-10-CM | POA: Diagnosis not present

## 2017-06-10 DIAGNOSIS — Z79899 Other long term (current) drug therapy: Secondary | ICD-10-CM | POA: Diagnosis not present

## 2017-06-10 DIAGNOSIS — Z789 Other specified health status: Secondary | ICD-10-CM

## 2017-06-10 DIAGNOSIS — I120 Hypertensive chronic kidney disease with stage 5 chronic kidney disease or end stage renal disease: Secondary | ICD-10-CM | POA: Insufficient documentation

## 2017-06-10 DIAGNOSIS — Z452 Encounter for adjustment and management of vascular access device: Secondary | ICD-10-CM | POA: Diagnosis not present

## 2017-06-10 DIAGNOSIS — Z992 Dependence on renal dialysis: Secondary | ICD-10-CM | POA: Insufficient documentation

## 2017-06-10 LAB — I-STAT CHEM 8, ED
BUN: 64 mg/dL — AB (ref 6–20)
CALCIUM ION: 1.08 mmol/L — AB (ref 1.15–1.40)
Chloride: 104 mmol/L (ref 101–111)
Creatinine, Ser: >18 mg/dL — ABNORMAL HIGH (ref 0.61–1.24)
GLUCOSE: 105 mg/dL — AB (ref 65–99)
HEMATOCRIT: 30 % — AB (ref 39.0–52.0)
HEMOGLOBIN: 10.2 g/dL — AB (ref 13.0–17.0)
Potassium: 5.1 mmol/L (ref 3.5–5.1)
Sodium: 139 mmol/L (ref 135–145)
TCO2: 23 mmol/L (ref 22–32)

## 2017-06-10 LAB — I-STAT TROPONIN, ED: Troponin i, poc: 0.03 ng/mL (ref 0.00–0.08)

## 2017-06-10 NOTE — Discharge Instructions (Signed)
Please go directly to dialysis. Return to the ED with any worsening symptoms.

## 2017-06-10 NOTE — Discharge Planning (Signed)
EDCM consulted to help with transportation to [facility].  EDCM spoke with pt at bedside to confirm pt did not have resources for ride to HD.  Pt stated he did not set up ride with Medicaid in time and did not have transportation.  States he thought EMS would take him to HD.  EDCM explained that we would provide transportation TO HD, but he had to get ride home and that this would be a ONE TIME provision.  No further EDCM needs identified at this time.  Ikran Patman J. Lucretia RoersWood, RN, BSN, [place] [phone]

## 2017-06-10 NOTE — ED Triage Notes (Signed)
Pt arrives via EMs from home with T,TH, SAT dialysis. Missed Sat appt. Unable to make ride for todays appt. Arrives c/o SOB, abdominal pain. Denies recent fever, vomiting. +nausea. CBG 143.

## 2017-06-10 NOTE — ED Provider Notes (Signed)
[facility]ENC[facility] Provider Note   CSN: [id number] Arrival date & time: 06/10/17  0840     History   Chief Complaint Chief Complaint  Patient presents with  . Vascular Access Problem    HPI Jeremy GoldenDeanthony Schaper is a 22 y.o. male.  HPI 22 year old African-American male past medical history significant for ESRD on hemodialysis, hypertension that presents to the emergency department today needing dialysis.  Patient states that he is usually dialyzed every Tuesdays, Thursdays, Saturdays.  Patient missed his dialysis last Saturday due to being unable to find a ride.  Patient states that he was not able to make it to dialysis this morning because his ride fell through.  Patient states that he called EMS to transfer him to the ED so that he could possibly have dialysis at the hospital.  Patient reports chronic abdominal pain.  He does report some chest discomfort that states is baseline for patient when he needs dialysis.  Patient denies any associated shortness of breath, headache, vision changes, lightheadedness or dizziness.  Patient has not taken his blood pressure medicine this morning.  But nothing makes his symptoms better or worse.  Pt denies any fever, chill, ha, vision changes, lightheadedness, dizziness, congestion, neck pain, sob, cough, n/v/d, urinary symptoms, change in bowel habits, melena, hematochezia, lower extremity paresthesias.  Past Medical History:  Diagnosis Date  . Acute respiratory failure (HCC)   . ESRD (end stage renal disease) (HCC)    TTS  . FSGS (focal segmental glomerulosclerosis)   . Hypertension   . Nausea & vomiting 11/2016    Patient Active Problem List   Diagnosis Date Noted  . Vomiting 04/29/2017  . Skin abscess 03/07/2017  . 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 Back pain 03/05/2017  . Hypertensive emergency 01/13/2017  . History of anemia due to chronic kidney disease 01/13/2017  . Secondary hyperparathyroidism  (HCC) 01/13/2017  . Bleeding from the nose 01/13/2017  . Fever 01/13/2017  . Chest pain on breathing   . SOB (shortness of breath)   . Gastroesophageal reflux disease   . Respiratory failure (HCC) 12/24/2016  . Acute pulmonary edema (HCC)   . End-stage renal disease on hemodialysis (HCC)   . Hypertensive urgency 12/02/2016  . Leukocytosis 12/02/2016  . Nausea & vomiting 12/02/2016  . Hyperkalemia 12/02/2016  . Acute respiratory failure (HCC) 04/22/2016  . Diarrhea 04/22/2016  . HCAP (healthcare-associated pneumonia) 04/22/2016  . Tachycardia 04/22/2016  . Hypertension     Past Surgical History:  Procedure Laterality Date  . AV FISTULA PLACEMENT  12/2015  . RENAL BIOPSY  2013       Home Medications    Prior to Admission medications   Medication Sig Start Date End Date Taking? Authorizing Provider  amLODipine (NORVASC) 10 MG tablet Take 1 tablet (10 mg total) daily by mouth. 04/04/17  Yes Ollis, Brandi L, NP  B Complex-C-Folic Acid (B COMPLEX-VITAMIN C-FOLIC ACID) 1 MG tablet Take 1 tablet by mouth daily. 04/30/16  Yes [provider]  cinacalcet (SENSIPAR) 30 MG tablet Take 2 tablets (60 mg total) by mouth daily with supper. Patient taking differently: Take 60 mg by mouth daily.  12/04/16  Yes Rolly SalterPatel, Pranav M, MD  cloNIDine (CATAPRES) 0.2 MG tablet Take 1 tablet (0.2 mg total) by mouth 3 (three) times daily. 04/30/17  Yes Scherrie GerlachHuang, Jennifer, MD  fluticasone [facility](FLONASE) 50 MCG/ACT nasal spray Place 1 spray daily into both nostrils. Patient taking differently: Place 1 spray into both nostrils  daily as needed for allergies or rhinitis.  04/05/17  Yes Ollis, Brandi L, NP  hydrALAZINE (APRESOLINE) 100 MG tablet Take 100 mg by mouth 3 (three) times daily.  04/13/16  Yes [provider]  isosorbide mononitrate (IMDUR) 30 MG 24 hr tablet Take 3 tablets (90 mg total) daily by mouth. 04/04/17  Yes Ollis, Brandi L, NP  labetalol (NORMODYNE) 200 MG tablet Take 200 mg by mouth  3 (three) times daily.  03/16/16  Yes [provider]  sevelamer (RENAGEL) 800 MG tablet Take 800-3,200 mg by mouth See admin instructions. 2,400-3,200 mg three times a day with meals and 800 mg with each snack   Yes [provider]  HYDROcodone-acetaminophen (NORCO/VICODIN) 5-325 MG tablet Take 1-2 tablets by mouth every 6 (six) hours as needed. Patient not taking: Reported on 06/10/2017 05/06/17   Horton, Mayer Masker, MD  ondansetron (ZOFRAN ODT) 4 MG disintegrating tablet Take 1 tablet (4 mg total) by mouth every 8 (eight) hours as needed for nausea or vomiting. Patient not taking: Reported on 06/10/2017 05/06/17   Horton, Mayer Masker, MD    Family History Family History  Problem Relation Age of Onset  . Hypertension Mother   . Diabetes Father   . Hypertension Father   . Kidney disease Father   . Hypertension Maternal Grandmother   . Diabetes Paternal Grandmother   . Hypertension Paternal Grandmother     Social History Social History   Tobacco Use  . Smoking status: Never Smoker  . Smokeless tobacco: Never Used  Substance Use Topics  . Alcohol use: No  . Drug use: No     Allergies   Patient has no known allergies.   Review of Systems Review of Systems  Constitutional: Negative for chills and fever.  HENT: Negative for congestion and sore throat.   Eyes: Negative for visual disturbance.  Respiratory: Negative for cough and shortness of breath.   Cardiovascular: Positive for chest pain.  Gastrointestinal: Positive for abdominal pain. Negative for diarrhea, nausea and vomiting.  Genitourinary: Negative for dysuria, flank pain, frequency, hematuria and urgency.  Musculoskeletal: Negative for arthralgias and myalgias.  Skin: Negative for rash.  Neurological: Negative for dizziness, syncope, weakness, light-headedness, numbness and headaches.  Psychiatric/Behavioral: Negative for sleep disturbance. The patient is not nervous/anxious.      Physical  Exam Updated Vital Signs BP (!) 187/140   Pulse 80   Resp 20   Wt 88.5 kg (195 lb 1.7 oz)   SpO2 100%   BMI 26.46 kg/m   Physical Exam  Constitutional: He is oriented to person, place, and time. He appears well-developed and well-nourished.  Non-toxic appearance. No distress.  HENT:  Head: Normocephalic and atraumatic.  Nose: Nose normal.  Mouth/Throat: Oropharynx is clear and moist.  Eyes: Conjunctivae are normal. Pupils are equal, round, and reactive to light. Right eye exhibits no discharge. Left eye exhibits no discharge.  Neck: Normal range of motion. Neck supple. No JVD present. No tracheal deviation present.  Cardiovascular: Normal rate, regular rhythm, normal heart sounds and intact distal pulses. Exam reveals no gallop and no friction rub.  No murmur heard. Pulmonary/Chest: Effort normal and breath sounds normal. No stridor. No respiratory distress. He has no wheezes. He has no rales. He exhibits no tenderness.  No hypoxia or tachypnea.  Abdominal: Soft. Bowel sounds are normal. He exhibits no distension. There is no tenderness. There is no rigidity, no rebound, no guarding, no CVA tenderness, no tenderness at McBurney's point and negative Murphy's  sign.  Musculoskeletal: Normal range of motion. He exhibits no tenderness.  No lower extremity edema or calf tenderness.  Lymphadenopathy:    He has no cervical adenopathy.  Neurological: He is alert and oriented to person, place, and time.  Follow commands appropriately.  Skin: Skin is warm and dry. Capillary refill takes less than 2 seconds. No rash noted. He is not diaphoretic.  Psychiatric: His behavior is normal. Judgment and thought content normal.  Nursing note and vitals reviewed.    ED Treatments / Results  Labs (all labs ordered are listed, but only abnormal results are displayed) Labs Reviewed  I-STAT CHEM 8, ED - Abnormal; Notable for the following components:      Result Value   BUN 64 (*)    Creatinine, Ser  >18.00 (*)    Glucose, Bld 105 (*)    Calcium, Ion 1.08 (*)    Hemoglobin 10.2 (*)    HCT 30.0 (*)    All other components within normal limits  I-STAT TROPONIN, ED    EKG  EKG Interpretation  Date/Time:  Tuesday June 10 2017 09:08:25 EST Ventricular Rate:  73 PR Interval:    QRS Duration: 90 QT Interval:  408 QTC Calculation: 450 R Axis:   85 Text Interpretation:  Sinus rhythm Borderline prolonged PR interval Nonspecific T abnormalities, lateral leads No significant change since last tracing Confirmed by Jacalyn Lefevre [phone]) on 06/10/2017 9:23:45 AM       Radiology No results found.  Procedures Procedures (including critical care time)  Medications Ordered in ED Medications - No data to display   Initial Impression / Assessment and Plan / ED Course  I have reviewed the triage vital signs and the nursing notes.  Pertinent labs & imaging results that were available during my care of the patient were reviewed by me and considered in my medical decision making (see chart for details).     Patient presents to the ED after missing dialysis for his past 2 visits.  States that he came to the ED to get dialysis at the hospital.  Patient reports chronic abdominal pain and some dull chest pain that he states is similar when he needs dialysis.  Denies any associated breath, fevers, chills, headache, cough, nausea, vomiting, diarrhea or urinary symptoms.  Patient is also not taking his blood pressure medicine this morning.  Patient is overall well-appearing and nontoxic.  Vital signs are reassuring.  Patient has hypertension but seems at patient's baseline.  Patient afebrile in the ED.  Lab work.  The patient's baseline.  His potassium is normal.  He has had a creatinine which is similar to prior when he needs dialysis.  Patient reports some chest pain.  EKG shows no ischemic changes and appears at baseline.  Troponin was negative.  Lungs clear to auscultation bilaterally.  No  signs of fluid overload pulmonary congestion.  I doubt hypertensive emergency.  Clinical presentation is very atypical for ACS.  I do not see signs that would require emergent dialysis in the hospital today.  I did speak with patient's outpatient dialysis center who states that they can recommend if we can get him transport to dialysis.  I did speak with social worker who will have a cab voucher for patient.  Patient does have a ride once his dialysis complete.  Patient will be discharged and sent to dialysis at this time.  Pt is hemodynamically stable, in NAD, & able to ambulate in the ED. Evaluation does not show pathology  that would require ongoing emergent intervention or inpatient treatment. I explained the diagnosis to the patient. Pain has been managed & has no complaints prior to dc. Pt is comfortable with above plan and is stable for discharge at this time. All questions were answered prior to disposition. Strict return precautions for f/u to the ED were discussed. Encouraged follow up with PCP.  Pt was discussed with my attending who is agreeable the above plan.    Final Clinical Impressions(s) / ED Diagnoses   Final diagnoses:  Problem with vascular access    ED Discharge Orders    None       Wallace Keller 06/10/17 1631    Jacalyn Lefevre, MD 06/11/17 [phone]

## 2017-06-23 ENCOUNTER — Inpatient Hospital Stay (HOSPITAL_COMMUNITY)
Admission: EM | Admit: 2017-06-23 | Discharge: 2017-06-26 | DRG: 871 | Disposition: A | Discharge status: Home / Self Care | Payer: Medicaid Other | Attending: Family Medicine | Admitting: Family Medicine

## 2017-06-23 ENCOUNTER — Encounter (HOSPITAL_COMMUNITY): Payer: Self-pay

## 2017-06-23 ENCOUNTER — Other Ambulatory Visit: Payer: Self-pay

## 2017-06-23 DIAGNOSIS — D631 Anemia in chronic kidney disease: Secondary | ICD-10-CM | POA: Diagnosis present

## 2017-06-23 DIAGNOSIS — J189 Pneumonia, unspecified organism: Secondary | ICD-10-CM | POA: Diagnosis present

## 2017-06-23 DIAGNOSIS — E875 Hyperkalemia: Secondary | ICD-10-CM

## 2017-06-23 DIAGNOSIS — I2510 Atherosclerotic heart disease of native coronary artery without angina pectoris: Secondary | ICD-10-CM | POA: Diagnosis present

## 2017-06-23 DIAGNOSIS — I160 Hypertensive urgency: Secondary | ICD-10-CM | POA: Diagnosis present

## 2017-06-23 DIAGNOSIS — Z79899 Other long term (current) drug therapy: Secondary | ICD-10-CM

## 2017-06-23 DIAGNOSIS — E8770 Fluid overload, unspecified: Secondary | ICD-10-CM | POA: Diagnosis present

## 2017-06-23 DIAGNOSIS — R109 Unspecified abdominal pain: Secondary | ICD-10-CM

## 2017-06-23 DIAGNOSIS — I120 Hypertensive chronic kidney disease with stage 5 chronic kidney disease or end stage renal disease: Secondary | ICD-10-CM | POA: Diagnosis present

## 2017-06-23 DIAGNOSIS — E8889 Other specified metabolic disorders: Secondary | ICD-10-CM | POA: Diagnosis present

## 2017-06-23 DIAGNOSIS — Z992 Dependence on renal dialysis: Secondary | ICD-10-CM

## 2017-06-23 DIAGNOSIS — I10 Essential (primary) hypertension: Secondary | ICD-10-CM | POA: Diagnosis present

## 2017-06-23 DIAGNOSIS — Y95 Nosocomial condition: Secondary | ICD-10-CM | POA: Diagnosis present

## 2017-06-23 DIAGNOSIS — R112 Nausea with vomiting, unspecified: Secondary | ICD-10-CM | POA: Diagnosis present

## 2017-06-23 DIAGNOSIS — N186 End stage renal disease: Secondary | ICD-10-CM | POA: Diagnosis present

## 2017-06-23 DIAGNOSIS — R402414 Glasgow coma scale score 13-15, 24 hours or more after hospital admission: Secondary | ICD-10-CM | POA: Diagnosis not present

## 2017-06-23 DIAGNOSIS — R197 Diarrhea, unspecified: Secondary | ICD-10-CM | POA: Diagnosis present

## 2017-06-23 DIAGNOSIS — J181 Lobar pneumonia, unspecified organism: Secondary | ICD-10-CM

## 2017-06-23 DIAGNOSIS — A419 Sepsis, unspecified organism: Principal | ICD-10-CM

## 2017-06-23 HISTORY — DX: Pneumonia, unspecified organism: J18.9

## 2017-06-23 NOTE — ED Triage Notes (Signed)
Patient from home with complaints of chest pain, back pain, and abdominal pain.  Patient is on dialysis Tue, thurs, Saturday.  Had double treatments last week on Friday, Saturday. A&Ox4 with ASA given by ems and 1 Nitro.

## 2017-06-24 ENCOUNTER — Emergency Department (HOSPITAL_COMMUNITY): Payer: Medicaid Other

## 2017-06-24 ENCOUNTER — Other Ambulatory Visit: Payer: Self-pay

## 2017-06-24 ENCOUNTER — Encounter (HOSPITAL_COMMUNITY): Payer: Self-pay | Admitting: General Practice

## 2017-06-24 DIAGNOSIS — R402414 Glasgow coma scale score 13-15, 24 hours or more after hospital admission: Secondary | ICD-10-CM | POA: Diagnosis not present

## 2017-06-24 DIAGNOSIS — N186 End stage renal disease: Secondary | ICD-10-CM | POA: Diagnosis present

## 2017-06-24 DIAGNOSIS — Z992 Dependence on renal dialysis: Secondary | ICD-10-CM | POA: Diagnosis not present

## 2017-06-24 DIAGNOSIS — A419 Sepsis, unspecified organism: Secondary | ICD-10-CM | POA: Diagnosis present

## 2017-06-24 DIAGNOSIS — I120 Hypertensive chronic kidney disease with stage 5 chronic kidney disease or end stage renal disease: Secondary | ICD-10-CM | POA: Diagnosis present

## 2017-06-24 DIAGNOSIS — I2510 Atherosclerotic heart disease of native coronary artery without angina pectoris: Secondary | ICD-10-CM | POA: Diagnosis present

## 2017-06-24 DIAGNOSIS — Y95 Nosocomial condition: Secondary | ICD-10-CM | POA: Diagnosis present

## 2017-06-24 DIAGNOSIS — R109 Unspecified abdominal pain: Secondary | ICD-10-CM | POA: Diagnosis present

## 2017-06-24 DIAGNOSIS — J189 Pneumonia, unspecified organism: Secondary | ICD-10-CM

## 2017-06-24 DIAGNOSIS — E8889 Other specified metabolic disorders: Secondary | ICD-10-CM | POA: Diagnosis present

## 2017-06-24 DIAGNOSIS — D631 Anemia in chronic kidney disease: Secondary | ICD-10-CM | POA: Diagnosis present

## 2017-06-24 DIAGNOSIS — Z79899 Other long term (current) drug therapy: Secondary | ICD-10-CM | POA: Diagnosis not present

## 2017-06-24 DIAGNOSIS — E8770 Fluid overload, unspecified: Secondary | ICD-10-CM | POA: Diagnosis present

## 2017-06-24 DIAGNOSIS — R112 Nausea with vomiting, unspecified: Secondary | ICD-10-CM | POA: Diagnosis present

## 2017-06-24 DIAGNOSIS — I160 Hypertensive urgency: Secondary | ICD-10-CM | POA: Diagnosis present

## 2017-06-24 DIAGNOSIS — E875 Hyperkalemia: Secondary | ICD-10-CM | POA: Diagnosis present

## 2017-06-24 HISTORY — DX: Pneumonia, unspecified organism: J18.9

## 2017-06-24 LAB — RENAL FUNCTION PANEL
Albumin: 3.7 g/dL (ref 3.5–5.0)
Anion gap: 18 — ABNORMAL HIGH (ref 5–15)
BUN: 34 mg/dL — ABNORMAL HIGH (ref 6–20)
CO2: 25 mmol/L (ref 22–32)
Calcium: 9 mg/dL (ref 8.9–10.3)
Chloride: 98 mmol/L — ABNORMAL LOW (ref 101–111)
Creatinine, Ser: 12.42 mg/dL — ABNORMAL HIGH (ref 0.61–1.24)
GFR calc Af Amer: 6 mL/min — ABNORMAL LOW (ref >60)
GFR calc non Af Amer: 5 mL/min — ABNORMAL LOW (ref >60)
Glucose, Bld: 98 mg/dL (ref 65–99)
Phosphorus: 4.5 mg/dL (ref 2.5–4.6)
Potassium: 4.1 mmol/L (ref 3.5–5.1)
Sodium: 141 mmol/L (ref 135–145)

## 2017-06-24 LAB — RESPIRATORY PANEL BY PCR
ADENOVIRUS-RVPPCR: NOT DETECTED
BORDETELLA PERTUSSIS-RVPCR: NOT DETECTED
CHLAMYDOPHILA PNEUMONIAE-RVPPCR: NOT DETECTED
CORONAVIRUS 229E-RVPPCR: NOT DETECTED
CORONAVIRUS HKU1-RVPPCR: NOT DETECTED
CORONAVIRUS NL63-RVPPCR: NOT DETECTED
Coronavirus OC43: NOT DETECTED
Influenza A: NOT DETECTED
Influenza B: NOT DETECTED
Metapneumovirus: NOT DETECTED
Mycoplasma pneumoniae: NOT DETECTED
PARAINFLUENZA VIRUS 2-RVPPCR: NOT DETECTED
Parainfluenza Virus 1: NOT DETECTED
Parainfluenza Virus 3: NOT DETECTED
Parainfluenza Virus 4: NOT DETECTED
Respiratory Syncytial Virus: NOT DETECTED
Rhinovirus / Enterovirus: NOT DETECTED

## 2017-06-24 LAB — PROTIME-INR
INR: 1.23
Prothrombin Time: 15.4 seconds — ABNORMAL HIGH (ref 11.4–15.2)

## 2017-06-24 LAB — CBC
HCT: 33.2 % — ABNORMAL LOW (ref 39.0–52.0)
HCT: 32.2 % — ABNORMAL LOW (ref 39.0–52.0)
HEMOGLOBIN: 10.6 g/dL — AB (ref 13.0–17.0)
Hemoglobin: 10.2 g/dL — ABNORMAL LOW (ref 13.0–17.0)
MCH: 29.3 pg (ref 26.0–34.0)
MCH: 29 pg (ref 26.0–34.0)
MCHC: 31.9 g/dL (ref 30.0–36.0)
MCHC: 31.7 g/dL (ref 30.0–36.0)
MCV: 91.7 fL (ref 78.0–100.0)
MCV: 91.5 fL (ref 78.0–100.0)
Platelets: 232 10*3/uL (ref 150–400)
Platelets: 210 10*3/uL (ref 150–400)
RBC: 3.62 MIL/uL — ABNORMAL LOW (ref 4.22–5.81)
RBC: 3.52 MIL/uL — ABNORMAL LOW (ref 4.22–5.81)
RDW: 17.4 % — ABNORMAL HIGH (ref 11.5–15.5)
RDW: 17.5 % — ABNORMAL HIGH (ref 11.5–15.5)
WBC: 11.9 10*3/uL — ABNORMAL HIGH (ref 4.0–10.5)
WBC: 12.3 10*3/uL — ABNORMAL HIGH (ref 4.0–10.5)

## 2017-06-24 LAB — INFLUENZA PANEL BY PCR (TYPE A & B)
Influenza A By PCR: NEGATIVE
Influenza B By PCR: NEGATIVE

## 2017-06-24 LAB — COMPREHENSIVE METABOLIC PANEL
ALT: 17 U/L (ref 17–63)
AST: 17 U/L (ref 15–41)
Albumin: 4.1 g/dL (ref 3.5–5.0)
Alkaline Phosphatase: 55 U/L (ref 38–126)
Anion gap: 21 — ABNORMAL HIGH (ref 5–15)
BUN: 75 mg/dL — AB (ref 6–20)
CALCIUM: 9.2 mg/dL (ref 8.9–10.3)
CHLORIDE: 100 mmol/L — AB (ref 101–111)
CO2: 18 mmol/L — AB (ref 22–32)
CREATININE: 22.68 mg/dL — AB (ref 0.61–1.24)
GFR calc Af Amer: 3 mL/min — ABNORMAL LOW (ref >60)
GFR, EST NON AFRICAN AMERICAN: 2 mL/min — AB (ref >60)
Glucose, Bld: 76 mg/dL (ref 65–99)
Potassium: >7.5 mmol/L (ref 3.5–5.1)
Sodium: 139 mmol/L (ref 135–145)
Total Bilirubin: 0.9 mg/dL (ref 0.3–1.2)
Total Protein: 7.4 g/dL (ref 6.5–8.1)

## 2017-06-24 LAB — PROCALCITONIN: Procalcitonin: 33.9 ng/mL

## 2017-06-24 LAB — CREATININE, SERUM
Creatinine, Ser: 12.66 mg/dL — ABNORMAL HIGH (ref 0.61–1.24)
GFR calc Af Amer: 6 mL/min — ABNORMAL LOW (ref >60)
GFR, EST NON AFRICAN AMERICAN: 5 mL/min — AB (ref >60)

## 2017-06-24 LAB — MRSA PCR SCREENING: MRSA BY PCR: NEGATIVE

## 2017-06-24 LAB — POTASSIUM: POTASSIUM: 4.9 mmol/L (ref 3.5–5.1)

## 2017-06-24 LAB — I-STAT TROPONIN, ED: Troponin i, poc: 0.07 ng/mL (ref 0.00–0.08)

## 2017-06-24 LAB — APTT: APTT: 33 s (ref 24–36)

## 2017-06-24 LAB — LACTIC ACID, PLASMA: LACTIC ACID, VENOUS: 0.9 mmol/L (ref 0.5–1.9)

## 2017-06-24 MED ORDER — SODIUM CHLORIDE 0.9 % IV SOLN: 100.0000 mL | INTRAVENOUS | Status: DC | PRN

## 2017-06-24 MED ORDER — ACETAMINOPHEN 325 MG PO TABS
650.0000 mg | ORAL_TABLET | Freq: Four times a day (QID) | ORAL | Status: DC | PRN
  Administered 2017-06-24: 650 mg via ORAL

## 2017-06-24 MED ORDER — SODIUM CHLORIDE 0.9 % IV SOLN
1.0000 g | Freq: Once | INTRAVENOUS | Status: AC
  Administered 2017-06-24: 1 g via INTRAVENOUS
  Filled 2017-06-24: qty 10

## 2017-06-24 MED ORDER — VANCOMYCIN HCL 10 G IV SOLR
2000.0000 mg | Freq: Once | INTRAVENOUS | Status: AC
  Administered 2017-06-24: 2000 mg via INTRAVENOUS
  Filled 2017-06-24: qty 2000

## 2017-06-24 MED ORDER — PENTAFLUOROPROP-TETRAFLUOROETH EX AERO: 1.0000 "application " | INHALATION_SPRAY | CUTANEOUS | Status: DC | PRN

## 2017-06-24 MED ORDER — HYDRALAZINE HCL 20 MG/ML IJ SOLN
INTRAMUSCULAR | Status: AC
  Administered 2017-06-24: 20 mg via INTRAVENOUS
  Filled 2017-06-24: qty 1

## 2017-06-24 MED ORDER — ACETAMINOPHEN 325 MG PO TABS
650.0000 mg | ORAL_TABLET | Freq: Once | ORAL | Status: AC
  Administered 2017-06-24: 650 mg via ORAL
  Filled 2017-06-24: qty 2

## 2017-06-24 MED ORDER — LIDOCAINE-PRILOCAINE 2.5-2.5 % EX CREA: 1.0000 "application " | TOPICAL_CREAM | CUTANEOUS | Status: DC | PRN

## 2017-06-24 MED ORDER — HEPARIN SODIUM (PORCINE) 1000 UNIT/ML DIALYSIS: 1000.0000 [IU] | INTRAMUSCULAR | Status: DC | PRN

## 2017-06-24 MED ORDER — DEXTROSE 5 % IV SOLN
500.0000 mg | Freq: Once | INTRAVENOUS | Status: DC
  Administered 2017-06-24: 500 mg via INTRAVENOUS

## 2017-06-24 MED ORDER — HYDRALAZINE HCL 20 MG/ML IJ SOLN
20.0000 mg | INTRAMUSCULAR | Status: DC | PRN
  Administered 2017-06-24: 20 mg via INTRAVENOUS

## 2017-06-24 MED ORDER — ALTEPLASE 2 MG IJ SOLR
2.0000 mg | Freq: Once | INTRAMUSCULAR | Status: DC | PRN
  Filled 2017-06-24: qty 2

## 2017-06-24 MED ORDER — CLONIDINE HCL 0.1 MG PO TABS
0.2000 mg | ORAL_TABLET | Freq: Three times a day (TID) | ORAL | Status: DC
  Administered 2017-06-24 – 2017-06-26 (×7): 0.2 mg via ORAL
  Filled 2017-06-24 (×9): qty 2

## 2017-06-24 MED ORDER — ONDANSETRON HCL 4 MG PO TABS: 4.0000 mg | ORAL_TABLET | Freq: Four times a day (QID) | ORAL | Status: DC | PRN

## 2017-06-24 MED ORDER — CALCIUM GLUCONATE 10 % IV SOLN
INTRAVENOUS | Status: AC
  Filled 2017-06-24: qty 10

## 2017-06-24 MED ORDER — HYDRALAZINE HCL 100 MG PO TABS: 100.0000 mg | ORAL_TABLET | Freq: Three times a day (TID) | ORAL | Status: DC

## 2017-06-24 MED ORDER — LIDOCAINE HCL (PF) 1 % IJ SOLN: 5.0000 mL | INTRAMUSCULAR | Status: DC | PRN

## 2017-06-24 MED ORDER — PENTAFLUOROPROP-TETRAFLUOROETH EX AERO
1.0000 "application " | INHALATION_SPRAY | CUTANEOUS | Status: DC | PRN
  Filled 2017-06-24: qty 30

## 2017-06-24 MED ORDER — ACETAMINOPHEN 650 MG RE SUPP: 650.0000 mg | Freq: Four times a day (QID) | RECTAL | Status: DC | PRN

## 2017-06-24 MED ORDER — HEPARIN SODIUM (PORCINE) 1000 UNIT/ML DIALYSIS
20.0000 [IU]/kg | INTRAMUSCULAR | Status: DC | PRN
  Filled 2017-06-24: qty 2

## 2017-06-24 MED ORDER — ISOSORBIDE MONONITRATE ER 60 MG PO TB24
90.0000 mg | ORAL_TABLET | Freq: Every day | ORAL | Status: DC
  Administered 2017-06-24 – 2017-06-26 (×3): 90 mg via ORAL
  Filled 2017-06-24 (×3): qty 1

## 2017-06-24 MED ORDER — ALBUTEROL SULFATE (2.5 MG/3ML) 0.083% IN NEBU
10.0000 mg | INHALATION_SOLUTION | Freq: Once | RESPIRATORY_TRACT | Status: AC
  Administered 2017-06-24: 10 mg via RESPIRATORY_TRACT
  Filled 2017-06-24: qty 12

## 2017-06-24 MED ORDER — INSULIN ASPART 100 UNIT/ML ~~LOC~~ SOLN
10.0000 [IU] | Freq: Once | SUBCUTANEOUS | Status: AC
  Administered 2017-06-24: 10 [IU] via INTRAVENOUS
  Filled 2017-06-24: qty 1

## 2017-06-24 MED ORDER — HYDROCODONE-ACETAMINOPHEN 5-325 MG PO TABS
1.0000 | ORAL_TABLET | ORAL | Status: DC | PRN
  Administered 2017-06-24 – 2017-06-26 (×5): 2 via ORAL
  Filled 2017-06-24 (×5): qty 2

## 2017-06-24 MED ORDER — LABETALOL HCL 200 MG PO TABS
200.0000 mg | ORAL_TABLET | Freq: Three times a day (TID) | ORAL | Status: DC
  Administered 2017-06-24 – 2017-06-26 (×7): 200 mg via ORAL
  Filled 2017-06-24 (×7): qty 1

## 2017-06-24 MED ORDER — DEXTROSE 5 % IV SOLN
1.0000 g | Freq: Once | INTRAVENOUS | Status: AC
  Administered 2017-06-24: 1 g via INTRAVENOUS
  Filled 2017-06-24: qty 10

## 2017-06-24 MED ORDER — DEXTROSE 5 % IV SOLN: 500.0000 mg | Freq: Two times a day (BID) | INTRAVENOUS | Status: DC

## 2017-06-24 MED ORDER — IBUPROFEN 200 MG PO TABS
600.0000 mg | ORAL_TABLET | Freq: Once | ORAL | Status: AC
  Administered 2017-06-24: 600 mg via ORAL
  Filled 2017-06-24: qty 3

## 2017-06-24 MED ORDER — CINACALCET HCL 30 MG PO TABS
60.0000 mg | ORAL_TABLET | Freq: Every day | ORAL | Status: DC
  Administered 2017-06-24 – 2017-06-26 (×3): 60 mg via ORAL
  Filled 2017-06-24 (×3): qty 2

## 2017-06-24 MED ORDER — ALTEPLASE 2 MG IJ SOLR: 2.0000 mg | Freq: Once | INTRAMUSCULAR | Status: DC | PRN

## 2017-06-24 MED ORDER — SODIUM BICARBONATE 8.4 % IV SOLN
50.0000 meq | Freq: Once | INTRAVENOUS | Status: AC
  Administered 2017-06-24: 50 meq via INTRAVENOUS
  Filled 2017-06-24: qty 50

## 2017-06-24 MED ORDER — DEXTROSE 5 % IV SOLN
500.0000 mg | Freq: Once | INTRAVENOUS | Status: DC
  Filled 2017-06-24: qty 500

## 2017-06-24 MED ORDER — DEXTROSE 5 % IV SOLN
1.0000 g | INTRAVENOUS | Status: DC
  Administered 2017-06-24 – 2017-06-26 (×3): 1 g via INTRAVENOUS
  Filled 2017-06-24 (×5): qty 1

## 2017-06-24 MED ORDER — LIDOCAINE-PRILOCAINE 2.5-2.5 % EX CREA
1.0000 "application " | TOPICAL_CREAM | CUTANEOUS | Status: DC | PRN
  Filled 2017-06-24: qty 5

## 2017-06-24 MED ORDER — SEVELAMER CARBONATE 800 MG PO TABS
800.0000 mg | ORAL_TABLET | Freq: Three times a day (TID) | ORAL | Status: DC
  Administered 2017-06-24 – 2017-06-26 (×6): 800 mg via ORAL
  Filled 2017-06-24 (×6): qty 1

## 2017-06-24 MED ORDER — ONDANSETRON 4 MG PO TBDP: 4.0000 mg | ORAL_TABLET | Freq: Three times a day (TID) | ORAL | Status: DC | PRN

## 2017-06-24 MED ORDER — DEXTROSE 5 % IV SOLN: 500.0000 mg | Freq: Once | INTRAVENOUS | Status: DC

## 2017-06-24 MED ORDER — HYDRALAZINE HCL 50 MG PO TABS
100.0000 mg | ORAL_TABLET | Freq: Three times a day (TID) | ORAL | Status: DC
  Administered 2017-06-24 – 2017-06-26 (×7): 100 mg via ORAL
  Filled 2017-06-24 (×7): qty 2

## 2017-06-24 MED ORDER — ACETAMINOPHEN 325 MG PO TABS
ORAL_TABLET | ORAL | Status: AC
  Administered 2017-06-24: 650 mg via ORAL
  Filled 2017-06-24: qty 2

## 2017-06-24 MED ORDER — GUAIFENESIN ER 600 MG PO TB12: 1200.0000 mg | ORAL_TABLET | Freq: Two times a day (BID) | ORAL | Status: DC | PRN

## 2017-06-24 MED ORDER — HEPARIN SODIUM (PORCINE) 1000 UNIT/ML DIALYSIS: 20.0000 [IU]/kg | INTRAMUSCULAR | Status: DC | PRN

## 2017-06-24 MED ORDER — INSULIN ASPART 100 UNIT/ML IV SOLN: 10.0000 [IU] | Freq: Once | INTRAVENOUS | Status: DC

## 2017-06-24 MED ORDER — ONDANSETRON HCL 4 MG/2ML IJ SOLN
4.0000 mg | Freq: Four times a day (QID) | INTRAMUSCULAR | Status: DC | PRN
  Administered 2017-06-25: 4 mg via INTRAVENOUS
  Filled 2017-06-24: qty 2

## 2017-06-24 MED ORDER — HEPARIN SODIUM (PORCINE) 1000 UNIT/ML DIALYSIS
1000.0000 [IU] | INTRAMUSCULAR | Status: DC | PRN
  Filled 2017-06-24: qty 1

## 2017-06-24 MED ORDER — AMLODIPINE BESYLATE 10 MG PO TABS
10.0000 mg | ORAL_TABLET | Freq: Every day | ORAL | Status: DC
  Administered 2017-06-24 – 2017-06-26 (×3): 10 mg via ORAL
  Filled 2017-06-24 (×3): qty 1

## 2017-06-24 MED ORDER — SODIUM CHLORIDE 0.9 % IV SOLN
INTRAVENOUS | Status: DC
Start: 2017-06-24 — End: 2017-06-24

## 2017-06-24 MED ORDER — DEXTROSE 50 % IV SOLN
1.0000 | Freq: Once | INTRAVENOUS | Status: AC
  Administered 2017-06-24: 50 mL via INTRAVENOUS
  Filled 2017-06-24: qty 50

## 2017-06-24 MED ORDER — ONDANSETRON HCL 4 MG/2ML IJ SOLN
4.0000 mg | Freq: Once | INTRAMUSCULAR | Status: AC
  Administered 2017-06-24: 4 mg via INTRAVENOUS
  Filled 2017-06-24: qty 2

## 2017-06-24 MED ORDER — HEPARIN SODIUM (PORCINE) 1000 UNIT/ML DIALYSIS: 4600.0000 [IU] | Freq: Once | INTRAMUSCULAR | Status: DC

## 2017-06-24 MED ORDER — HEPARIN SODIUM (PORCINE) 5000 UNIT/ML IJ SOLN
5000.0000 [IU] | Freq: Three times a day (TID) | INTRAMUSCULAR | Status: DC
  Administered 2017-06-25 – 2017-06-26 (×3): 5000 [IU] via SUBCUTANEOUS
  Filled 2017-06-24 (×4): qty 1

## 2017-06-24 NOTE — Progress Notes (Signed)
Stat lab was sent @ [phone] not currently showing active in progress. Notified lab regarding status they  requested we call  facilities to track lab. Facilities states lab was sent from tube station 84 to the lab at the time. Awaiting response form Kim in lab regarding  Status.

## 2017-06-24 NOTE — Progress Notes (Signed)
Pharmacy Antibiotic Note  Jeremy Huerta is a 22 y.o. male admitted on 06/23/2017 with pneumonia.  ESRD patient - to finish HD today in about 15 min  Plan: Cefepime 1 g q24h - convert to 2 g q24h if goes on regular schedule for HD Vanc 2 g x 1 - HD complete for this am Monitor HD schedule cx vanc lvls prn  Weight: 205 lb 4 oz (93.1 kg)  Temp (24hrs), Avg:101.1 F (38.4 C), Min:99.6 F (37.6 C), Max:102.3 F (39.1 C)  Recent Labs  Lab 06/24/17 0130  WBC 11.9*  CREATININE 22.68*    Estimated Creatinine Clearance: 5.6 mL/min (A) (by C-G formula based on SCr of 22.68 mg/dL (H)).    No Known Allergies  Jeremy Huerta, PharmD, BCPS, BCCCP Clinical Pharmacist Clinical phone for 06/24/2017 from 7a-3:30p: [phone] If after 3:30p, please call main pharmacy at: [phone] 06/24/2017 9:57 AM

## 2017-06-24 NOTE — ED Provider Notes (Signed)
[facility] EMERGENCY DEPARTMENT Provider Note   CSN: [id number] Arrival date & time: 06/23/17  2349     History   Chief Complaint Chief Complaint  Patient presents with  . Chest Pain    HPI Jeremy Huerta is a 22 y.o. male w PMHx ESRD on HD TuThSa, HTN, CAD with acute onset of productive cough and chest tightness that began today. Pt states cough is productive of yellow sputum. Assoc nasal congestion, generalized myalgias, abdominal pain and nausea. No sick contacts, does not think he had influenza vaccine this year. Denies vomiting, diarrhea, urinary sx, HA, sore throat, SOB, or other complaints. Last received dialysis on Saturday.   The history is provided by the patient.    Past Medical History:  Diagnosis Date  . Acute respiratory failure (HCC)   . ESRD (end stage renal disease) (HCC)    TTS  . FSGS (focal segmental glomerulosclerosis)   . Hypertension   . Nausea & vomiting 11/2016    Patient Active Problem List   Diagnosis Date Noted  . Vomiting 04/29/2017  . Skin abscess 03/07/2017  . 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 Back pain 03/05/2017  . Hypertensive emergency 01/13/2017  . History of anemia due to chronic kidney disease 01/13/2017  . Secondary hyperparathyroidism (HCC) 01/13/2017  . Bleeding from the nose 01/13/2017  . Fever 01/13/2017  . Chest pain on breathing   . SOB (shortness of breath)   . Gastroesophageal reflux disease   . Respiratory failure (HCC) 12/24/2016  . Acute pulmonary edema (HCC)   . End-stage renal disease on hemodialysis (HCC)   . Hypertensive urgency 12/02/2016  . Leukocytosis 12/02/2016  . Nausea & vomiting 12/02/2016  . Hyperkalemia 12/02/2016  . Acute respiratory failure (HCC) 04/22/2016  . Diarrhea 04/22/2016  . HCAP (healthcare-associated pneumonia) 04/22/2016  . Tachycardia 04/22/2016  . Hypertension     Past Surgical History:  Procedure Laterality Date  . AV FISTULA PLACEMENT  12/2015  .  RENAL BIOPSY  2013       Home Medications    Prior to Admission medications   Medication Sig Start Date End Date Taking? Authorizing Provider  amLODipine (NORVASC) 10 MG tablet Take 1 tablet (10 mg total) daily by mouth. 04/04/17   Ollis, Luetta Nutting, NP  B Complex-C-Folic Acid (B COMPLEX-VITAMIN C-FOLIC ACID) 1 MG tablet Take 1 tablet by mouth daily. 04/30/16   [provider]  cinacalcet (SENSIPAR) 30 MG tablet Take 2 tablets (60 mg total) by mouth daily with supper. Patient taking differently: Take 60 mg by mouth daily.  12/04/16   Rolly Salter, MD  cloNIDine (CATAPRES) 0.2 MG tablet Take 1 tablet (0.2 mg total) by mouth 3 (three) times daily. 04/30/17   Scherrie Gerlach, MD  fluticasone (FLONASE) 50 MCG/ACT nasal spray Place 1 spray daily into both nostrils. Patient taking differently: Place 1 spray into both nostrils daily as needed for allergies or rhinitis.  04/05/17   Jeanella Craze, NP  hydrALAZINE (APRESOLINE) 100 MG tablet Take 100 mg by mouth 3 (three) times daily.  04/13/16   [provider]  HYDROcodone-acetaminophen (NORCO/VICODIN) 5-325 MG tablet Take 1-2 tablets by mouth every 6 (six) hours as needed. Patient not taking: Reported on 06/10/2017 05/06/17   Horton, Mayer Masker, MD  isosorbide mononitrate (IMDUR) 30 MG 24 hr tablet Take 3 tablets (90 mg total) daily by mouth. 04/04/17   Jeanella Craze, NP  labetalol (NORMODYNE) 200 MG tablet Take 200 mg by  mouth 3 (three) times daily.  03/16/16   [provider]  ondansetron (ZOFRAN ODT) 4 MG disintegrating tablet Take 1 tablet (4 mg total) by mouth every 8 (eight) hours as needed for nausea or vomiting. Patient not taking: Reported on 06/10/2017 05/06/17   Horton, Mayer Maskerourtney F, MD  sevelamer (RENAGEL) 800 MG tablet Take 800-3,200 mg by mouth See admin instructions. 2,400-3,200 mg three times a day with meals and 800 mg with each snack    [provider]    Family History Family History    Problem Relation Age of Onset  . Hypertension Mother   . Diabetes Father   . Hypertension Father   . Kidney disease Father   . Hypertension Maternal Grandmother   . Diabetes Paternal Grandmother   . Hypertension Paternal Grandmother     Social History Social History   Tobacco Use  . Smoking status: Never Smoker  . Smokeless tobacco: Never Used  Substance Use Topics  . Alcohol use: No  . Drug use: No     Allergies   Patient has no known allergies.   Review of Systems Review of Systems  HENT: Positive for congestion. Negative for sore throat, trouble swallowing and voice change.   Respiratory: Positive for cough and chest tightness. Negative for shortness of breath.   Gastrointestinal: Positive for abdominal pain and nausea. Negative for constipation, diarrhea and vomiting.  Genitourinary: Negative for dysuria and frequency.  Musculoskeletal: Positive for myalgias (generalized).  All other systems reviewed and are negative.    Physical Exam Updated Vital Signs BP (!) 188/99   Pulse (!) 112   Temp (!) 101.4 F (38.6 C) (Oral)   Resp (!) 26   SpO2 92%   Physical Exam  Constitutional: He appears well-developed and well-nourished. No distress.  Appears uncomfortable  HENT:  Head: Normocephalic and atraumatic.  Mouth/Throat: Oropharynx is clear and moist.  Eyes: Conjunctivae and EOM are normal. Pupils are equal, round, and reactive to light.  Neck: Normal range of motion. Neck supple.  Cardiovascular: Normal rate, regular rhythm, normal heart sounds and intact distal pulses.  Pulmonary/Chest: Effort normal. No respiratory distress. He has wheezes. He exhibits no tenderness.  Wheezes and rhonchi bilaterally with dec lung sounds right lower lobe  Abdominal: Soft. Bowel sounds are normal. He exhibits no distension and no mass. There is tenderness (generalized). There is no rebound and no guarding.  Neurological: He is alert.  Skin: Skin is warm.  Psychiatric: He  has a normal mood and affect. His behavior is normal.  Nursing note and vitals reviewed.    ED Treatments / Results  Labs (all labs ordered are listed, but only abnormal results are displayed) Labs Reviewed  CBC - Abnormal; Notable for the following components:      Result Value   WBC 11.9 (*)    RBC 3.62 (*)    Hemoglobin 10.6 (*)    HCT 33.2 (*)    RDW 17.4 (*)    All other components within normal limits  COMPREHENSIVE METABOLIC PANEL - Abnormal; Notable for the following components:   Potassium >7.5 (*)    Chloride 100 (*)    CO2 18 (*)    BUN 75 (*)    Creatinine, Ser 22.68 (*)    GFR calc non Af Amer 2 (*)    GFR calc Af Amer 3 (*)    Anion gap 21 (*)    All other components within normal limits  I-STAT TROPONIN, ED  EKG  EKG Interpretation  Date/Time:  Tuesday June 24 2017 00:00:53 EST Ventricular Rate:  101 PR Interval:  190 QRS Duration: 86 QT Interval:  334 QTC Calculation: 433 R Axis:   105 Text Interpretation:  Sinus tachycardia Rightward axis Borderline ECG Nonspecific T wave abnormality When compared with ECG of 06/10/2017, No significant change was found Confirmed by Dione Booze ([id number]) on 06/24/2017 12:14:01 AM       Radiology Dg Chest 2 View  Result Date: 06/24/2017 CLINICAL DATA:  Chest and back pain, abdominal pain. History of end-stage renal disease on dialysis. EXAM: CHEST  2 VIEW COMPARISON:  Chest radiograph May 06, 2017 FINDINGS: Cardiac silhouette is mildly enlarged. Pulmonary vascular congestion, prominent bronchovascular markings with patchy RIGHT lung base airspace opacity. Slight blunting of the costophrenic angles without frank pleural effusion. No pneumothorax. Soft tissue planes and included osseous structures are nonacute. Potential chronic deformity of the RIGHT scapula versus projectional artifact. IMPRESSION: Mild cardiomegaly. Prominent bronchovascular structures seen with pulmonary edema or atypical infection with confluent  edema versus pneumonia RIGHT lung base. Electronically Signed   By: Awilda Metro M.D.   On: 06/24/2017 00:51    Procedures Procedures (including critical care time)  Medications Ordered in ED Medications  calcium chloride 1 g in sodium chloride 0.9 % 100 mL IVPB (1 g Intravenous New Bag/Given 06/24/17 0518)  azithromycin (ZITHROMAX) 500 mg in dextrose 5 % 250 mL IVPB (not administered)  ondansetron (ZOFRAN) injection 4 mg (4 mg Intravenous Given 06/24/17 0221)  acetaminophen (TYLENOL) tablet 650 mg (650 mg Oral Given 06/24/17 0221)  albuterol (PROVENTIL) (2.5 MG/3ML) 0.083% nebulizer solution 10 mg (10 mg Nebulization Given 06/24/17 0350)  dextrose 50 % solution 50 mL (50 mLs Intravenous Given 06/24/17 0349)  sodium bicarbonate injection 50 mEq (50 mEq Intravenous Given 06/24/17 0350)  cefTRIAXone (ROCEPHIN) 1 g in dextrose 5 % 50 mL IVPB (0 g Intravenous Stopped 06/24/17 0420)  insulin aspart (novoLOG) injection 10 Units (10 Units Intravenous Given 06/24/17 0350)     Initial Impression / Assessment and Plan / ED Course  I have reviewed the triage vital signs and the nursing notes.  Pertinent labs & imaging results that were available during my care of the patient were reviewed by me and considered in my medical decision making (see chart for details).    Pt on HD TuThSa, presenting to ED for acute onset of cough, congestion, chest tightness, and generalized myalgias since Monday morning. Pt febrile on arrival. CXR consistent with CAP. CBC with mild leukocytosis. Trop elev at 0.07, however per chart review it appears pt has chronically elevated troponin. CMP revealing critical hyperkalemia of 7.5. Dr. Preston Fleeting evaluated patient, and ordered bicarb, insulin, and d50, Azithromycin and rocephin for PNA. Nephrology consulted by Dr. Preston Fleeting for hemodialysis. Pt will go to emergent dialysis today, and nephrology recommending medical admission for pneumonia. Dr. Toniann Fail accepting admission.  The patient  appears reasonably stabilized for admission considering the current resources, flow, and capabilities available in the ED at this time, and I doubt any other [facility] requiring further screening and/or treatment in the ED prior to admission.  Final Clinical Impressions(s) / ED Diagnoses   Final diagnoses:  Hyperkalemia  Community acquired pneumonia of right lower lobe of lung [facility])    ED Discharge Orders    None       Lashawn Bromwell, [place] N, PA-C 06/24/17 0530    Dione Booze, MD 06/24/17 [phone]

## 2017-06-24 NOTE — Procedures (Signed)
Patient was seen on dialysis and the procedure was supervised.  BFR 400  Via AVF BP is  high.   Patient appears to be tolerating treatment well- emergent HD for high K  Jeremy Huerta A 06/24/2017

## 2017-06-24 NOTE — ED Notes (Signed)
Patient transported to X-ray 

## 2017-06-24 NOTE — Consult Note (Signed)
Reason for Consult: To manage dialysis and dialysis related needs Referring Physician: Drevion Huerta is an 22 y.o. male with ESRD due to FSGS, HTN and recurrent abdominal pain, mult hosp- roughly monthly since July of 2018, last in mid December.  He reportedly shortens many HD treatments.  He was noted to go to HD last week TTF due to going out of town. He presented to the ER tonight with c/o productive cough, myalgias.  He is noted to be febrile, hypertensive and labs show K of greater than 7.5, WBC 11.9.  I was called to administer emergent HD due to high K.  CXR shows PNA vs edema   Dialysis Orders: Unit:[facility] KGY18.5UD K/Ca:2/2.25 Access:LUE AVF 15g 450/800 UF Proflie:2 VDRA:hectorol 73mg qTx no ESA or Fe heparin 4600 Treatment Adherence:freq s/o early    Past Medical History:  Diagnosis Date  . [place]   . ESRD (end stage renal disease) (HCC)    TTS  . FSGS (focal segmental glomerulosclerosis)   . Hypertension   . Nausea & vomiting 11/2016    Past Surgical History:  Procedure Laterality Date  . AV FISTULA PLACEMENT  12/2015  . RENAL BIOPSY  2013    Family History  Problem Relation Age of Onset  . Hypertension Mother   . Diabetes Father   . Hypertension Father   . Kidney disease Father   . Hypertension Maternal Grandmother   . Diabetes Paternal Grandmother   . Hypertension Paternal Grandmother     Social History:  reports that  has never smoked. he has never used smokeless tobacco. He reports that he does not drink alcohol or use drugs.  Allergies: No Known Allergies  Medications: I have reviewed the patient's current medications.   Results for orders placed or performed during the hospital encounter of 06/23/17 (from the past 48 hour(s))  CBC     Status: Abnormal   Collection Time: 06/24/17  1:30 AM  Result Value Ref Range   WBC 11.9 (H) 4.0 - 10.5 K/uL   RBC 3.62 (L) 4.22 - 5.81  MIL/uL   Hemoglobin 10.6 (L) 13.0 - 17.0 g/dL   HCT 33.2 (L) 39.0 - 52.0 %   MCV 91.7 78.0 - 100.0 fL   MCH 29.3 26.0 - 34.0 pg   MCHC 31.9 30.0 - 36.0 g/dL   RDW 17.4 (H) 11.5 - 15.5 %   Platelets 232 150 - 400 K/uL    Comment: Performed at [facility] [id number] [place] [id number] Comprehensive metabolic panel     Status: Abnormal   Collection Time: 06/24/17  1:30 AM  Result Value Ref Range   Sodium 139 135 - 145 mmol/L   Potassium >7.5 (HH) 3.5 - 5.1 mmol/L    Comment: NO VISIBLE HEMOLYSIS CRITICAL RESULT CALLED TO, READ BACK BY AND VERIFIED WITH: [facility] 0330 WAYK    Chloride 100 (L) 101 - 111 mmol/L   CO2 18 (L) 22 - 32 mmol/L   Glucose, Bld 76 65 - 99 mg/dL   BUN 75 (H) 6 - 20 mg/dL   Creatinine, Ser 22.68 (H) 0.61 - 1.24 mg/dL   Calcium 9.2 8.9 - 10.3 mg/dL   Total Protein 7.4 6.5 - 8.1 g/dL   Albumin 4.1 3.5 - 5.0 g/dL   AST 17 15 - 41 U/L   ALT 17 17 - 63 U/L   Alkaline Phosphatase 55 38 - 126 U/L  Total Bilirubin 0.9 0.3 - 1.2 mg/dL   GFR calc non Af Amer 2 (L) >60 mL/min   GFR calc Af Amer 3 (L) >60 mL/min    Comment: (NOTE) The eGFR has been calculated using the CKD EPI equation. This calculation has not been validated in all clinical situations. eGFR's persistently <60 mL/min signify possible Chronic Kidney Disease.    Anion gap 21 (H) 5 - 15    Comment: Performed at [facility], [place] [address] [place], [place] [address]  I-stat troponin, ED     Status: None   Collection Time: 06/24/17  1:38 AM  Result Value Ref Range   Troponin i, poc 0.07 0.00 - 0.08 ng/mL   Comment 3            Comment: Due to the release kinetics of cTnI, a negative result within the first hours of the onset of symptoms does not rule out myocardial infarction with certainty. If myocardial infarction is still suspected, repeat the test at appropriate intervals.     Dg Chest 2 View  Result Date: 06/24/2017 CLINICAL DATA:  Chest and back  pain, abdominal pain. History of end-stage renal disease on dialysis. EXAM: CHEST  2 VIEW COMPARISON:  Chest radiograph May 06, 2017 FINDINGS: Cardiac silhouette is mildly enlarged. Pulmonary vascular congestion, prominent bronchovascular markings with patchy RIGHT lung base airspace opacity. Slight blunting of the costophrenic angles without frank pleural effusion. No pneumothorax. Soft tissue planes and included osseous structures are nonacute. Potential chronic deformity of the RIGHT scapula versus projectional artifact. IMPRESSION: Mild cardiomegaly. Prominent bronchovascular structures seen with pulmonary edema or atypical infection with confluent edema versus pneumonia RIGHT lung base. Electronically Signed   By: Elon Alas M.D.   On: 06/24/2017 00:51    ROS: positive for productive cough, nasal congestion, fever, chills and chronic abdominal pain Blood pressure (!) 188/99, pulse (!) 112, temperature (!) 101.4 F (38.6 C), temperature source Oral, resp. rate (!) 26, SpO2 92 %. General appearance: fatigued and mild distress Resp: diminished breath sounds base - right Cardio: regular rate and rhythm, S1, S2 normal, no murmur, click, rub or gallop GI: soft, non-tender; bowel sounds normal; no masses,  no organomegaly Extremities: extremities normal, atraumatic, no cyanosis or edema left AVF- accessed for HD  Assessment/Plan: 22 year old BM with ESRD presenting with a febrile illness and hyperkalemia 1 Hyperkalemia- likely due to no HD since Friday, has run some borderline high Ks in the past.  HD emergently zero K followed by 1 K - adjust as needed 2 ESRD: normally TTS [facility]- compliance issues, has AVF 3 Hypertension: malignant and not unusual for pt.  Volume could be playing role, UF as able.  Supposedly on home amlodipine/hydralazine/clonidine and labetalol- suspect may not be compliant.  High BP seems to be the norm for him 4. Anemia of ESRD: not major issue right now- hgb over 10-  normally no ESA from last data 5. Metabolic Bone Disease: cont hectorol- renagel for binding, follow phos 6. Febrile illness- CXR suggestive of PNA- per primary team- rocephin/azithro- screen for flu    Chealsey Miyamoto A 06/24/2017, 6:31 AM

## 2017-06-24 NOTE — ED Notes (Signed)
Report given to dilaysis RN. Patient transported on tele to dialysis.  All belongings taken with patient

## 2017-06-24 NOTE — H&P (Signed)
History and Physical    Jeremy GoldenDeanthony Weight QBH:[id number] DOB: [birth year] DOA: 06/23/2017  PCP: Charlotte Sanesobinson, Todd Wayne, MD Consultants: Nephrology   Patient coming from:  Home - lives with a roommate   Chief Complaint: Abdominal pain, nausea vomiting, diarrhea, body ache, cough HPI: Jeremy Huerta is a 22 y.o. male with medical history significant of end-stage renal disease on hemodialysis and [facility], poorly controlled hypertension, respiratory failure, chronic abdominal pain with nausea and vomiting who presented to the ED with complaints of severe body ache and weakness, multiple episodes of diarrhea with abdominal pain nausea and vomiting, cough productive of yellow sputum and severe weakness.  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 Abdominal pain is a chronic issue and he has had extensive work-up with no clear etiology, including multiple CT scans of abdomen/pelvis, EGD, capsule endoscopy, and colonoscopy. 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   ED Course: He was febrile on presentation with 102.3 F, blood pressure was elevated 212/158 mmHg, respirations 92% on room air His potassium was elevated to 7.5 patient was emergently taken to hemodialysis  Review of Systems: As per HPI; otherwise review of systems reviewed and negative.   Ambulatory Status: Ambulates without assistance  Past Medical History:  Diagnosis Date  . Acute respiratory failure (HCC)   . ESRD (end stage renal disease) (HCC)    TTS  . FSGS (focal segmental glomerulosclerosis)   . Hypertension   . Nausea & vomiting 11/2016    Past Surgical History:  Procedure Laterality Date  . AV FISTULA PLACEMENT  12/2015  . RENAL BIOPSY  2013    Social History   Socioeconomic History  . Marital status: Single    Spouse name: Not on file  . Number of children: Not on file  . Years of education: Not on file  . Highest education level: Not on file  Social  Needs  . Financial resource strain: Not on file  . Food insecurity - worry: Not on file  . Food insecurity - inability: Not on file  . Transportation needs - medical: Not on file  . Transportation needs - non-medical: Not on file  Occupational History  . Occupation: [employer] employee  Tobacco Use  . Smoking status: Never Smoker  . Smokeless tobacco: Never Used  Substance and Sexual Activity  . Alcohol use: No  . Drug use: No  . Sexual activity: Not on file  Other Topics Concern  . Not on file  Social History Narrative  . Not on file    No Known Allergies  Family History  Problem Relation Age of Onset  . Hypertension Mother   . Diabetes Father   . Hypertension Father   . Kidney disease Father   . Hypertension Maternal Grandmother   . Diabetes Paternal Grandmother   . Hypertension Paternal Grandmother     Prior to Admission medications   Medication Sig Start Date End Date Taking? Authorizing Provider  amLODipine (NORVASC) 10 MG tablet Take 1 tablet (10 mg total) daily by mouth. 04/04/17  Yes Ollis, Brandi L, NP  B Complex-C-Folic Acid (B COMPLEX-VITAMIN C-FOLIC ACID) 1 MG tablet Take 1 tablet by mouth daily. 04/30/16  Yes [provider]  cinacalcet (SENSIPAR) 30 MG tablet Take 2 tablets (60 mg total) by mouth daily with supper. Patient taking differently: Take 60 mg by mouth daily.  12/04/16  Yes Rolly SalterPatel, Pranav M, MD  cloNIDine (CATAPRES) 0.2 MG tablet Take 1 tablet (0.2  mg total) by mouth 3 (three) times daily. 04/30/17  Yes Scherrie Gerlach, MD  fluticasone [facility]) 50 MCG/ACT nasal spray Place 1 spray daily into both nostrils. Patient taking differently: Place 1 spray into both nostrils daily as needed for allergies or rhinitis.  04/05/17  Yes Ollis, Brandi L, NP  hydrALAZINE (APRESOLINE) 100 MG tablet Take 100 mg by mouth 3 (three) times daily.  04/13/16  Yes [provider]  HYDROcodone-acetaminophen (NORCO/VICODIN) 5-325 MG tablet Take 1-2 tablets by  mouth every 6 (six) hours as needed. Patient taking differently: Take 1-2 tablets by mouth every 6 (six) hours as needed.  05/06/17  Yes Horton, Mayer Masker, MD  isosorbide mononitrate (IMDUR) 30 MG 24 hr tablet Take 3 tablets (90 mg total) daily by mouth. 04/04/17  Yes Ollis, Brandi L, NP  labetalol (NORMODYNE) 200 MG tablet Take 200 mg by mouth 3 (three) times daily.  03/16/16  Yes [provider]  ondansetron (ZOFRAN ODT) 4 MG disintegrating tablet Take 1 tablet (4 mg total) by mouth every 8 (eight) hours as needed for nausea or vomiting. 05/06/17  Yes Horton, Mayer Masker, MD  sevelamer (RENAGEL) 800 MG tablet Take 800-3,200 mg by mouth See admin instructions. 2,400-3,200 mg three times a day with meals and 800 mg with each snack   Yes [provider]    Physical Exam: Vitals:   06/24/17 0830 06/24/17 0900 06/24/17 0930 06/24/17 1000  BP: (!) 205/130 (!) 211/126 (!) 201/133 (!) 181/115  Pulse: 88 93 89 93  Resp:      Temp:      TempSrc:      SpO2:      Weight:         General:  Appears calm and comfortable Eyes: PERRL, normal lids, irises & conjunctiva ENT: grossly normal hearing, lips & tongue Neck: no LAD, masses or thyromegaly Cardiovascular: RRR, no m/r/g. mild LE edema. Telemetry: SR, no arrhythmias  Respiratory: Coarse BS bilaterally, no w/r/r. Normal respiratory effort. Abdomen: soft, tender around umbilicus and LLQ Skin: no rash or induration seen on limited exam Musculoskeletal: grossly normal tone BUE/BLE Psychiatric: grossly normal mood and affect, speech fluent and appropriate Neurologic: grossly non-focal.        Radiological Exams on Admission: Dg Chest 2 View  Result Date: 06/24/2017 CLINICAL DATA:  Chest and back pain, abdominal pain. History of end-stage renal disease on dialysis. EXAM: CHEST  2 VIEW COMPARISON:  Chest radiograph May 06, 2017 FINDINGS: Cardiac silhouette is mildly enlarged. Pulmonary vascular congestion, prominent  bronchovascular markings with patchy RIGHT lung base airspace opacity. Slight blunting of the costophrenic angles without frank pleural effusion. No pneumothorax. Soft tissue planes and included osseous structures are nonacute. Potential chronic deformity of the RIGHT scapula versus projectional artifact. IMPRESSION: Mild cardiomegaly. Prominent bronchovascular structures seen with pulmonary edema or atypical infection with confluent edema versus pneumonia RIGHT lung base. Electronically Signed   By: Awilda Metro M.D.   On: 06/24/2017 00:51    EKG: Independently reviewed.  NSR with rate 101 ; nonspecific ST changes with no evidence of acute ischemia   Labs on Admission: I have personally reviewed the available labs and imaging studies at the time of the admission.  Pertinent labs:   White blood cells count 11,900,  potassium >7.5, creatinine 22.68    Assessment/Plan Principal Problem:   HCAP (healthcare-associated pneumonia) Active Problems:   Hypertension   Diarrhea   HAP (hospital-acquired pneumonia)   Sepsis (HCC)   Abdominal pain   HCAP  and sepsis secondary to HCAP: Patient presented with fever, elevated white blood cell count and chest x-ray suspicious for right base pneumonia. Influenza panel was ordered Blood cultures are in process Will check Lactic acid and Procalcitonin, sputum culture Start IV Vancomycin and cefepime, he received two boluses of NS - 1,000 mL each, will refrain from further IV fluids for now given ESRD and HD Mucinex for cough and nebulizer treatments for SOB/wheezing prn  Abdominal pain with diarrhea and vomiting Will order Cdiff PCR Will start with clear liquids and expand diet as tolerated  HTN with hypertensive urgency BP on admission was 212/138 mmHg.  Home regimen includes amlodipine, clonidine, hydralazine, labetalol, and imdur.  He stated taking all his meds as prescribed. BP improved after \\IV  Hydralazine All home medications restarted,  will follow BP readings - might need dose adjustment  ESRD with HD TuThSa - patient was assessed in HD unit. Continue the same schedule     DVT prophylaxis: heparin Code Status: Full - confirmed with patient/family Family Communication: none Disposition Plan: Home once clinically improved Consults called: Renal  Admission status: inpatient   Raymon Mutton PA-C [phone] [facility]  If note is complete, please contact covering daytime or nighttime physician. [email or web address] Password [email or web address]  06/24/2017, 10:20 AM

## 2017-06-25 DIAGNOSIS — J189 Pneumonia, unspecified organism: Secondary | ICD-10-CM

## 2017-06-25 LAB — CBC
HEMATOCRIT: 30.2 % — AB (ref 39.0–52.0)
Hemoglobin: 9.4 g/dL — ABNORMAL LOW (ref 13.0–17.0)
MCH: 28.7 pg (ref 26.0–34.0)
MCHC: 31.1 g/dL (ref 30.0–36.0)
MCV: 92.1 fL (ref 78.0–100.0)
Platelets: 190 10*3/uL (ref 150–400)
RBC: 3.28 MIL/uL — AB (ref 4.22–5.81)
RDW: 17.2 % — ABNORMAL HIGH (ref 11.5–15.5)
WBC: 10 10*3/uL (ref 4.0–10.5)

## 2017-06-25 LAB — BASIC METABOLIC PANEL
Anion gap: 15 (ref 5–15)
BUN: 48 mg/dL — AB (ref 6–20)
CO2: 25 mmol/L (ref 22–32)
CREATININE: 14.79 mg/dL — AB (ref 0.61–1.24)
Calcium: 8 mg/dL — ABNORMAL LOW (ref 8.9–10.3)
Chloride: 97 mmol/L — ABNORMAL LOW (ref 101–111)
GFR calc non Af Amer: 4 mL/min — ABNORMAL LOW (ref >60)
GFR, EST AFRICAN AMERICAN: 5 mL/min — AB (ref >60)
Glucose, Bld: 92 mg/dL (ref 65–99)
POTASSIUM: 4.6 mmol/L (ref 3.5–5.1)
SODIUM: 137 mmol/L (ref 135–145)

## 2017-06-25 LAB — HIV ANTIBODY (ROUTINE TESTING W REFLEX): HIV Screen 4th Generation wRfx: NONREACTIVE

## 2017-06-25 MED ORDER — VANCOMYCIN HCL IN DEXTROSE 1-5 GM/200ML-% IV SOLN: 1000.0000 mg | INTRAVENOUS | Status: DC

## 2017-06-25 NOTE — Progress Notes (Signed)
[facility] Progress Note  Subjective: feeling a lot better, temp up 101 today, better  Vitals:   06/25/17 0538 06/25/17 0613 06/25/17 1006 06/25/17 1043  BP: (!) 147/81  (!) 159/94   Pulse: 82  86   Resp: 18  18   Temp: 98.2 F (36.8 C)  99.3 F (37.4 C)   TempSrc: Oral  Oral   SpO2: 100%  100%   Weight:  94.3 kg (208 lb)  90.7 kg (199 lb 15.3 oz)  Height:        Inpatient medications: . amLODipine  10 mg Oral Daily  . cinacalcet  60 mg Oral Daily  . cloNIDine  0.2 mg Oral TID  . heparin  4,600 Units Dialysis Once in dialysis  . heparin  5,000 Units Subcutaneous Q8H  . hydrALAZINE  100 mg Oral Q8H  . isosorbide mononitrate  90 mg Oral Daily  . labetalol  200 mg Oral TID  . sevelamer carbonate  800 mg Oral TID WC   . sodium chloride    . sodium chloride    . sodium chloride    . sodium chloride    . ceFEPime (MAXIPIME) IV 1 g (06/25/17 1012)   sodium chloride, sodium chloride, sodium chloride, sodium chloride, acetaminophen **OR** acetaminophen, alteplase, guaiFENesin, heparin, heparin, heparin, heparin, hydrALAZINE, HYDROcodone-acetaminophen, lidocaine (PF), lidocaine-prilocaine, ondansetron **OR** ondansetron (ZOFRAN) IV, pentafluoroprop-tetrafluoroeth  Exam: General: alert, no distress Resp: clear bilat Cardio: RRR no mrg GI: soft, non-tender; bowel sounds normal Ext: no edema left AVF- accessed for HD    [facility]    84.5kg   K/Ca:2/2.25 bath  L AVF 15g 450/800 P2  Hep 4600 VDRA:hectorol 6mcg qTx no ESA or Fe Treatment Adherence:freq s/o early      Impression: 1  Hyperkalemia , better 2  ESRD HD tts 3  Fever , pna, viral? 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   Plan - HD tomorrow   Jeremy Moselleob [name] MD [facility] pager [phone]   06/25/2017, 11:41 AM   Recent Labs  Lab 06/24/17 0130 06/24/17 1101 06/24/17 1154 06/25/17 0258  NA 139  --  141 137  K  >7.5* 4.9 4.1 4.6  CL 100*  --  98* 97*  CO2 18*  --  25 25  GLUCOSE 76  --  98 92  BUN 75*  --  34* 48*  CREATININE 22.68*  --  12.42*  12.66* 14.79*  CALCIUM 9.2  --  9.0 8.0*  PHOS  --   --  4.5  --    Recent Labs  Lab 06/24/17 0130 06/24/17 1154  AST 17  --   ALT 17  --   ALKPHOS 55  --   BILITOT 0.9  --   PROT 7.4  --   ALBUMIN 4.1 3.7   Recent Labs  Lab 06/24/17 0130 06/24/17 1154 06/25/17 0258  WBC 11.9* 12.3* 10.0  HGB 10.6* 10.2* 9.4*  HCT 33.2* 32.2* 30.2*  MCV 91.7 91.5 92.1  PLT 232 210 190   Iron/TIBC/Ferritin/ %Sat No results found for: IRON, TIBC, FERRITIN, IRONPCTSAT

## 2017-06-25 NOTE — Progress Notes (Signed)
PROGRESS NOTE [facility]   Jeremy GoldenDeanthony Warmack   FAO:[id number] DOB: 1996-02-23  DOA: 06/23/2017 PCP: Charlotte Sanesobinson, Todd Wayne, MD   Brief Narrative:  Jeremy Huerta is a 22 year old male with past medical history of end-stage renal disease, hypertension who presented to the emergency department complaining of cough and shortness of breath.  Upon ED evaluation potassium was found to be 7.5.  With calcium gluconate, bicarb, insulin/glucose and albuterol.  Chest x-ray revealed signs of pneumonia.  Patient was admitted with working diagnosis of sepsis secondary to HCAP.   Subjective: Patient seen and examined, report feeling significantly better than yesterday.  Febrile today to 101.  Denies chest pain, shortness of breath and palpitation.  + Cough is nonproductive.  Assessment & Plan: Sepsis secondary to HCAP Sepsis physiology has resolved Pro-calcitonin elevated, in setting of end-stage renal disease this is not reliable Flu PCR negative, MRSA PCR negative Strep antigen pending  Patient initially started on IV vancomycin and cefepime, DC vancomycin for now. Follow-up blood cultures and sputum culture Patient reported not having a bowel movement for 2 days therefore will d/c C. difficile w/u   ESRD with hyperkalemia HD per renal  Potassium normalized after dialysis  HTN  BP seems to be stable Continue Norvasc, Catapres, Imdur, labetalol. 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 Hemoglobin stable Monitor  DVT prophylaxis: Heparin Code Status: Full code Family Communication: None at bedside Disposition Plan: Home in 1-2 days  Consultants:   Nephrology  Procedures:   None  Antimicrobials: Anti-infectives (From admission, onward)   Start     Dose/Rate Route Frequency Ordered Stop   06/26/17 1200  vancomycin (VANCOCIN) IVPB 1000 mg/200 mL premix  Status:  Discontinued     1,000 mg 200 mL/hr over 60 Minutes Intravenous Every T-Th-Sa  (Hemodialysis) 06/25/17 0825 06/25/17 1021   06/25/17 0000  azithromycin (ZITHROMAX) 500 mg in dextrose 5 % 250 mL IVPB  Status:  Discontinued     500 mg 250 mL/hr over 60 Minutes Intravenous  Once 06/24/17 0938 06/24/17 0946   06/24/17 1100  ceFEPIme (MAXIPIME) 1 g in dextrose 5 % 50 mL IVPB     1 g 100 mL/hr over 30 Minutes Intravenous Every 24 hours 06/24/17 0956     06/24/17 1100  vancomycin (VANCOCIN) 2,000 mg in sodium chloride 0.9 % 500 mL IVPB     2,000 mg 250 mL/hr over 120 Minutes Intravenous  Once 06/24/17 0956 06/24/17 1412   06/24/17 1000  ceFEPIme (MAXIPIME) 500 mg in dextrose 5 % 50 mL IVPB  Status:  Discontinued     500 mg 100 mL/hr over 30 Minutes Intravenous Every 12 hours 06/24/17 0947 06/24/17 0954   06/24/17 0930  azithromycin (ZITHROMAX) 500 mg in dextrose 5 % 250 mL IVPB  Status:  Discontinued     500 mg 250 mL/hr over 60 Minutes Intravenous  Once 06/24/17 0921 06/24/17 1023   06/24/17 0345  cefTRIAXone (ROCEPHIN) 1 g in dextrose 5 % 50 mL IVPB     1 g 100 mL/hr over 30 Minutes Intravenous  Once 06/24/17 0341 06/24/17 0420   06/24/17 0345  azithromycin (ZITHROMAX) 500 mg in dextrose 5 % 250 mL IVPB  Status:  Discontinued     500 mg 250 mL/hr over 60 Minutes Intravenous  Once 06/24/17 0341 06/24/17 0921       Objective: Vitals:   06/25/17 0613 06/25/17 1006 06/25/17 1043 06/25/17 1423  BP:  (!) 159/94  (!) 148/75  Pulse:  86  80  Resp:  18  18  Temp:  99.3 F (37.4 C)  98.9 F (37.2 C)  TempSrc:  Oral  Oral  SpO2:  100%  99%  Weight: 94.3 kg (208 lb)  90.7 kg (199 lb 15.3 oz)   Height:        Intake/Output Summary (Last 24 hours) at 06/25/2017 1607 Last data filed at 06/25/2017 0400 Gross per 24 hour  Intake 720 ml  Output -  Net 720 ml   Filed Weights   06/24/17 1032 06/25/17 0613 06/25/17 1043  Weight: 87.9 kg (193 lb 12.6 oz) 94.3 kg (208 lb) 90.7 kg (199 lb 15.3 oz)    Examination:  General exam: Appears calm and comfortable  HEENT: OP  moist and clear Respiratory system: Good air entry right lower lobe rales.  No wheezing or crackles Cardiovascular system: S1 & S2 heard, RRR. No JVD, murmurs, rubs or gallops Gastrointestinal system: Abdomen is nondistended, soft and nontender. N Central nervous system: Alert and oriented. No focal neurological deficits. Extremities: No pedal edema.  Left AV fistula with good thrill Skin: No rashes, lesions or ulcers Psychiatry: Judgement and insight appear normal. Mood & affect appropriate.    Data Reviewed: I have personally reviewed following labs and imaging studies  CBC: Recent Labs  Lab 06/24/17 0130 06/24/17 1154 06/25/17 0258  WBC 11.9* 12.3* 10.0  HGB 10.6* 10.2* 9.4*  HCT 33.2* 32.2* 30.2*  MCV 91.7 91.5 92.1  PLT 232 210 190   Basic Metabolic Panel: Recent Labs  Lab 06/24/17 0130 06/24/17 1101 06/24/17 1154 06/25/17 0258  NA 139  --  141 137  K >7.5* 4.9 4.1 4.6  CL 100*  --  98* 97*  CO2 18*  --  25 25  GLUCOSE 76  --  98 92  BUN 75*  --  34* 48*  CREATININE 22.68*  --  12.42*  12.66* 14.79*  CALCIUM 9.2  --  9.0 8.0*  PHOS  --   --  4.5  --    GFR: Estimated Creatinine Clearance: 8.5 mL/min (A) (by C-G formula based on SCr of 14.79 mg/dL (H)). Liver Function Tests: Recent Labs  Lab 06/24/17 0130 06/24/17 1154  AST 17  --   ALT 17  --   ALKPHOS 55  --   BILITOT 0.9  --   PROT 7.4  --   ALBUMIN 4.1 3.7   No results for input(s): LIPASE, AMYLASE in the last 168 hours. No results for input(s): AMMONIA in the last 168 hours. Coagulation Profile: Recent Labs  Lab 06/24/17 1154  INR 1.23   Cardiac Enzymes: No results for input(s): CKTOTAL, CKMB, CKMBINDEX, TROPONINI in the last 168 hours. BNP (last 3 results) No results for input(s): PROBNP in the last 8760 hours. HbA1C: No results for input(s): HGBA1C in the last 72 hours. CBG: No results for input(s): GLUCAP in the last 168 hours. Lipid Profile: No results for input(s): CHOL, HDL,  LDLCALC, TRIG, CHOLHDL, LDLDIRECT in the last 72 hours. Thyroid Function Tests: No results for input(s): TSH, T4TOTAL, FREET4, T3FREE, THYROIDAB in the last 72 hours. Anemia Panel: No results for input(s): VITAMINB12, FOLATE, FERRITIN, TIBC, IRON, RETICCTPCT in the last 72 hours. Sepsis Labs: Recent Labs  Lab 06/24/17 1154  PROCALCITON 33.90  LATICACIDVEN 0.9    Recent Results (from the past 240 hour(s))  Respiratory Panel by PCR     Status: None   Collection Time: 06/24/17 12:47 PM  Result Value  Ref Range Status   Adenovirus NOT DETECTED NOT DETECTED Final   Coronavirus 229E NOT DETECTED NOT DETECTED Final   Coronavirus HKU1 NOT DETECTED NOT DETECTED Final   Coronavirus NL63 NOT DETECTED NOT DETECTED Final   Coronavirus OC43 NOT DETECTED NOT DETECTED Final   Metapneumovirus NOT DETECTED NOT DETECTED Final   Rhinovirus / Enterovirus NOT DETECTED NOT DETECTED Final   Influenza A NOT DETECTED NOT DETECTED Final   Influenza B NOT DETECTED NOT DETECTED Final   Parainfluenza Virus 1 NOT DETECTED NOT DETECTED Final   Parainfluenza Virus 2 NOT DETECTED NOT DETECTED Final   Parainfluenza Virus 3 NOT DETECTED NOT DETECTED Final   Parainfluenza Virus 4 NOT DETECTED NOT DETECTED Final   Respiratory Syncytial Virus NOT DETECTED NOT DETECTED Final   Bordetella pertussis NOT DETECTED NOT DETECTED Final   Chlamydophila pneumoniae NOT DETECTED NOT DETECTED Final   Mycoplasma pneumoniae NOT DETECTED NOT DETECTED Final    Comment: Performed at [facility], [address], [place], [place] [address]  MRSA PCR Screening     Status: None   Collection Time: 06/24/17 12:48 PM  Result Value Ref Range Status   MRSA by PCR NEGATIVE NEGATIVE Final    Comment:        The GeneXpert MRSA Assay (FDA approved for NASAL specimens only), is one component of a comprehensive MRSA colonization surveillance program. It is not intended to diagnose MRSA infection nor to guide or monitor treatment  for MRSA infections. Performed at [facility], [address], [place], [place] [address]       Radiology Studies: Dg Chest 2 View  Result Date: 06/24/2017 CLINICAL DATA:  Chest and back pain, abdominal pain. History of end-stage renal disease on dialysis. EXAM: CHEST  2 VIEW COMPARISON:  Chest radiograph May 06, 2017 FINDINGS: Cardiac silhouette is mildly enlarged. Pulmonary vascular congestion, prominent bronchovascular markings with patchy RIGHT lung base airspace opacity. Slight blunting of the costophrenic angles without frank pleural effusion. No pneumothorax. Soft tissue planes and included osseous structures are nonacute. Potential chronic deformity of the RIGHT scapula versus projectional artifact. IMPRESSION: Mild cardiomegaly. Prominent bronchovascular structures seen with pulmonary edema or atypical infection with confluent edema versus pneumonia RIGHT lung base. Electronically Signed   By: Awilda Metro M.D.   On: 06/24/2017 00:51      Scheduled Meds: . amLODipine  10 mg Oral Daily  . cinacalcet  60 mg Oral Daily  . cloNIDine  0.2 mg Oral TID  . heparin  4,600 Units Dialysis Once in dialysis  . heparin  5,000 Units Subcutaneous Q8H  . hydrALAZINE  100 mg Oral Q8H  . isosorbide mononitrate  90 mg Oral Daily  . labetalol  200 mg Oral TID  . sevelamer carbonate  800 mg Oral TID WC   Continuous Infusions: . sodium chloride    . sodium chloride    . sodium chloride    . sodium chloride    . ceFEPime (MAXIPIME) IV 1 g (06/25/17 1012)     LOS: 1 day    Time spent: Total of 25 minutes spent with pt, greater than 50% of which was spent in discussion of  treatment, counseling and coordination of care   Latrelle Dodrill, MD Pager: Text Page via [email or web address]   If 7PM-7AM, please contact night-coverage [email or web address] 06/25/2017, 4:07 PM

## 2017-06-25 NOTE — Progress Notes (Signed)
Pharmacy Antibiotic Note  Jeremy GoldenDeanthony Huerta is a 22 y.o. male admitted on 06/23/2017 with pneumonia.  Patient has ESRD on HD TTS.  He tolerated HD session on 06/24/17.  Tmax 102.3, WBC normalized, LA 0.9, PCT 34.   Plan: Vanc 1gm IV qHD TTS  Cefepime 1gm IV Q24H  Monitor HD tolerance, clinical progress, vanc pre-HD level as indicated   Height: 6' (182.9 cm) Weight: 208 lb (94.3 kg) IBW/kg (Calculated) : 77.6  Temp (24hrs), Avg:99.5 F (37.5 C), Min:98.2 F (36.8 C), Max:102.2 F (39 C)  Recent Labs  Lab 06/24/17 0130 06/24/17 1154 06/25/17 0258  WBC 11.9* 12.3* 10.0  CREATININE 22.68* 12.42*  12.66* 14.79*  LATICACIDVEN  --  0.9  --     Estimated Creatinine Clearance: 9.3 mL/min (A) (by C-G formula based on SCr of 14.79 mg/dL (H)).    No Known Allergies   CTX 2/5 >> 2/5 Azith 2/5 >> 2/5 Vanc 2/5 >>  2/5 BCx -  2/5 Flu - negative 2/5 resp panel PCR - negative 2/5 MRSA PCR - negative   Jeremy Huerta D. Laney Potashang, PharmD, BCPS Pager:  [phone] 06/25/2017, 8:25 AM

## 2017-06-26 LAB — BASIC METABOLIC PANEL
ANION GAP: 19 — AB (ref 5–15)
BUN: 64 mg/dL — ABNORMAL HIGH (ref 6–20)
CALCIUM: 8.1 mg/dL — AB (ref 8.9–10.3)
CO2: 22 mmol/L (ref 22–32)
Chloride: 95 mmol/L — ABNORMAL LOW (ref 101–111)
Creatinine, Ser: 17.56 mg/dL — ABNORMAL HIGH (ref 0.61–1.24)
GFR calc Af Amer: 4 mL/min — ABNORMAL LOW (ref >60)
GFR, EST NON AFRICAN AMERICAN: 3 mL/min — AB (ref >60)
GLUCOSE: 91 mg/dL (ref 65–99)
POTASSIUM: 5 mmol/L (ref 3.5–5.1)
SODIUM: 136 mmol/L (ref 135–145)

## 2017-06-26 LAB — CBC
HEMATOCRIT: 29.6 % — AB (ref 39.0–52.0)
HEMOGLOBIN: 9.4 g/dL — AB (ref 13.0–17.0)
MCH: 28.7 pg (ref 26.0–34.0)
MCHC: 31.8 g/dL (ref 30.0–36.0)
MCV: 90.5 fL (ref 78.0–100.0)
Platelets: 197 10*3/uL (ref 150–400)
RBC: 3.27 MIL/uL — ABNORMAL LOW (ref 4.22–5.81)
RDW: 16.3 % — AB (ref 11.5–15.5)
WBC: 7.2 10*3/uL (ref 4.0–10.5)

## 2017-06-26 MED ORDER — HEPARIN SODIUM (PORCINE) 1000 UNIT/ML DIALYSIS: 4600.0000 [IU] | Freq: Once | INTRAMUSCULAR | Status: DC

## 2017-06-26 MED ORDER — AMOXICILLIN-POT CLAVULANATE 875-125 MG PO TABS: 1.0000 | ORAL_TABLET | Freq: Two times a day (BID) | ORAL | 0 refills | Status: AC

## 2017-06-26 NOTE — Progress Notes (Signed)
Pt c/o not feeling good; said he had some chest discomfort and headache but stated the chest discomfort has happened before and not new. VSS: pt placed on 2L and prn norco adm. Pt said chest discomfort better and resting comfortably in bed with call light within reach. Dr. Edward JollySilva notified and no new orders received. Will continue to closely monitor pt. Dionne BucyP. Amo Kelsy Polack RN

## 2017-06-26 NOTE — Progress Notes (Signed)
Hemodialysis completed without issue. Patient had a lot of cramping during the last hour of treatment. Only able to UF 4.7 out of 5-6L goal due to issue. Vitals remained stable throughout. Post HD standing weight 87.1kg.  May need edw adjusted, will notify MD. Patient currently without complaints. Left unit in stable condition via transport team.

## 2017-06-26 NOTE — Discharge Summary (Signed)
Physician Discharge Summary  Jeremy Huerta  ZOX:[id number]  DOB: 24-Aug-1995  DOA: 06/23/2017 PCP: [name], MD  Admit date: 06/23/2017 Discharge date: 06/26/2017  Admitted From: Home  Disposition:  Home   Recommendations for Outpatient Follow-up:  1. Follow up with PCP in 1-2 weeks 2. Please obtain BMP/CBC in one week to monitor hemoglobin and creatinine 3. Complete course of antibiotics for 8 days 4. Follow-up with hemodialysis as scheduled  Discharge Condition: Stable CODE STATUS: Full code Diet recommendation: Heart Healthy -renal  Brief/Interim Summary: For full details see H&P/Progress note, but in brief, Jeremy Huerta is a 22 year old male with past medical history of end-stage renal disease, hypertension who presented to the emergency department complaining of cough and shortness of breath.  Upon ED evaluation potassium was found to be 7.5.  With calcium gluconate, bicarb, insulin/glucose and albuterol.  Chest x-ray revealed signs of pneumonia.  Patient was admitted with working diagnosis of sepsis secondary to HCAP.   Patient treated with empiric IV antibiotics, and subsequently patient significantly improved.  Patient received hemodialysis during hospital stay.  Patient was deemed stable for discharge.  Subjective: Patient seen and examined, no new complaints.  Cough has resolved.  Denies chest pain, shortness of breath, palpitations and dizziness.  Discharge Diagnoses/Hospital Course:  Sepsis secondary to HCAP Sepsis physiology has resolved Pro-calcitonin elevated, in setting of end-stage renal disease this is not reliable Flu PCR negative, MRSA PCR negative Unable to produce urine strep antigen was not done  Patient initially started on IV vancomycin and cefepime, vancomycin d/ced as MRSA was negative  Transitioned to Augmentin to complete 10 days of abx tx  Follow-up blood cultures and sputum culture so far negative   ESRD with hyperkalemia HD per renal   Potassium normalized after dialysis  HTN  BP stable  Continue Norvasc, Catapres, Imdur, labetalol. 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  On the day of the discharge the patient's vitals were stable, and no other acute medical condition were reported by patient. the patient was felt safe to be discharge to home   Discharge Instructions  You were cared for by a hospitalist during your hospital stay. If you have any questions about your discharge medications or the care you received while you were in the hospital after you are discharged, you can call the unit and asked to speak with the hospitalist on call if the hospitalist that took care of you is not available. Once you are discharged, your primary care physician will handle any further medical issues. Please note that NO REFILLS for any discharge medications will be authorized once you are discharged, as it is imperative that you return to your primary care physician (or establish a relationship with a primary care physician if you do not have one) for your aftercare needs so that they can reassess your need for medications and monitor your lab values.  Discharge Instructions    Call MD for:  difficulty breathing, headache or visual disturbances   Complete by:  As directed    Call MD for:  extreme fatigue   Complete by:  As directed    Call MD for:  hives   Complete by:  As directed    Call MD for:  persistant dizziness or light-headedness   Complete by:  As directed    Call MD for:  persistant nausea  and vomiting   Complete by:  As directed    Call MD for:  redness, tenderness, or signs of infection (pain, swelling, redness, odor or green/yellow discharge around incision site)   Complete by:  As directed    Call MD for:  severe uncontrolled pain   Complete by:  As directed     Call MD for:  temperature >100.4   Complete by:  As directed    Diet - low sodium heart healthy   Complete by:  As directed    Increase activity slowly   Complete by:  As directed      Allergies as of 06/26/2017   No Known Allergies     Medication List    TAKE these medications   amLODipine 10 MG tablet Commonly known as:  NORVASC Take 1 tablet (10 mg total) daily by mouth.   amoxicillin-clavulanate 875-125 MG tablet Commonly known as:  AUGMENTIN Take 1 tablet by mouth every 12 (twelve) hours for 8 days.   B complex-vitamin C-folic acid 1 MG tablet Take 1 tablet by mouth daily.   cinacalcet 30 MG tablet Commonly known as:  SENSIPAR Take 2 tablets (60 mg total) by mouth daily with supper. What changed:  when to take this   cloNIDine 0.2 MG tablet Commonly known as:  CATAPRES Take 1 tablet (0.2 mg total) by mouth 3 (three) times daily.   fluticasone 50 MCG/ACT nasal spray Commonly known as:  FLONASE Place 1 spray daily into both nostrils. What changed:    when to take this  reasons to take this   hydrALAZINE 100 MG tablet Commonly known as:  APRESOLINE Take 100 mg by mouth 3 (three) times daily.   HYDROcodone-acetaminophen 5-325 MG tablet Commonly known as:  NORCO/VICODIN Take 1-2 tablets by mouth every 6 (six) hours as needed.   isosorbide mononitrate 30 MG 24 hr tablet Commonly known as:  IMDUR Take 3 tablets (90 mg total) daily by mouth.   labetalol 200 MG tablet Commonly known as:  NORMODYNE Take 200 mg by mouth 3 (three) times daily.   ondansetron 4 MG disintegrating tablet Commonly known as:  ZOFRAN ODT Take 1 tablet (4 mg total) by mouth every 8 (eight) hours as needed for nausea or vomiting.   sevelamer 800 MG tablet Commonly known as:  RENAGEL Take 800-3,200 mg by mouth See admin instructions. 2,400-3,200 mg three times a day with meals and 800 mg with each snack      Follow-up Information    [name], MD. Schedule an appointment  as soon as possible for a visit in 1 week(s).   Specialty:  Nephrology Why:  Hospital follow-up Contact information: [address] [place] [address] [phone]          No Known Allergies  Consultations: Nephrology   Procedures/Studies: Dg Chest 2 View  Result Date: 06/24/2017 CLINICAL DATA:  Chest and back pain, abdominal pain. History of end-stage renal disease on dialysis. EXAM: CHEST  2 VIEW COMPARISON:  Chest radiograph May 06, 2017 FINDINGS: Cardiac silhouette is mildly enlarged. Pulmonary vascular congestion, prominent bronchovascular markings with patchy RIGHT lung base airspace opacity. Slight blunting of the costophrenic angles without frank pleural effusion. No pneumothorax. Soft tissue planes and included osseous structures are nonacute. Potential chronic deformity of the RIGHT scapula versus projectional artifact. IMPRESSION: Mild cardiomegaly. Prominent bronchovascular structures seen with pulmonary edema or atypical infection with confluent edema versus pneumonia RIGHT lung base. Electronically Signed   By: Awilda Metro  M.D.   On: 06/24/2017 00:51    Discharge Exam: Vitals:   06/26/17 1100 06/26/17 1355  BP: (!) 149/77 (!) 149/77  Pulse: 78 79  Resp: 16 20  Temp: 98.4 F (36.9 C) 99.4 F (37.4 C)  SpO2: 95% 98%   Vitals:   06/26/17 1030 06/26/17 1050 06/26/17 1100 06/26/17 1355  BP: (!) 148/63 (!) 149/77 (!) 149/77 (!) 149/77  Pulse: 95 79 78 79  Resp: 16 20 16 20   Temp:   98.4 F (36.9 C) 99.4 F (37.4 C)  TempSrc:   Oral Oral  SpO2:   95% 98%  Weight:   87.1 kg (192 lb 0.3 oz)   Height:        General: Pt is alert, awake, not in acute distress Cardiovascular: RRR, S1/S2 +, no rubs, no gallops Respiratory: CTA bilaterally, no wheezing, no rhonchi Abdominal: Soft, NT, ND, bowel sounds + Extremities: no edema, Left AVF good thrill    The results of significant diagnostics from this hospitalization (including imaging,  microbiology, ancillary and laboratory) are listed below for reference.     Microbiology: Recent Results (from the past 240 hour(s))  Culture, blood (routine x 2)     Status: None (Preliminary result)   Collection Time: 06/24/17  1:30 AM  Result Value Ref Range Status   Specimen Description BLOOD RIGHT ANTECUBITAL  Final   Special Requests   Final    BOTTLES DRAWN AEROBIC AND ANAEROBIC Blood Culture adequate volume   Culture   Final    NO GROWTH 1 DAY Performed at [facility], [address], [place], [place] [address]    Report Status PENDING  Incomplete  Culture, blood (routine x 2)     Status: None (Preliminary result)   Collection Time: 06/24/17  8:40 AM  Result Value Ref Range Status   Specimen Description BLOOD A-LINE DRAW  Final   Special Requests   Final    BOTTLES DRAWN AEROBIC AND ANAEROBIC Blood Culture adequate volume   Culture   Final    NO GROWTH 1 DAY Performed at [facility], [address], [place], [place] [address]    Report Status PENDING  Incomplete  Culture, blood (Routine X 2) w Reflex to ID Panel     Status: None (Preliminary result)   Collection Time: 06/24/17  8:57 AM  Result Value Ref Range Status   Specimen Description BLOOD A-LINE DRAW  Final   Special Requests   Final    BOTTLES DRAWN AEROBIC AND ANAEROBIC Blood Culture adequate volume   Culture   Final    NO GROWTH 1 DAY Performed at [facility], [address], [place], [place] [address]    Report Status PENDING  Incomplete  Respiratory Panel by PCR     Status: None   Collection Time: 06/24/17 12:47 PM  Result Value Ref Range Status   Adenovirus NOT DETECTED NOT DETECTED Final   Coronavirus 229E NOT DETECTED NOT DETECTED Final   Coronavirus HKU1 NOT DETECTED NOT DETECTED Final   Coronavirus NL63 NOT DETECTED NOT DETECTED Final   Coronavirus OC43 NOT DETECTED NOT DETECTED Final   Metapneumovirus NOT DETECTED NOT DETECTED Final   Rhinovirus / Enterovirus NOT DETECTED  NOT DETECTED Final   Influenza A NOT DETECTED NOT DETECTED Final   Influenza B NOT DETECTED NOT DETECTED Final   Parainfluenza Virus 1 NOT DETECTED NOT DETECTED Final   Parainfluenza Virus 2 NOT DETECTED NOT DETECTED Final  Parainfluenza Virus 3 NOT DETECTED NOT DETECTED Final   Parainfluenza Virus 4 NOT DETECTED NOT DETECTED Final   Respiratory Syncytial Virus NOT DETECTED NOT DETECTED Final   Bordetella pertussis NOT DETECTED NOT DETECTED Final   Chlamydophila pneumoniae NOT DETECTED NOT DETECTED Final   Mycoplasma pneumoniae NOT DETECTED NOT DETECTED Final    Comment: Performed at [facility], [address], [place], [place] [address]  MRSA PCR Screening     Status: None   Collection Time: 06/24/17 12:48 PM  Result Value Ref Range Status   MRSA by PCR NEGATIVE NEGATIVE Final    Comment:        The GeneXpert MRSA Assay (FDA approved for NASAL specimens only), is one component of a comprehensive MRSA colonization surveillance program. It is not intended to diagnose MRSA infection nor to guide or monitor treatment for MRSA infections. Performed at [facility], [address] [place], [place] [address]      Labs: BNP (last 3 results) Recent Labs    01/13/17 1358  BNP 2,858.3*   Basic Metabolic Panel: Recent Labs  Lab 06/24/17 0130 06/24/17 1101 06/24/17 1154 06/25/17 0258 06/26/17 0559  NA 139  --  141 137 136  K >7.5* 4.9 4.1 4.6 5.0  CL 100*  --  98* 97* 95*  CO2 18*  --  25 25 22   GLUCOSE 76  --  98 92 91  BUN 75*  --  34* 48* 64*  CREATININE 22.68*  --  12.42*  12.66* 14.79* 17.56*  CALCIUM 9.2  --  9.0 8.0* 8.1*  PHOS  --   --  4.5  --   --    Liver Function Tests: Recent Labs  Lab 06/24/17 0130 06/24/17 1154  AST 17  --   ALT 17  --   ALKPHOS 55  --   BILITOT 0.9  --   PROT 7.4  --   ALBUMIN 4.1 3.7   No results for input(s): LIPASE, AMYLASE in the last 168 hours. No results for input(s): AMMONIA in the last 168  hours. CBC: Recent Labs  Lab 06/24/17 0130 06/24/17 1154 06/25/17 0258 06/26/17 0559  WBC 11.9* 12.3* 10.0 7.2  HGB 10.6* 10.2* 9.4* 9.4*  HCT 33.2* 32.2* 30.2* 29.6*  MCV 91.7 91.5 92.1 90.5  PLT 232 210 190 197   Cardiac Enzymes: No results for input(s): CKTOTAL, CKMB, CKMBINDEX, TROPONINI in the last 168 hours. BNP: Invalid input(s): POCBNP CBG: No results for input(s): GLUCAP in the last 168 hours. D-Dimer No results for input(s): DDIMER in the last 72 hours. Hgb A1c No results for input(s): HGBA1C in the last 72 hours. Lipid Profile No results for input(s): CHOL, HDL, LDLCALC, TRIG, CHOLHDL, LDLDIRECT in the last 72 hours. Thyroid function studies No results for input(s): TSH, T4TOTAL, T3FREE, THYROIDAB in the last 72 hours.  Invalid input(s): FREET3 Anemia work up No results for input(s): VITAMINB12, FOLATE, FERRITIN, TIBC, IRON, RETICCTPCT in the last 72 hours. Urinalysis    Component Value Date/Time   COLORURINE YELLOW 07/20/2014 0114   APPEARANCEUR CLOUDY (A) 07/20/2014 0114   LABSPEC 1.013 07/20/2014 0114   PHURINE 6.0 07/20/2014 0114   GLUCOSEU NEGATIVE 07/20/2014 0114   HGBUR SMALL (A) 07/20/2014 0114   BILIRUBINUR NEGATIVE 07/20/2014 0114   KETONESUR NEGATIVE 07/20/2014 0114   PROTEINUR >300 (A) 07/20/2014 0114   UROBILINOGEN 0.2 07/20/2014 0114   NITRITE NEGATIVE 07/20/2014 0114   LEUKOCYTESUR NEGATIVE 07/20/2014 0114   Sepsis Labs  Invalid input(s): PROCALCITONIN,  WBC,  LACTICIDVEN Microbiology Recent Results (from the past 240 hour(s))  Culture, blood (routine x 2)     Status: None (Preliminary result)   Collection Time: 06/24/17  1:30 AM  Result Value Ref Range Status   Specimen Description BLOOD RIGHT ANTECUBITAL  Final   Special Requests   Final    BOTTLES DRAWN AEROBIC AND ANAEROBIC Blood Culture adequate volume   Culture   Final    NO GROWTH 1 DAY Performed at [facility], [address] [place], [place] [address]    Report  Status PENDING  Incomplete  Culture, blood (routine x 2)     Status: None (Preliminary result)   Collection Time: 06/24/17  8:40 AM  Result Value Ref Range Status   Specimen Description BLOOD A-LINE DRAW  Final   Special Requests   Final    BOTTLES DRAWN AEROBIC AND ANAEROBIC Blood Culture adequate volume   Culture   Final    NO GROWTH 1 DAY Performed at [facility], [address] [place], [place] [address]    Report Status PENDING  Incomplete  Culture, blood (Routine X 2) w Reflex to ID Panel     Status: None (Preliminary result)   Collection Time: 06/24/17  8:57 AM  Result Value Ref Range Status   Specimen Description BLOOD A-LINE DRAW  Final   Special Requests   Final    BOTTLES DRAWN AEROBIC AND ANAEROBIC Blood Culture adequate volume   Culture   Final    NO GROWTH 1 DAY Performed at [facility], [address] [place], [place] [address]    Report Status PENDING  Incomplete  Respiratory Panel by PCR     Status: None   Collection Time: 06/24/17 12:47 PM  Result Value Ref Range Status   Adenovirus NOT DETECTED NOT DETECTED Final   Coronavirus 229E NOT DETECTED NOT DETECTED Final   Coronavirus HKU1 NOT DETECTED NOT DETECTED Final   Coronavirus NL63 NOT DETECTED NOT DETECTED Final   Coronavirus OC43 NOT DETECTED NOT DETECTED Final   Metapneumovirus NOT DETECTED NOT DETECTED Final   Rhinovirus / Enterovirus NOT DETECTED NOT DETECTED Final   Influenza A NOT DETECTED NOT DETECTED Final   Influenza B NOT DETECTED NOT DETECTED Final   Parainfluenza Virus 1 NOT DETECTED NOT DETECTED Final   Parainfluenza Virus 2 NOT DETECTED NOT DETECTED Final   Parainfluenza Virus 3 NOT DETECTED NOT DETECTED Final   Parainfluenza Virus 4 NOT DETECTED NOT DETECTED Final   Respiratory Syncytial Virus NOT DETECTED NOT DETECTED Final   Bordetella pertussis NOT DETECTED NOT DETECTED Final   Chlamydophila pneumoniae NOT DETECTED NOT DETECTED Final   Mycoplasma pneumoniae NOT DETECTED  NOT DETECTED Final    Comment: Performed at [facility], [address] [place], [place] [address]  MRSA PCR Screening     Status: None   Collection Time: 06/24/17 12:48 PM  Result Value Ref Range Status   MRSA by PCR NEGATIVE NEGATIVE Final    Comment:        The GeneXpert MRSA Assay (FDA approved for NASAL specimens only), is one component of a comprehensive MRSA colonization surveillance program. It is not intended to diagnose MRSA infection nor to guide or monitor treatment for MRSA infections. Performed at [facility], [address] [place], [place] [address]      Time coordinating discharge: 35 minutes  SIGNED:  Latrelle DodrillEdwin Silva, MD  [facility] 06/26/2017, 2:24 PM  Pager please text page via  [email or web address]

## 2017-06-26 NOTE — Progress Notes (Signed)
 [facility] Progress Note  Subjective: no further fevers overnight, wants to know if he is going home, no SOB , chills or cough now  Vitals:   06/26/17 0830 06/26/17 0900 06/26/17 0930 06/26/17 1000  BP: (!) 183/106 (!) 169/102 (!) 173/85 (!) 165/91  Pulse: 83 79 77 80  Resp: 16 15 16  (!) 21  Temp:      TempSrc:      SpO2:      Weight:      Height:        Inpatient medications: . amLODipine  10 mg Oral Daily  . cinacalcet  60 mg Oral Daily  . cloNIDine  0.2 mg Oral TID  . heparin  4,600 Units Dialysis Once in dialysis  . [START ON 06/27/2017] heparin  4,600 Units Dialysis Once in dialysis  . heparin  5,000 Units Subcutaneous Q8H  . hydrALAZINE  100 mg Oral Q8H  . isosorbide mononitrate  90 mg Oral Daily  . labetalol  200 mg Oral TID  . sevelamer carbonate  800 mg Oral TID WC   . sodium chloride    . sodium chloride    . sodium chloride    . sodium chloride    . ceFEPime (MAXIPIME) IV Stopped (06/25/17 1100)   sodium chloride, sodium chloride, sodium chloride, sodium chloride, acetaminophen **OR** acetaminophen, alteplase, guaiFENesin, heparin, heparin, heparin, heparin, hydrALAZINE, HYDROcodone-acetaminophen, lidocaine (PF), lidocaine-prilocaine, ondansetron **OR** ondansetron (ZOFRAN) IV, pentafluoroprop-tetrafluoroeth  Exam: General: alert, no distress Resp: clear bilat Cardio: RRR no mrg GI: soft, non-tender; bowel sounds normal Ext: no edema L AVF- accessed for HD    [facility]    84.5kg   K/Ca:2/2.25 bath  L AVF 15g 450/800 P2  Hep 4600 VDRA:hectorol 6mcg qTx no ESA or Fe Treatment Adherence:freq s/o early      Impression: 1  Fever/ SOB - pulm edema resolved , fevers better, PCR/ blood cx's neg 2  ESRD HD tts 3  ^K+ resolved 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 7  Vol overload - looks like chronic issue, attempted to educate pt  8  Dispo - ok for dc from renal standpoint   Plan  - HD today, max UF   Vinson Moselleob Keidy Thurgood MD [facility] pager [phone]   06/26/2017, 10:58 AM   Recent Labs  Lab 06/24/17 1154 06/25/17 0258 06/26/17 0559  NA 141 137 136  K 4.1 4.6 5.0  CL 98* 97* 95*  CO2 25 25 22   GLUCOSE 98 92 91  BUN 34* 48* 64*  CREATININE 12.42*  12.66* 14.79* 17.56*  CALCIUM 9.0 8.0* 8.1*  PHOS 4.5  --   --    Recent Labs  Lab 06/24/17 0130 06/24/17 1154  AST 17  --   ALT 17  --   ALKPHOS 55  --   BILITOT 0.9  --   PROT 7.4  --   ALBUMIN 4.1 3.7   Recent Labs  Lab 06/24/17 1154 06/25/17 0258 06/26/17 0559  WBC 12.3* 10.0 7.2  HGB 10.2* 9.4* 9.4*  HCT 32.2* 30.2* 29.6*  MCV 91.5 92.1 90.5  PLT 210 190 197   Iron/TIBC/Ferritin/ %Sat No results found for: IRON, TIBC, FERRITIN, IRONPCTSAT

## 2017-06-26 NOTE — Care Management Note (Signed)
Case Management Note  Patient Details  Name: Jeremy Huerta MRN: [id number] Date of Birth: March 27, 1996  Subjective/Objective:                    Action/Plan: Pt discharging home with self care.  PcP: Dr Roxan Hockeyobinson Insurance: Medicaid Pt has transportation home. No further needs per CM.   Expected Discharge Date:  06/26/17               Expected Discharge Plan:  Home/Self Care  In-House Referral:     Discharge planning Services     Post Acute Care Choice:    Choice offered to:     DME Arranged:    DME Agency:     HH Arranged:    HH Agency:     Status of Service:  Completed, signed off  If discussed at [employer] Length of Stay Meetings, dates discussed:    Additional Comments:  [name], RN 06/26/2017, 3:11 PM

## 2017-06-26 NOTE — Progress Notes (Signed)
Signed        Pt discharge education and instructions completed with pt and he voices understanding denies any questions. Pt IV and telemetry removed; pt discharge home with family to transport him home. Pt handed his prescription for Augmentin; pt offered wheelchair but he declines and ambulated off unit independently with belongings to the side. Declined for staff to walkout with him as well. Dionne BucyP. Amo Tashina Credit RN

## 2017-06-29 LAB — CULTURE, BLOOD (ROUTINE X 2)
Culture: NO GROWTH
Culture: NO GROWTH
Culture: NO GROWTH
SPECIAL REQUESTS: ADEQUATE
SPECIAL REQUESTS: ADEQUATE
SPECIAL REQUESTS: ADEQUATE

## 2017-09-16 ENCOUNTER — Emergency Department (HOSPITAL_COMMUNITY): Payer: Medicaid Other

## 2017-09-16 ENCOUNTER — Inpatient Hospital Stay (HOSPITAL_COMMUNITY)
Admission: EM | Admit: 2017-09-16 | Discharge: 2017-09-18 | DRG: 077 | Disposition: A | Discharge status: Home / Self Care | Payer: Medicaid Other | Attending: Family Medicine | Admitting: Family Medicine

## 2017-09-16 ENCOUNTER — Encounter (HOSPITAL_COMMUNITY): Payer: Self-pay

## 2017-09-16 ENCOUNTER — Other Ambulatory Visit: Payer: Self-pay

## 2017-09-16 DIAGNOSIS — Z9115 Patient's noncompliance with renal dialysis: Secondary | ICD-10-CM

## 2017-09-16 DIAGNOSIS — I120 Hypertensive chronic kidney disease with stage 5 chronic kidney disease or end stage renal disease: Secondary | ICD-10-CM | POA: Diagnosis present

## 2017-09-16 DIAGNOSIS — I160 Hypertensive urgency: Secondary | ICD-10-CM | POA: Diagnosis not present

## 2017-09-16 DIAGNOSIS — Z992 Dependence on renal dialysis: Secondary | ICD-10-CM | POA: Diagnosis not present

## 2017-09-16 DIAGNOSIS — M545 Low back pain: Secondary | ICD-10-CM

## 2017-09-16 DIAGNOSIS — J189 Pneumonia, unspecified organism: Secondary | ICD-10-CM | POA: Diagnosis present

## 2017-09-16 DIAGNOSIS — I674 Hypertensive encephalopathy: Secondary | ICD-10-CM | POA: Diagnosis present

## 2017-09-16 DIAGNOSIS — E875 Hyperkalemia: Secondary | ICD-10-CM | POA: Diagnosis present

## 2017-09-16 DIAGNOSIS — N2581 Secondary hyperparathyroidism of renal origin: Secondary | ICD-10-CM | POA: Diagnosis present

## 2017-09-16 DIAGNOSIS — M549 Dorsalgia, unspecified: Secondary | ICD-10-CM

## 2017-09-16 DIAGNOSIS — I161 Hypertensive emergency: Secondary | ICD-10-CM | POA: Diagnosis present

## 2017-09-16 DIAGNOSIS — R51 Headache: Secondary | ICD-10-CM | POA: Diagnosis present

## 2017-09-16 DIAGNOSIS — T465X6A Underdosing of other antihypertensive drugs, initial encounter: Secondary | ICD-10-CM | POA: Diagnosis present

## 2017-09-16 DIAGNOSIS — J811 Chronic pulmonary edema: Secondary | ICD-10-CM | POA: Diagnosis present

## 2017-09-16 DIAGNOSIS — R197 Diarrhea, unspecified: Secondary | ICD-10-CM | POA: Diagnosis present

## 2017-09-16 DIAGNOSIS — R29700 NIHSS score 0: Secondary | ICD-10-CM | POA: Diagnosis present

## 2017-09-16 DIAGNOSIS — R109 Unspecified abdominal pain: Secondary | ICD-10-CM | POA: Diagnosis present

## 2017-09-16 DIAGNOSIS — M546 Pain in thoracic spine: Secondary | ICD-10-CM | POA: Diagnosis present

## 2017-09-16 DIAGNOSIS — E8770 Fluid overload, unspecified: Secondary | ICD-10-CM | POA: Diagnosis present

## 2017-09-16 DIAGNOSIS — E8889 Other specified metabolic disorders: Secondary | ICD-10-CM | POA: Diagnosis present

## 2017-09-16 DIAGNOSIS — N186 End stage renal disease: Secondary | ICD-10-CM

## 2017-09-16 DIAGNOSIS — Y95 Nosocomial condition: Secondary | ICD-10-CM | POA: Diagnosis present

## 2017-09-16 DIAGNOSIS — D631 Anemia in chronic kidney disease: Secondary | ICD-10-CM | POA: Diagnosis present

## 2017-09-16 DIAGNOSIS — R402413 Glasgow coma scale score 13-15, at hospital admission: Secondary | ICD-10-CM | POA: Diagnosis present

## 2017-09-16 DIAGNOSIS — R0602 Shortness of breath: Secondary | ICD-10-CM

## 2017-09-16 DIAGNOSIS — R9431 Abnormal electrocardiogram [ECG] [EKG]: Secondary | ICD-10-CM | POA: Diagnosis present

## 2017-09-16 DIAGNOSIS — I10 Essential (primary) hypertension: Secondary | ICD-10-CM | POA: Diagnosis present

## 2017-09-16 DIAGNOSIS — R55 Syncope and collapse: Secondary | ICD-10-CM | POA: Diagnosis present

## 2017-09-16 DIAGNOSIS — Z79899 Other long term (current) drug therapy: Secondary | ICD-10-CM

## 2017-09-16 DIAGNOSIS — R410 Disorientation, unspecified: Secondary | ICD-10-CM

## 2017-09-16 LAB — CBC WITH DIFFERENTIAL/PLATELET
Basophils Absolute: 0.1 10*3/uL (ref 0.0–0.1)
Basophils Relative: 1 %
EOS ABS: 0.1 10*3/uL (ref 0.0–0.7)
EOS PCT: 1 %
HCT: 35.7 % — ABNORMAL LOW (ref 39.0–52.0)
Hemoglobin: 11.7 g/dL — ABNORMAL LOW (ref 13.0–17.0)
Lymphocytes Relative: 11 %
Lymphs Abs: 1.2 10*3/uL (ref 0.7–4.0)
MCH: 29.5 pg (ref 26.0–34.0)
MCHC: 32.8 g/dL (ref 30.0–36.0)
MCV: 89.9 fL (ref 78.0–100.0)
MONO ABS: 0.7 10*3/uL (ref 0.1–1.0)
MONOS PCT: 6 %
Neutro Abs: 8.7 10*3/uL — ABNORMAL HIGH (ref 1.7–7.7)
Neutrophils Relative %: 81 %
Platelets: 183 10*3/uL (ref 150–400)
RBC: 3.97 MIL/uL — ABNORMAL LOW (ref 4.22–5.81)
RDW: 15.3 % (ref 11.5–15.5)
WBC: 10.8 10*3/uL — ABNORMAL HIGH (ref 4.0–10.5)

## 2017-09-16 LAB — I-STAT CHEM 8, ED
BUN: 105 mg/dL — ABNORMAL HIGH (ref 6–20)
Calcium, Ion: 1.09 mmol/L — ABNORMAL LOW (ref 1.15–1.40)
Chloride: 110 mmol/L (ref 101–111)
Creatinine, Ser: >18 mg/dL — ABNORMAL HIGH (ref 0.61–1.24)
Glucose, Bld: 68 mg/dL (ref 65–99)
HCT: 39 % (ref 39.0–52.0)
HEMOGLOBIN: 13.3 g/dL (ref 13.0–17.0)
Potassium: 5.4 mmol/L — ABNORMAL HIGH (ref 3.5–5.1)
SODIUM: 137 mmol/L (ref 135–145)
TCO2: 16 mmol/L — AB (ref 22–32)

## 2017-09-16 LAB — BASIC METABOLIC PANEL
Anion gap: 17 — ABNORMAL HIGH (ref 5–15)
BUN: 112 mg/dL — ABNORMAL HIGH (ref 6–20)
CALCIUM: 8.7 mg/dL — AB (ref 8.9–10.3)
CO2: 15 mmol/L — AB (ref 22–32)
CREATININE: 24.85 mg/dL — AB (ref 0.61–1.24)
Chloride: 104 mmol/L (ref 101–111)
GFR calc non Af Amer: 2 mL/min — ABNORMAL LOW (ref >60)
GFR, EST AFRICAN AMERICAN: 2 mL/min — AB (ref >60)
GLUCOSE: 75 mg/dL (ref 65–99)
Potassium: 5.5 mmol/L — ABNORMAL HIGH (ref 3.5–5.1)
Sodium: 136 mmol/L (ref 135–145)

## 2017-09-16 LAB — I-STAT TROPONIN, ED: Troponin i, poc: 0.04 ng/mL (ref 0.00–0.08)

## 2017-09-16 LAB — CBG MONITORING, ED: Glucose-Capillary: 76 mg/dL (ref 65–99)

## 2017-09-16 MED ORDER — AMLODIPINE BESYLATE 10 MG PO TABS
10.0000 mg | ORAL_TABLET | Freq: Every day | ORAL | Status: DC
  Administered 2017-09-16 – 2017-09-18 (×3): 10 mg via ORAL
  Filled 2017-09-16: qty 2
  Filled 2017-09-16 (×2): qty 1

## 2017-09-16 MED ORDER — ONDANSETRON HCL 4 MG PO TABS
4.0000 mg | ORAL_TABLET | Freq: Four times a day (QID) | ORAL | Status: DC | PRN
Start: 2017-09-16 — End: 2017-09-18

## 2017-09-16 MED ORDER — HYDRALAZINE HCL 50 MG PO TABS
100.0000 mg | ORAL_TABLET | Freq: Three times a day (TID) | ORAL | Status: DC
  Administered 2017-09-16 – 2017-09-18 (×6): 100 mg via ORAL
  Filled 2017-09-16 (×7): qty 2

## 2017-09-16 MED ORDER — ONDANSETRON HCL 4 MG/2ML IJ SOLN
4.0000 mg | Freq: Four times a day (QID) | INTRAMUSCULAR | Status: DC | PRN
  Administered 2017-09-17 (×2): 4 mg via INTRAVENOUS
  Filled 2017-09-16 (×2): qty 2

## 2017-09-16 MED ORDER — CINACALCET HCL 30 MG PO TABS
60.0000 mg | ORAL_TABLET | Freq: Every day | ORAL | Status: DC
  Administered 2017-09-17: 60 mg via ORAL
  Filled 2017-09-16: qty 2

## 2017-09-16 MED ORDER — SODIUM CHLORIDE 0.9% FLUSH: 3.0000 mL | INTRAVENOUS | Status: DC | PRN

## 2017-09-16 MED ORDER — ALBUTEROL (5 MG/ML) CONTINUOUS INHALATION SOLN
10.0000 mg/h | INHALATION_SOLUTION | RESPIRATORY_TRACT | Status: AC
  Administered 2017-09-16: 10 mg/h via RESPIRATORY_TRACT
  Filled 2017-09-16: qty 20

## 2017-09-16 MED ORDER — SEVELAMER CARBONATE 800 MG PO TABS
800.0000 mg | ORAL_TABLET | ORAL | Status: DC | PRN
  Administered 2017-09-18: 800 mg via ORAL
  Filled 2017-09-16: qty 1

## 2017-09-16 MED ORDER — SEVELAMER CARBONATE 800 MG PO TABS
3200.0000 mg | ORAL_TABLET | Freq: Three times a day (TID) | ORAL | Status: DC
Start: 2017-09-17 — End: 2017-09-18
  Administered 2017-09-17 – 2017-09-18 (×4): 3200 mg via ORAL
  Filled 2017-09-16 (×4): qty 4

## 2017-09-16 MED ORDER — ACETAMINOPHEN 325 MG PO TABS
ORAL_TABLET | ORAL | Status: AC
  Administered 2017-09-16: 650 mg via ORAL
  Filled 2017-09-16: qty 2

## 2017-09-16 MED ORDER — ACETAMINOPHEN 325 MG PO TABS
650.0000 mg | ORAL_TABLET | Freq: Once | ORAL | Status: AC
  Administered 2017-09-16 (×2): 650 mg via ORAL
  Filled 2017-09-16: qty 2

## 2017-09-16 MED ORDER — SODIUM CHLORIDE 0.9% FLUSH
3.0000 mL | Freq: Two times a day (BID) | INTRAVENOUS | Status: DC
  Administered 2017-09-16 – 2017-09-18 (×4): 3 mL via INTRAVENOUS

## 2017-09-16 MED ORDER — ISOSORBIDE MONONITRATE ER 60 MG PO TB24
90.0000 mg | ORAL_TABLET | Freq: Every day | ORAL | Status: DC
  Administered 2017-09-16 – 2017-09-18 (×3): 90 mg via ORAL
  Filled 2017-09-16 (×3): qty 1

## 2017-09-16 MED ORDER — LABETALOL HCL 200 MG PO TABS
200.0000 mg | ORAL_TABLET | Freq: Three times a day (TID) | ORAL | Status: DC
  Administered 2017-09-16 – 2017-09-18 (×6): 200 mg via ORAL
  Filled 2017-09-16 (×8): qty 1

## 2017-09-16 MED ORDER — DIPHENHYDRAMINE HCL 50 MG/ML IJ SOLN
25.0000 mg | Freq: Once | INTRAMUSCULAR | Status: AC
  Administered 2017-09-16: 25 mg via INTRAVENOUS
  Filled 2017-09-16: qty 1

## 2017-09-16 MED ORDER — PROCHLORPERAZINE EDISYLATE 10 MG/2ML IJ SOLN
10.0000 mg | Freq: Once | INTRAMUSCULAR | Status: AC
  Administered 2017-09-16: 10 mg via INTRAVENOUS
  Filled 2017-09-16: qty 2

## 2017-09-16 MED ORDER — HYDROCODONE-ACETAMINOPHEN 5-325 MG PO TABS
1.0000 | ORAL_TABLET | Freq: Four times a day (QID) | ORAL | Status: DC | PRN
  Administered 2017-09-17 (×4): 2 via ORAL
  Filled 2017-09-16 (×4): qty 2

## 2017-09-16 MED ORDER — RENA-VITE PO TABS
1.0000 | ORAL_TABLET | Freq: Every day | ORAL | Status: DC
  Administered 2017-09-16 – 2017-09-17 (×2): 1 via ORAL
  Filled 2017-09-16 (×2): qty 1

## 2017-09-16 MED ORDER — SODIUM CHLORIDE 0.9 % IV SOLN: 250.0000 mL | INTRAVENOUS | Status: DC | PRN

## 2017-09-16 MED ORDER — CLONIDINE HCL 0.2 MG PO TABS
0.2000 mg | ORAL_TABLET | Freq: Three times a day (TID) | ORAL | Status: DC
  Administered 2017-09-16 – 2017-09-18 (×6): 0.2 mg via ORAL
  Filled 2017-09-16 (×7): qty 1

## 2017-09-16 NOTE — ED Provider Notes (Signed)
[facility] EMERGENCY DEPARTMENT Provider Note   CSN: [id number] Arrival date & time: 09/16/17  0901     History   Chief Complaint Chief Complaint  Patient presents with  . Loss of Consciousness    HPI Jeremy Huerta is a 22 y.o. male with a past medical history of FSGS, end-stage renal disease on hemodialysis Tuesday Thursday Saturday, hypertension who presents the emergency department via EMS for syncopal event.  The patient last dialyzed 5 days ago on Thursday, 25 April.  Patient was in the waiting room at hemodialysis this morning when he had an episode of syncope.  He is notably very hypertensive and is complaining of severe headache which was not present prior to his syncopal event. He aslo c/o mid back pain. He denies cp/ sob or cough.   HPI  Past Medical History:  Diagnosis Date  . Acute respiratory failure (HCC)   . ESRD (end stage renal disease) (HCC)    TTS  . FSGS (focal segmental glomerulosclerosis)   . Hypertension   . Nausea & vomiting 11/2016  . Pneumonia 06/24/2017    Patient Active Problem List   Diagnosis Date Noted  . Syncope 09/16/2017  . HAP (hospital-acquired pneumonia) 06/24/2017  . Sepsis (HCC) 06/24/2017  . Abdominal pain 06/24/2017  . Vomiting 04/29/2017  . Skin abscess 03/07/2017  . 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 Back pain 03/05/2017  . Hypertensive emergency 01/13/2017  . History of anemia due to chronic kidney disease 01/13/2017  . Secondary hyperparathyroidism (HCC) 01/13/2017  . Bleeding from the nose 01/13/2017  . Fever 01/13/2017  . Chest pain on breathing   . SOB (shortness of breath)   . Gastroesophageal reflux disease   . Respiratory failure (HCC) 12/24/2016  . Acute pulmonary edema (HCC)   . End-stage renal disease on hemodialysis (HCC)   . Hypertensive urgency 12/02/2016  . Leukocytosis 12/02/2016  . Nausea & vomiting 12/02/2016  . Hyperkalemia 12/02/2016  . Acute respiratory failure (HCC)  04/22/2016  . Diarrhea 04/22/2016  . HCAP (healthcare-associated pneumonia) 04/22/2016  . Tachycardia 04/22/2016  . Hypertension     Past Surgical History:  Procedure Laterality Date  . AV FISTULA PLACEMENT  12/2015  . RENAL BIOPSY  2013        Home Medications    Prior to Admission medications   Medication Sig Start Date End Date Taking? Authorizing Provider  amLODipine (NORVASC) 10 MG tablet Take 1 tablet (10 mg total) daily by mouth. 04/04/17  Yes Ollis, Brandi L, NP  B Complex-C-Folic Acid (B COMPLEX-VITAMIN C-FOLIC ACID) 1 MG tablet Take 1 tablet by mouth daily. 04/30/16  Yes [provider]  cinacalcet (SENSIPAR) 30 MG tablet Take 2 tablets (60 mg total) by mouth daily with supper. Patient taking differently: Take 60 mg by mouth daily.  12/04/16  Yes Rolly Salter, MD  cloNIDine (CATAPRES) 0.2 MG tablet Take 1 tablet (0.2 mg total) by mouth 3 (three) times daily. 04/30/17  Yes Scherrie Gerlach, MD  fluticasone [facility]) 50 MCG/ACT nasal spray Place 1 spray daily into both nostrils. Patient taking differently: Place 1 spray into both nostrils daily as needed for allergies or rhinitis.  04/05/17  Yes Ollis, Brandi L, NP  hydrALAZINE (APRESOLINE) 100 MG tablet Take 100 mg by mouth 3 (three) times daily.  04/13/16  Yes [provider]  labetalol (NORMODYNE) 200 MG tablet Take 200 mg by mouth 3 (three) times daily.  03/16/16  Yes [provider]  ondansetron (  ZOFRAN ODT) 4 MG disintegrating tablet Take 1 tablet (4 mg total) by mouth every 8 (eight) hours as needed for nausea or vomiting. 05/06/17  Yes Horton, Mayer Masker, MD  sevelamer (RENAGEL) 800 MG tablet Take 800-3,200 mg by mouth See admin instructions. 2,400-3,200 mg three times a day with meals and 800 mg with each snack   Yes [provider]  HYDROcodone-acetaminophen (NORCO/VICODIN) 5-325 MG tablet Take 1-2 tablets by mouth every 6 (six) hours as needed. Patient not taking: Reported on  09/16/2017 05/06/17   Horton, Mayer Masker, MD  isosorbide mononitrate (IMDUR) 30 MG 24 hr tablet Take 3 tablets (90 mg total) daily by mouth. Patient not taking: Reported on 09/16/2017 04/04/17   Jeanella Craze, NP    Family History Family History  Problem Relation Age of Onset  . Hypertension Mother   . Diabetes Father   . Hypertension Father   . Kidney disease Father   . Hypertension Maternal Grandmother   . Diabetes Paternal Grandmother   . Hypertension Paternal Grandmother     Social History Social History   Tobacco Use  . Smoking status: Never Smoker  . Smokeless tobacco: Never Used  Substance Use Topics  . Alcohol use: No  . Drug use: No     Allergies   Patient has no known allergies.   Review of Systems Review of Systems Ten systems reviewed and are negative for acute change, except as noted in the HPI.    Physical Exam Updated Vital Signs BP (!) 207/133   Pulse 95   Resp (!) 38   SpO2 98%   Physical Exam  Constitutional: He is oriented to person, place, and time. He appears well-developed and well-nourished. No distress.  HENT:  Head: Normocephalic and atraumatic.  Mouth/Throat: Oropharynx is clear and moist.  Eyes: Pupils are equal, round, and reactive to light. Conjunctivae and EOM are normal. No scleral icterus.  No horizontal, vertical or rotational nystagmus  Neck: Normal range of motion. Neck supple.  Full active and passive ROM without pain No midline or paraspinal tenderness No nuchal rigidity or meningeal signs  Cardiovascular: Normal rate, regular rhythm, normal heart sounds and intact distal pulses.  Pulmonary/Chest: Effort normal and breath sounds normal. No respiratory distress. He has no wheezes. He has no rales.  Abdominal: Soft. Bowel sounds are normal. There is no tenderness. There is no rebound and no guarding.  Musculoskeletal: Normal range of motion. He exhibits no edema.  Tender midline thoracic  Lymphadenopathy:    He has no  cervical adenopathy.  Neurological: He is alert and oriented to person, place, and time. No cranial nerve deficit. He exhibits normal muscle tone. Coordination normal.  Mental Status:  Alert, oriented, thought content appropriate. Speech fluent without evidence of aphasia. Able to follow 2 step commands without difficulty.  Cranial Nerves:  II:  Peripheral visual fields grossly normal, pupils equal, round, reactive to light III,IV, VI: ptosis not present, extra-ocular motions intact bilaterally  V,VII: smile symmetric, facial light touch sensation equal VIII: hearing grossly normal bilaterally  IX,X: midline uvula rise  XI: bilateral shoulder shrug equal and strong XII: midline tongue extension  Motor:  5/5 in upper and lower extremities bilaterally including strong and equal grip strength and dorsiflexion/plantar flexion Sensory:  Light touch normal in all extremities.  Cerebellar: normal finger-to-nose with bilateral upper extremities Gait:deferred CV: distal pulses palpable throughout   Skin: Skin is warm and dry. Capillary refill takes less than 2 seconds. No rash noted. He  is not diaphoretic.  Psychiatric: He has a normal mood and affect. His behavior is normal. Judgment and thought content normal.  Nursing note and vitals reviewed.    ED Treatments / Results  Labs (all labs ordered are listed, but only abnormal results are displayed) Labs Reviewed  BASIC METABOLIC PANEL - Abnormal; Notable for the following components:      Result Value   Potassium 5.5 (*)    CO2 15 (*)    BUN 112 (*)    Creatinine, Ser 24.85 (*)    Calcium 8.7 (*)    GFR calc non Af Amer 2 (*)    GFR calc Af Amer 2 (*)    Anion gap 17 (*)    All other components within normal limits  CBC WITH DIFFERENTIAL/PLATELET - Abnormal; Notable for the following components:   WBC 10.8 (*)    RBC 3.97 (*)    Hemoglobin 11.7 (*)    HCT 35.7 (*)    Neutro Abs 8.7 (*)    All other components within normal limits    I-STAT CHEM 8, ED - Abnormal; Notable for the following components:   Potassium 5.4 (*)    BUN 105 (*)    Creatinine, Ser >18.00 (*)    Calcium, Ion 1.09 (*)    TCO2 16 (*)    All other components within normal limits  CBG MONITORING, ED  I-STAT TROPONIN, ED    EKG EKG Interpretation  Date/Time:  Tuesday September 16 2017 09:14:02 EDT Ventricular Rate:  95 PR Interval:    QRS Duration: 94 QT Interval:  376 QTC Calculation: 473 R Axis:   82 Text Interpretation:  Sinus rhythm Low voltage, precordial leads Borderline T abnormalities, lateral leads Borderline prolonged QT interval similar to previous Confirmed by Frederick Peers [phone]) on 09/16/2017 9:41:26 AM   Radiology Dg Chest 1 View  Result Date: 09/16/2017 CLINICAL DATA:  Syncope EXAM: CHEST  1 VIEW COMPARISON:  June 24, 2017 FINDINGS: There is a focal area of airspace consolidation in the lateral left base. No similar changes elsewhere. There is mild cardiomegaly with pulmonary venous hypertension. No adenopathy. No bone lesions. IMPRESSION: Focal area of airspace consolidation consistent with pneumonia lateral left base. Pulmonary vascular congestion.  No adenopathy evident. Electronically Signed   By: Bretta Bang III M.D.   On: 09/16/2017 09:57   Dg Thoracic Spine 2 View  Result Date: 09/16/2017 CLINICAL DATA:  Syncopal episode today with resultant fall. Patient complains of neck and upper back pain. EXAM: THORACIC SPINE 2 VIEWS COMPARISON:  PA and lateral chest x-ray of June 24, 2017 FINDINGS: The thoracic vertebral bodies are preserved in height. The disc space heights are well maintained. The pedicles are intact. There are no abnormal paravertebral soft tissue densities. IMPRESSION: There is no acute or significant chronic bony abnormality of the thoracic spine. Electronically Signed   By: David  [place] M.D.   On: 09/16/2017 11:53   Ct Head Wo Contrast  Result Date: 09/16/2017 CLINICAL DATA:  Syncope.  Renal  failure EXAM: CT HEAD WITHOUT CONTRAST TECHNIQUE: Contiguous axial images were obtained from the base of the skull through the vertex without intravenous contrast. COMPARISON:  April 22, 2017 FINDINGS: Brain: The ventricles are normal in size and configuration. There is no intracranial mass, hemorrhage, extra-axial fluid collection, or midline shift. Gray-white compartments are normal. No acute infarct evident. Vascular: No hyperdense vessel appreciable. There is no appreciable arterial vascular calcification. Skull: Bony calvarium appears intact. Sinuses/Orbits: There is mucosal  thickening in several ethmoid air cells. There is slight mucosal thickening in the medial right maxillary antrum. Frontal sinuses are nearly aplastic. Visualized paranasal sinuses elsewhere clear. Orbits appear symmetric bilaterally. Other: Mastoid air cells are clear. IMPRESSION: Mild paranasal sinus disease.  Study otherwise unremarkable. Electronically Signed   By: Bretta Bang III M.D.   On: 09/16/2017 13:15    Procedures Procedures (including critical care time)  Medications Ordered in ED Medications  albuterol (PROVENTIL,VENTOLIN) solution continuous neb (0 mg/hr Nebulization Stopped 09/16/17 1030)  amLODipine (NORVASC) tablet 10 mg (10 mg Oral Given 09/16/17 1315)  cloNIDine (CATAPRES) tablet 0.2 mg (has no administration in time range)  hydrALAZINE (APRESOLINE) tablet 100 mg (has no administration in time range)  labetalol (NORMODYNE) tablet 200 mg (has no administration in time range)  acetaminophen (TYLENOL) tablet 650 mg (650 mg Oral Given 09/16/17 1221)  prochlorperazine (COMPAZINE) injection 10 mg (10 mg Intravenous Given 09/16/17 1316)  diphenhydrAMINE (BENADRYL) injection 25 mg (25 mg Intravenous Given 09/16/17 1316)   CRITICAL CARE Performed by: Arthor Captain Total critical care time: 60 minutes Critical care time was exclusive of separately billable procedures and treating other patients. Critical  care was necessary to treat or prevent imminent or life-threatening deterioration. Critical care was time spent personally by me on the following activities: development of treatment plan with patient and/or surrogate as well as nursing, discussions with consultants, evaluation of patient's response to treatment, examination of patient, obtaining history from patient or surrogate, ordering and performing treatments and interventions, ordering and review of laboratory studies, ordering and review of radiographic studies, pulse oximetry and re-evaluation of patient's condition.   Initial Impression / Assessment and Plan / ED Course  I have reviewed the triage vital signs and the nursing notes.  Pertinent labs & imaging results that were available during my care of the patient were reviewed by me and considered in my medical decision making (see chart for details).  Clinical Course as of Sep 17 1515  Tue Sep 16, 2017  1136 Patient given albuterol as temporizing agent while waiting for lab results.   [AH]  1248 Cxr reviewed, read says Pneumonia, however patient denies cough, shortness of breath or any breathing difficulties.  He does appear to have some pulmonary vascular congestion but no evidence of JVD, no crackles on lung fields. His oxygen sat is normal.  DG Chest 1 View [AH]  1256 I spoke with Dr. Arlean Hopping who does not want the patient discharged. He states that staff called because he was confused and they are concerned for Hypertensive encephalopathy, although currently patient is A&O x 4. Review of med list shows that patient has been out of his clonidine for the past week.  I have ordered his home meds.   [AH]  1328 Patient's breathing is more labored.    [AH]  1343 CT Head Wo Contrast [AH]  1344 DG Thoracic Spine 2 View [AH]    Clinical Course User Index [AH] Arthor Captain, PA-C   She will be dialyzed and admitted to the stepdown unit for hypertensive urgency. I have reviewed labs  and imaging.  His breathing has worsened over the course of his ER visit and he appears to have some volume overload at this point.  Patient is more comfortable sitting up.  He is been giving pain medications.  His symptoms have improved.  No evidence of subarachnoid hemorrhage or other acute intracranial bleed.  Patient appears appropriate for admission at this time.   Final Clinical Impressions(s) /  ED Diagnoses   Final diagnoses:  Hypertensive urgency  Syncope and collapse  Confusion  ESRD (end stage renal disease) on dialysis [facility])  Shortness of breath  Hyperkalemia    ED Discharge Orders    None    Patient   Arthor Captain, PA-C 09/16/17 1518    Little, Ambrose Finland, MD 09/17/17 [phone]

## 2017-09-16 NOTE — ED Triage Notes (Signed)
[facility]- pt coming from HD center. Missed dialysis Saturday and was in the dialysis waiting room today and had a syncopal episode. Pt reports back pain after fall. Pt alert and oriented on arrival.

## 2017-09-16 NOTE — H&P (Addendum)
TRH H&P    Patient Demographics:    Jeremy Huerta, is a 22 y.o. male  MRN: [id number]  DOB - August 12, 1995  Admit Date - 09/16/2017  Referring MD/NP/PA: Arthor Captain  Outpatient Primary MD for the patient is [name], MD  Patient coming from: Home   Chief complaint- Passed out   HPI:    Jeremy Huerta  is a 22 y.o. male, with history of FSGS, end-stage renal disease on hemodialysis Tuesday Thursday and Saturday, hypertension who was brought to the hospital after patient had a syncopal episode at dialysis center.  Patient was dialyzed 5 days ago on Thursday 25th April.  And he missed dialysis on Saturday, due to lack of transportation.  Patient is in the process of moving from [place] to [place].   Patient is currently very somnolent has received diphenhydramine, prochlorperazine the ED.  History obtained from patient's mother at bedside. As per mother he did not have any chest pain or shortness of breath.  He did complain of back pain, also had diarrhea. In the ED patient was found to have creatinine of greater than 18.0, potassium 5.4. Chest x-ray showed vascular congestion and focally left airspace consolidation in the left lateral base.    Review of systems:      All other systems reviewed and are negative.   With Past History of the following :    Past Medical History:  Diagnosis Date  . Acute respiratory failure (HCC)   . ESRD (end stage renal disease) (HCC)    TTS  . FSGS (focal segmental glomerulosclerosis)   . Hypertension   . Nausea & vomiting 11/2016  . Pneumonia 06/24/2017      Past Surgical History:  Procedure Laterality Date  . AV FISTULA PLACEMENT  12/2015  . RENAL BIOPSY  2013      Social History:      Social History   Tobacco Use  . Smoking status: Never Smoker  . Smokeless tobacco: Never Used  Substance Use Topics  . Alcohol use: No        Family History :     Family History  Problem Relation Age of Onset  . Hypertension Mother   . Diabetes Father   . Hypertension Father   . Kidney disease Father   . Hypertension Maternal Grandmother   . Diabetes Paternal Grandmother   . Hypertension Paternal Grandmother       Home Medications:   Prior to Admission medications   Medication Sig Start Date End Date Taking? Authorizing Provider  amLODipine (NORVASC) 10 MG tablet Take 1 tablet (10 mg total) daily by mouth. 04/04/17  Yes Ollis, Brandi L, NP  B Complex-C-Folic Acid (B COMPLEX-VITAMIN C-FOLIC ACID) 1 MG tablet Take 1 tablet by mouth daily. 04/30/16  Yes [provider]  cinacalcet (SENSIPAR) 30 MG tablet Take 2 tablets (60 mg total) by mouth daily with supper. Patient taking differently: Take 60 mg by mouth daily.  12/04/16  Yes Rolly Salter, MD  cloNIDine (CATAPRES) 0.2 MG tablet Take 1 tablet (0.2  mg total) by mouth 3 (three) times daily. 04/30/17  Yes Scherrie Gerlach, MD  fluticasone [facility]) 50 MCG/ACT nasal spray Place 1 spray daily into both nostrils. Patient taking differently: Place 1 spray into both nostrils daily as needed for allergies or rhinitis.  04/05/17  Yes Ollis, Brandi L, NP  hydrALAZINE (APRESOLINE) 100 MG tablet Take 100 mg by mouth 3 (three) times daily.  04/13/16  Yes [provider]  labetalol (NORMODYNE) 200 MG tablet Take 200 mg by mouth 3 (three) times daily.  03/16/16  Yes [provider]  ondansetron (ZOFRAN ODT) 4 MG disintegrating tablet Take 1 tablet (4 mg total) by mouth every 8 (eight) hours as needed for nausea or vomiting. 05/06/17  Yes Horton, Mayer Masker, MD  sevelamer (RENAGEL) 800 MG tablet Take 800-3,200 mg by mouth See admin instructions. 2,400-3,200 mg three times a day with meals and 800 mg with each snack   Yes [provider]  HYDROcodone-acetaminophen (NORCO/VICODIN) 5-325 MG tablet Take 1-2 tablets by mouth every 6 (six) hours as  needed. Patient not taking: Reported on 09/16/2017 05/06/17   Horton, Mayer Masker, MD  isosorbide mononitrate (IMDUR) 30 MG 24 hr tablet Take 3 tablets (90 mg total) daily by mouth. Patient not taking: Reported on 09/16/2017 04/04/17   Jeanella Craze, NP     Allergies:    No Known Allergies   Physical Exam:   Vitals  Blood pressure (!) 207/133, pulse 95, resp. rate (!) 38, SpO2 98 %.  1.  General: Patient is very somnolent, but arousable  2. Psychiatric: Not tested  3. Neurologic: Moving all extremities, patient is somnolent so neurological exam could not be completed.  4. Eyes :  anicteric sclerae, moist conjunctivae with no lid lag. PERRLA.  5. ENMT:  Oropharynx clear with moist mucous membranes and good dentition  6. Neck:  supple, no cervical lymphadenopathy appriciated, No thyromegaly  7. Respiratory : Normal respiratory effort, good air movement bilaterally,clear to  auscultation bilaterally  8. Cardiovascular : RRR, no gallops, rubs or murmurs, no leg edema  9. Gastrointestinal:  Positive bowel sounds, abdomen soft, non-tender to palpation,no hepatosplenomegaly, no rigidity or guarding       10. Skin:  No cyanosis, normal texture and turgor, no rash, lesions or ulcers  11.Musculoskeletal:  Good muscle tone,  joints appear normal , no effusions,  normal range of motion    Data Review:    CBC Recent Labs  Lab 09/16/17 1018 09/16/17 1100  WBC 10.8*  --   HGB 11.7* 13.3  HCT 35.7* 39.0  PLT 183  --   MCV 89.9  --   MCH 29.5  --   MCHC 32.8  --   RDW 15.3  --   LYMPHSABS 1.2  --   MONOABS 0.7  --   EOSABS 0.1  --   BASOSABS 0.1  --    ------------------------------------------------------------------------------------------------------------------  Chemistries  Recent Labs  Lab 09/16/17 1018 09/16/17 1100  NA 136 137  K 5.5* 5.4*  CL 104 110  CO2 15*  --   GLUCOSE 75 68  BUN 112* 105*  CREATININE 24.85* >18.00*  CALCIUM 8.7*  --     ------------------------------------------------------------------------------------------------------------------  ------------------------------------------------------------------------------------------------------------------ GFR: CrCl cannot be calculated (This lab value cannot be used to calculate CrCl because it is not a number: >18.00). Liver Function Tests: No results for input(s): AST, ALT, ALKPHOS, BILITOT, PROT, ALBUMIN in the last 168 hours. No results for input(s): LIPASE, AMYLASE in the last 168 hours.  No results for input(s): AMMONIA in the last 168 hours. Coagulation Profile: No results for input(s): INR, PROTIME in the last 168 hours. Cardiac Enzymes: No results for input(s): CKTOTAL, CKMB, CKMBINDEX, TROPONINI in the last 168 hours. BNP (last 3 results) No results for input(s): PROBNP in the last 8760 hours. HbA1C: No results for input(s): HGBA1C in the last 72 hours. CBG: Recent Labs  Lab 09/16/17 1026  GLUCAP 76   Lipid Profile: No results for input(s): CHOL, HDL, LDLCALC, TRIG, CHOLHDL, LDLDIRECT in the last 72 hours. Thyroid Function Tests: No results for input(s): TSH, T4TOTAL, FREET4, T3FREE, THYROIDAB in the last 72 hours. Anemia Panel: No results for input(s): VITAMINB12, FOLATE, FERRITIN, TIBC, IRON, RETICCTPCT in the last 72 hours.  ---------------------------------------------------------------------------------------------------------------    Imaging Results:    Dg Chest 1 View  Result Date: 09/16/2017 CLINICAL DATA:  Syncope EXAM: CHEST  1 VIEW COMPARISON:  June 24, 2017 FINDINGS: There is a focal area of airspace consolidation in the lateral left base. No similar changes elsewhere. There is mild cardiomegaly with pulmonary venous hypertension. No adenopathy. No bone lesions. IMPRESSION: Focal area of airspace consolidation consistent with pneumonia lateral left base. Pulmonary vascular congestion.  No adenopathy evident. Electronically  Signed   By: Bretta Bang III M.D.   On: 09/16/2017 09:57   Dg Thoracic Spine 2 View  Result Date: 09/16/2017 CLINICAL DATA:  Syncopal episode today with resultant fall. Patient complains of neck and upper back pain. EXAM: THORACIC SPINE 2 VIEWS COMPARISON:  PA and lateral chest x-ray of June 24, 2017 FINDINGS: The thoracic vertebral bodies are preserved in height. The disc space heights are well maintained. The pedicles are intact. There are no abnormal paravertebral soft tissue densities. IMPRESSION: There is no acute or significant chronic bony abnormality of the thoracic spine. Electronically Signed   By: David  Swaziland M.D.   On: 09/16/2017 11:53   Ct Head Wo Contrast  Result Date: 09/16/2017 CLINICAL DATA:  Syncope.  Renal failure EXAM: CT HEAD WITHOUT CONTRAST TECHNIQUE: Contiguous axial images were obtained from the base of the skull through the vertex without intravenous contrast. COMPARISON:  April 22, 2017 FINDINGS: Brain: The ventricles are normal in size and configuration. There is no intracranial mass, hemorrhage, extra-axial fluid collection, or midline shift. Gray-white compartments are normal. No acute infarct evident. Vascular: No hyperdense vessel appreciable. There is no appreciable arterial vascular calcification. Skull: Bony calvarium appears intact. Sinuses/Orbits: There is mucosal thickening in several ethmoid air cells. There is slight mucosal thickening in the medial right maxillary antrum. Frontal sinuses are nearly aplastic. Visualized paranasal sinuses elsewhere clear. Orbits appear symmetric bilaterally. Other: Mastoid air cells are clear. IMPRESSION: Mild paranasal sinus disease.  Study otherwise unremarkable. Electronically Signed   By: Bretta Bang III M.D.   On: 09/16/2017 13:15    My personal review of EKG: Rhythm NSR, nonspecific T wave abnormalities.   Assessment & Plan:    Active Problems:   Syncope   1. Syncope-unclear etiology, patient does  have hypertension and ran out of clonidine few days ago.  He was found to be hypertensive urgency and likely hypertensive encephalopathy contributing to patient's symptoms.  Will monitor patient closely in stepdown unit.  He does have abnormal EKG with nonspecific T wave abnormalities.  Will obtain serial troponin every 6 hours x3. 2. Hypertensive urgency-patient ran out of clonidine a week ago and has not taken it.  Usually takes 3 tablets a day.  His home medications have been restarted and  patient received clonidine in the ED. 3. End-stage renal disease-secondary to FSGS, nephrology has been consulted.  Patient to undergo hemodialysis today. 4. Pulmonary edema-patient's chest x-ray showed pulmonary vascular congestion, also possible infiltrate in left lower lobe.  Doubt patient has pneumonia at this time.  Will not initiate antibiotics.  Hopefully chest x-ray should clear after hemodialysis. 5. Back pain-patient committed back pain, x-ray of the thoracic spine showed no acute on chronic bony abnormality.  We will continue to monitor.    DVT Prophylaxis-   Heparin  AM Labs Ordered, also please review Full Orders  Family Communication: Admission, patients condition and plan of care including tests being ordered have been discussed with the patient and his mother at bedside who indicate understanding and agree with the plan and Code Status.  Code Status:  Full code   Admission status: Inpatient    Time spent in minutes : 60 min   Meredeth Ide M.D on 09/16/2017 at 2:24 PM  Between 7am to 7pm - Pager - [phone]. After 7pm go to [email or web address] - password [facility]  [facility] - Office  [phone]

## 2017-09-16 NOTE — Procedures (Signed)
   I was present at this dialysis session, have reviewed the session itself and made  appropriate changes Vinson Moselle MD [facility] pager [phone]   09/16/2017, 4:07 PM

## 2017-09-16 NOTE — ED Notes (Addendum)
Security called to room due to increasing pt agitation level. CN at bedside; pt using profanity.

## 2017-09-16 NOTE — Consult Note (Addendum)
[facility] Renal Consultation Note    Indication for Consultation:  Management of ESRD/hemodialysis; anemia, hypertension/volume and secondary hyperparathyroidism PCP:  HPI: Jeremy Huerta is a 22 y.o. male with ESRD 2/2 FSGS on HD (TTS [facility]), severe HTN, recurrent abdominal pain.   Had syncopal episode at dialysis center this morning. Staff found him poorly responsive in the lobby and EMS called. Severely hypertensive in ED BP 207/133. He remembers going to dialysis then arriving to the hospital. Feels bad with HA, back and abdominal pain.  Labs significant for K 5.4, Cr >18, BUN 105, CO2 15. Head CT neg. CXR with pulmonary vascular congestion, consolidation in L base. Thoracic spine xray neg. Asked to see for dialysis. He will be admitted for HTN urgency.   Last dialysis was Thursday for 2h . He missed Saturday because he says [facility] did not pick him up. He says he has been having problems with scheduling [facility] pickup. He does have history of variable compliance with dialysis treatment.     Past Medical History:  Diagnosis Date  . Acute respiratory failure (HCC)   . ESRD (end stage renal disease) (HCC)    TTS  . FSGS (focal segmental glomerulosclerosis)   . Hypertension   . Nausea & vomiting 11/2016  . Pneumonia 06/24/2017   Past Surgical History:  Procedure Laterality Date  . AV FISTULA PLACEMENT  12/2015  . RENAL BIOPSY  2013   Family History  Problem Relation Age of Onset  . Hypertension Mother   . Diabetes Father   . Hypertension Father   . Kidney disease Father   . Hypertension Maternal Grandmother   . Diabetes Paternal Grandmother   . Hypertension Paternal Grandmother    Social History:  reports that he has never smoked. He has never used smokeless tobacco. He reports that he does not drink alcohol or use drugs. No Known Allergies Prior to Admission medications   Medication Sig Start Date End Date Taking? Authorizing  Provider  amLODipine (NORVASC) 10 MG tablet Take 1 tablet (10 mg total) daily by mouth. 04/04/17  Yes Ollis, Brandi L, NP  B Complex-C-Folic Acid (B COMPLEX-VITAMIN C-FOLIC ACID) 1 MG tablet Take 1 tablet by mouth daily. 04/30/16  Yes [provider]  cinacalcet (SENSIPAR) 30 MG tablet Take 2 tablets (60 mg total) by mouth daily with supper. Patient taking differently: Take 60 mg by mouth daily.  12/04/16  Yes Rolly Salter, MD  cloNIDine (CATAPRES) 0.2 MG tablet Take 1 tablet (0.2 mg total) by mouth 3 (three) times daily. 04/30/17  Yes Scherrie Gerlach, MD  fluticasone [facility]) 50 MCG/ACT nasal spray Place 1 spray daily into both nostrils. Patient taking differently: Place 1 spray into both nostrils daily as needed for allergies or rhinitis.  04/05/17  Yes Ollis, Brandi L, NP  hydrALAZINE (APRESOLINE) 100 MG tablet Take 100 mg by mouth 3 (three) times daily.  04/13/16  Yes [provider]  labetalol (NORMODYNE) 200 MG tablet Take 200 mg by mouth 3 (three) times daily.  03/16/16  Yes [provider]  ondansetron (ZOFRAN ODT) 4 MG disintegrating tablet Take 1 tablet (4 mg total) by mouth every 8 (eight) hours as needed for nausea or vomiting. 05/06/17  Yes Horton, Mayer Masker, MD  sevelamer (RENAGEL) 800 MG tablet Take 800-3,200 mg by mouth See admin instructions. 2,400-3,200 mg three times a day with meals and 800 mg with each snack   Yes [provider]  HYDROcodone-acetaminophen (NORCO/VICODIN) 5-325 MG tablet  Take 1-2 tablets by mouth every 6 (six) hours as needed. Patient not taking: Reported on 09/16/2017 05/06/17   Horton, Mayer Masker, MD  isosorbide mononitrate (IMDUR) 30 MG 24 hr tablet Take 3 tablets (90 mg total) daily by mouth. Patient not taking: Reported on 09/16/2017 04/04/17   Jeanella Craze, NP   Current Facility-Administered Medications  Medication Dose Route Frequency Provider Last Rate Last Dose  . amLODipine (NORVASC) tablet 10 mg  10 mg Oral  Daily Harris, Abigail, PA-C   10 mg at 09/16/17 1315  . cloNIDine (CATAPRES) tablet 0.2 mg  0.2 mg Oral TID Arthor Captain, PA-C      . hydrALAZINE (APRESOLINE) tablet 100 mg  100 mg Oral TID Harris, Abigail, PA-C      . labetalol (NORMODYNE) tablet 200 mg  200 mg Oral TID Arthor Captain, PA-C       Current Outpatient Medications  Medication Sig Dispense Refill  . amLODipine (NORVASC) 10 MG tablet Take 1 tablet (10 mg total) daily by mouth. 30 tablet 1  . B Complex-C-Folic Acid (B COMPLEX-VITAMIN C-FOLIC ACID) 1 MG tablet Take 1 tablet by mouth daily.    . cinacalcet (SENSIPAR) 30 MG tablet Take 2 tablets (60 mg total) by mouth daily with supper. (Patient taking differently: Take 60 mg by mouth daily. ) 60 tablet 0  . cloNIDine (CATAPRES) 0.2 MG tablet Take 1 tablet (0.2 mg total) by mouth 3 (three) times daily. 90 tablet 0  . fluticasone (FLONASE) 50 MCG/ACT nasal spray Place 1 spray daily into both nostrils. (Patient taking differently: Place 1 spray into both nostrils daily as needed for allergies or rhinitis. ) 16 g 2  . hydrALAZINE (APRESOLINE) 100 MG tablet Take 100 mg by mouth 3 (three) times daily.   5  . labetalol (NORMODYNE) 200 MG tablet Take 200 mg by mouth 3 (three) times daily.     . ondansetron (ZOFRAN ODT) 4 MG disintegrating tablet Take 1 tablet (4 mg total) by mouth every 8 (eight) hours as needed for nausea or vomiting. 20 tablet 0  . sevelamer (RENAGEL) 800 MG tablet Take 800-3,200 mg by mouth See admin instructions. 2,400-3,200 mg three times a day with meals and 800 mg with each snack    . HYDROcodone-acetaminophen (NORCO/VICODIN) 5-325 MG tablet Take 1-2 tablets by mouth every 6 (six) hours as needed. (Patient not taking: Reported on 09/16/2017) 10 tablet 0  . isosorbide mononitrate (IMDUR) 30 MG 24 hr tablet Take 3 tablets (90 mg total) daily by mouth. (Patient not taking: Reported on 09/16/2017) 30 tablet 1    ROS: As per HPI otherwise negative.  Physical Exam: Vitals:    09/16/17 1245 09/16/17 1315 09/16/17 1319 09/16/17 1330  BP:   (!) 199/120 (!) 207/133  Pulse: 93 88 (!) 103 95  Resp: 20 18 (!) 23 (!) 38  SpO2: 100% 100% 100% 98%     General: WDWN male, restless NAD  Head: NCAT sclera not icteric MMM Neck: Supple. No JVD No masses Lungs: CTA bilaterally without wheezes, rales, or rhonchi. Breathing is unlabored. Heart: RRR with S1 S2 Abdomen: soft NT + BS Lower extremities:without edema or ischemic changes, no open wounds  Neuro: A & O  X 3. Moves all extremities spontaneously. Psych:  Normal affect  Dialysis Access: LUE AVF +bruit   Labs: Basic Metabolic Panel: Recent Labs  Lab 09/16/17 1018 09/16/17 1100  NA 136 137  K 5.5* 5.4*  CL 104 110  CO2 15*  --  GLUCOSE 75 68  BUN 112* 105*  CREATININE 24.85* >18.00*  CALCIUM 8.7*  --    Liver Function Tests: No results for input(s): AST, ALT, ALKPHOS, BILITOT, PROT, ALBUMIN in the last 168 hours. No results for input(s): LIPASE, AMYLASE in the last 168 hours. No results for input(s): AMMONIA in the last 168 hours. CBC: Recent Labs  Lab 09/16/17 1018 09/16/17 1100  WBC 10.8*  --   NEUTROABS 8.7*  --   HGB 11.7* 13.3  HCT 35.7* 39.0  MCV 89.9  --   PLT 183  --    Cardiac Enzymes: No results for input(s): CKTOTAL, CKMB, CKMBINDEX, TROPONINI in the last 168 hours. CBG: Recent Labs  Lab 09/16/17 1026  GLUCAP 76   Iron Studies: No results for input(s): IRON, TIBC, TRANSFERRIN, FERRITIN in the last 72 hours. Studies/Results: Dg Chest 1 View  Result Date: 09/16/2017 CLINICAL DATA:  Syncope EXAM: CHEST  1 VIEW COMPARISON:  June 24, 2017 FINDINGS: There is a focal area of airspace consolidation in the lateral left base. No similar changes elsewhere. There is mild cardiomegaly with pulmonary venous hypertension. No adenopathy. No bone lesions. IMPRESSION: Focal area of airspace consolidation consistent with pneumonia lateral left base. Pulmonary vascular congestion.  No  adenopathy evident. Electronically Signed   By: Bretta Bang III M.D.   On: 09/16/2017 09:57   Dg Thoracic Spine 2 View  Result Date: 09/16/2017 CLINICAL DATA:  Syncopal episode today with resultant fall. Patient complains of neck and upper back pain. EXAM: THORACIC SPINE 2 VIEWS COMPARISON:  PA and lateral chest x-ray of June 24, 2017 FINDINGS: The thoracic vertebral bodies are preserved in height. The disc space heights are well maintained. The pedicles are intact. There are no abnormal paravertebral soft tissue densities. IMPRESSION: There is no acute or significant chronic bony abnormality of the thoracic spine. Electronically Signed   By: David  [place]aziland M.D.   On: 09/16/2017 11:53   Ct Head Wo Contrast  Result Date: 09/16/2017 CLINICAL DATA:  Syncope.  Renal failure EXAM: CT HEAD WITHOUT CONTRAST TECHNIQUE: Contiguous axial images were obtained from the base of the skull through the vertex without intravenous contrast. COMPARISON:  April 22, 2017 FINDINGS: Brain: The ventricles are normal in size and configuration. There is no intracranial mass, hemorrhage, extra-axial fluid collection, or midline shift. Gray-white compartments are normal. No acute infarct evident. Vascular: No hyperdense vessel appreciable. There is no appreciable arterial vascular calcification. Skull: Bony calvarium appears intact. Sinuses/Orbits: There is mucosal thickening in several ethmoid air cells. There is slight mucosal thickening in the medial right maxillary antrum. Frontal sinuses are nearly aplastic. Visualized paranasal sinuses elsewhere clear. Orbits appear symmetric bilaterally. Other: Mastoid air cells are clear. IMPRESSION: Mild paranasal sinus disease.  Study otherwise unremarkable. Electronically Signed   By: Bretta Bang III M.D.   On: 09/16/2017 13:15    Dialysis Orders:  NW TTS 4.25h 180NRe 450/800 EDW 87kg 2K/2.25Ca Profile 2 L AVF Hep Bolus 5000 Mid-treatment 3000  Hectorol IV  TIW Mircera IV q 2 weeks (last dosed 4/6)   Assessment/Plan: 1. Hypertensive urgency/Azotemia/Volume overload 2/2 missed dialysis - Plan for HD today for volume/solute removal 2. Syncope - Likely related to #1 No acute findings on Head CT  3. ESRD -  TTS. HD today on schedule and evaluate need for extra HD tomorrow 4. Hypertension/volume  - chronically uncontrolled. UF for volume. Cont home meds. Pulm edema on CXR. UF goal 4L today 5. Anemia  - Hgb  13.3 ESA not indicated  6. Metabolic bone disease -  Renagel 4 qac/Sensipar 90 qd  7. Nonadherent with dialysis - reports transport as the issue.  Have discussed with SW at [facility] He needs to call Medicaid CW to have Medicaid transferred from [place] to [place] and then can register for [employer] transport services  Ogechi Susann Givens PA-C [facility] Pager [phone] 09/16/2017, 2:17 PM   Pt seen, examined, agree w assess/plan as above with additions as indicated. Patient had decreased LOC episode at HD center was found outside on the ground was confused when awoken then sent to ed where he is more oriented. Having bad headaches, per labwork should be uremic with very high bun/ creat and also could have symptomatic malignant hypertension.  Vol overloaded , needs dialysis for solute and vol removal. Otherwise gradually lower bp with po/ IV pain meds as tolerated.  Vinson Moselle MD [employer] pager [phone]    cell [phone] 09/16/2017, 4:05 PM

## 2017-09-16 NOTE — Progress Notes (Signed)
Pt arrive to 4E16 @ 2110 sleepy he stated he want [place] to call his mother but can't remember her cell phone number. Will re assess later.

## 2017-09-17 ENCOUNTER — Other Ambulatory Visit: Payer: Self-pay

## 2017-09-17 ENCOUNTER — Encounter (HOSPITAL_COMMUNITY): Payer: Self-pay

## 2017-09-17 DIAGNOSIS — R55 Syncope and collapse: Secondary | ICD-10-CM

## 2017-09-17 LAB — PROCALCITONIN: PROCALCITONIN: 7.54 ng/mL

## 2017-09-17 LAB — COMPREHENSIVE METABOLIC PANEL
ALT: 16 U/L — AB (ref 17–63)
AST: 17 U/L (ref 15–41)
Albumin: 3.5 g/dL (ref 3.5–5.0)
Alkaline Phosphatase: 76 U/L (ref 38–126)
Anion gap: 17 — ABNORMAL HIGH (ref 5–15)
BILIRUBIN TOTAL: 0.8 mg/dL (ref 0.3–1.2)
BUN: 51 mg/dL — AB (ref 6–20)
CHLORIDE: 95 mmol/L — AB (ref 101–111)
CO2: 27 mmol/L (ref 22–32)
CREATININE: 14.82 mg/dL — AB (ref 0.61–1.24)
Calcium: 9 mg/dL (ref 8.9–10.3)
GFR calc Af Amer: 5 mL/min — ABNORMAL LOW (ref >60)
GFR, EST NON AFRICAN AMERICAN: 4 mL/min — AB (ref >60)
Glucose, Bld: 114 mg/dL — ABNORMAL HIGH (ref 65–99)
Potassium: 3.9 mmol/L (ref 3.5–5.1)
Sodium: 139 mmol/L (ref 135–145)
TOTAL PROTEIN: 6.9 g/dL (ref 6.5–8.1)

## 2017-09-17 LAB — CBC
HEMATOCRIT: 36.6 % — AB (ref 39.0–52.0)
HEMOGLOBIN: 12.1 g/dL — AB (ref 13.0–17.0)
MCH: 29.2 pg (ref 26.0–34.0)
MCHC: 33.1 g/dL (ref 30.0–36.0)
MCV: 88.2 fL (ref 78.0–100.0)
Platelets: 192 10*3/uL (ref 150–400)
RBC: 4.15 MIL/uL — AB (ref 4.22–5.81)
RDW: 15.1 % (ref 11.5–15.5)
WBC: 9.8 10*3/uL (ref 4.0–10.5)

## 2017-09-17 LAB — MRSA PCR SCREENING: MRSA BY PCR: NEGATIVE

## 2017-09-17 LAB — LACTIC ACID, PLASMA
LACTIC ACID, VENOUS: 1.1 mmol/L (ref 0.5–1.9)
Lactic Acid, Venous: 0.4 mmol/L — ABNORMAL LOW (ref 0.5–1.9)

## 2017-09-17 MED ORDER — HYDRALAZINE HCL 20 MG/ML IJ SOLN
5.0000 mg | Freq: Four times a day (QID) | INTRAMUSCULAR | Status: DC | PRN
  Administered 2017-09-17 (×2): 5 mg via INTRAVENOUS
  Filled 2017-09-17 (×2): qty 1

## 2017-09-17 MED ORDER — VANCOMYCIN HCL IN DEXTROSE 1-5 GM/200ML-% IV SOLN
1000.0000 mg | INTRAVENOUS | Status: DC
  Administered 2017-09-18: 1000 mg via INTRAVENOUS
  Filled 2017-09-17: qty 200

## 2017-09-17 MED ORDER — VANCOMYCIN HCL 10 G IV SOLR
2000.0000 mg | Freq: Once | INTRAVENOUS | Status: AC
  Administered 2017-09-17: 2000 mg via INTRAVENOUS
  Filled 2017-09-17: qty 2000

## 2017-09-17 MED ORDER — HEPARIN SODIUM (PORCINE) 5000 UNIT/ML IJ SOLN
5000.0000 [IU] | Freq: Three times a day (TID) | INTRAMUSCULAR | Status: DC
  Administered 2017-09-17 – 2017-09-18 (×4): 5000 [IU] via SUBCUTANEOUS
  Filled 2017-09-17 (×4): qty 1

## 2017-09-17 MED ORDER — PIPERACILLIN-TAZOBACTAM IN DEX 2-0.25 GM/50ML IV SOLN
2.2500 g | Freq: Three times a day (TID) | INTRAVENOUS | Status: DC
  Administered 2017-09-17: 2.25 g via INTRAVENOUS
  Filled 2017-09-17 (×2): qty 50

## 2017-09-17 MED ORDER — PIPERACILLIN-TAZOBACTAM 3.375 G IVPB
3.3750 g | Freq: Two times a day (BID) | INTRAVENOUS | Status: DC
  Administered 2017-09-17 – 2017-09-18 (×2): 3.375 g via INTRAVENOUS
  Filled 2017-09-17 (×3): qty 50

## 2017-09-17 MED ORDER — ACETAMINOPHEN 325 MG PO TABS
650.0000 mg | ORAL_TABLET | Freq: Once | ORAL | Status: AC
  Administered 2017-09-17: 650 mg via ORAL
  Filled 2017-09-17: qty 2

## 2017-09-17 MED ORDER — HYDRALAZINE HCL 20 MG/ML IJ SOLN: 10.0000 mg | INTRAMUSCULAR | Status: DC | PRN

## 2017-09-17 NOTE — Progress Notes (Signed)
Pharmacy Antibiotic Note  Jeremy Huerta is a 22 y.o. male admitted on 09/16/2017 with syncope.  Patient febrile to 101.8 this morning, wbc 9.8, cxr on 4/30 shows some consolidation consistent with pneumonia. Antibiotics were held yesterday but started today by MD. Patient is ESRD and received HD yesterday.   Pharmacy has been consulted for vancomycin dosing.  Plan: Vancomycin 2g IV now then 1g after HD sessions.  Goal trough 15-25  Mcg/mL pre-HD Adjust zosyn to 3.375g IV q12 hours  Height: 6' (182.9 cm) Weight: 195 lb 9.6 oz (88.7 kg) IBW/kg (Calculated) : 77.6  Temp (24hrs), Avg:99.9 F (37.7 C), Min:98.8 F (37.1 C), Max:101.8 F (38.8 C)  Recent Labs  Lab 09/16/17 1018 09/16/17 1100 09/17/17 0245  WBC 10.8*  --  9.8  CREATININE 24.85* >18.00* 14.82*    Estimated Creatinine Clearance: 8.4 mL/min (A) (by C-G formula based on SCr of 14.82 mg/dL (H)).    No Known Allergies  Antimicrobials this admission: Vancomycin 5/1>> Zosyn 5/1>>  Thank you for allowing pharmacy to be a part of this patient's care.  Sheppard Coil PharmD., BCPS Clinical Pharmacist 09/17/2017 10:32 AM

## 2017-09-17 NOTE — Progress Notes (Addendum)
PROGRESS NOTE    Patient: Jeremy Huerta     PCP: [name], MD                    DOB: [birth year]            DOA: 09/16/2017 ZOX:[id number]             DOS: 09/17/2017, 4:21 PM   LOS: 1 day   Date of Service: The patient was seen and examined on 09/17/2017  Subjective:  She was seen and examined this morning, awake alert, complaining of headaches, his blood pressure was still elevated.   ----------------------------------------------------------------------------------------------------------------------  Brief Narrative:    22 y.o. male, with history of FSGS, end-stage renal disease on hemodialysis Tuesday Thursday and Saturday, hypertension who was brought to the hospital after patient had a syncopal episode at dialysis center.  Patient was dialyzed 5 days ago on Thursday 25th April.  And he missed dialysis on Saturday, due to lack of transportation.  Patient is in the process of moving from [place] to [place].   Patient is currently very somnolent has received diphenhydramine, prochlorperazine the ED.  History obtained from patient's mother at bedside. As per mother he did not have any chest pain or shortness of breath.  He did complain of back pain, also had diarrhea. In the ED patient was found to have creatinine of greater than 18.0, potassium 5.4. Chest x-ray showed vascular congestion and focally left airspace consolidation in the left lateral base.      Active Problems:   Syncope   Assessment & Plan:   Syncope -Etiology unclear at this time -On admission he was in hypertensive urgency state on admission, apparently he ran out of his clonidine -He also reported this morning that he was taken muscle relaxant but medical record does not reflect that It might be a contributing factor -Initial EKG revealed nonspecific T wave abnormalities, troponins negative -No syncopal episodes since admission  Hypertensive emergency -Blood pressure improved at  home medication was restarted -Apparently ran out of his medication including clonidine -Please responding to clonidine 0.2 mg p.o. 3 times daily, Norvasc 10 mg daily, hydralazine 100 mg p.o. 3 times daily, Imdur 90 mg daily, labetalol 200 mg p.o. 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  Pneumonia -Her chest x-ray revealed pulmonary vascular congestion, possible infiltrate -T-max 101.8, we obtained 2 sets of blood culture, she does not make any urine, obvious source of infection -we started patient on broad-spectrum antibiotics of Vanco and Zosyn, will be tapered down accordingly  Back pain -X-ray of thoracic spine revealed no acute or chronic abnormalities -Patient reported to nursing staff apparently he was taking muscle relaxant -Which will be held -Might be a contributing factor to his syncopal episode   DVT prophylaxis:   SCDs   Heparin [place]      Code Status:         Full code  Family Communication:  The above findings and plan of care has been discussed with patient  in detail, He expressed understanding and agreement of above. Disposition Plan:  1-2 days,  Home   Consultants:   Nephrology  Procedures:    Hemodialysis  Antimicrobials:  Anti-infectives (From admission, onward)   Start     Dose/Rate Route Frequency Ordered Stop   09/18/17 1200  vancomycin (VANCOCIN) IVPB 1000 mg/200 mL premix     1,000 mg 200 mL/hr over 60 Minutes  Intravenous Every T-Th-Sa (Hemodialysis) 09/17/17 1038     09/17/17 1800  piperacillin-tazobactam (ZOSYN) IVPB 3.375 g     3.375 g 12.5 mL/hr over 240 Minutes Intravenous Every 12 hours 09/17/17 1038     09/17/17 1100  vancomycin (VANCOCIN) 2,000 mg in sodium chloride 0.9 % 500 mL IVPB     2,000 mg 250 mL/hr over 120 Minutes Intravenous  Once 09/17/17 1038 09/17/17 1348   09/17/17 0930  piperacillin-tazobactam (ZOSYN) IVPB 2.25 g  Status:  Discontinued      2.25 g 100 mL/hr over 30 Minutes Intravenous Every 8 hours 09/17/17 0925 09/17/17 1038        Objective: Vitals:   09/17/17 0914 09/17/17 0915 09/17/17 1226 09/17/17 1607  BP: (!) 193/113  (!) 160/97 (!) 164/92  Pulse:  92 82 90  Resp:   18 18  Temp:   98.6 F (37 C) 99.1 F (37.3 C)  TempSrc:   Oral Oral  SpO2:   99% 98%  Weight:      Height:        Intake/Output Summary (Last 24 hours) at 09/17/2017 1621 Last data filed at 09/17/2017 1225 Gross per 24 hour  Intake 243 ml  Output 4512 ml  Net -4269 ml   Filed Weights   09/16/17 1452 09/16/17 1900 09/16/17 2111  Weight: 93.5 kg (206 lb 2.1 oz) 89 kg (196 lb 3.4 oz) 88.7 kg (195 lb 9.6 oz)    Examination:  General exam: Appears calm and comfortable  Psychiatry: Judgement and insight appear normal. Mood & affect appropriate. HEENT: WNLs Respiratory system: Clear to auscultation. Respiratory effort normal. Cardiovascular system: S1 & S2 heard, RRR. No JVD, murmurs, rubs, gallops or clicks. No pedal edema. Gastrointestinal system: Abd. nondistended, soft and nontender. No organomegaly or masses felt. Normal bowel sounds heard. Central nervous system: Alert and oriented. No focal neurological deficits. Extremities: Symmetric 5 x 5 power. Skin: No rashes, lesions or ulcers Wounds:   Data Reviewed: I have personally reviewed following labs and imaging studies  CBC: Recent Labs  Lab 09/16/17 1018 09/16/17 1100 09/17/17 0245  WBC 10.8*  --  9.8  NEUTROABS 8.7*  --   --   HGB 11.7* 13.3 12.1*  HCT 35.7* 39.0 36.6*  MCV 89.9  --  88.2  PLT 183  --  192   Basic Metabolic Panel: Recent Labs  Lab 09/16/17 1018 09/16/17 1100 09/17/17 0245  NA 136 137 139  K 5.5* 5.4* 3.9  CL 104 110 95*  CO2 15*  --  27  GLUCOSE 75 68 114*  BUN 112* 105* 51*  CREATININE 24.85* >18.00* 14.82*  CALCIUM 8.7*  --  9.0   GFR: Estimated Creatinine Clearance: 8.4 mL/min (A) (by C-G formula based on SCr of 14.82 mg/dL (H)). Liver  Function Tests: Recent Labs  Lab 09/17/17 0245  AST 17  ALT 16*  ALKPHOS 76  BILITOT 0.8  PROT 6.9  ALBUMIN 3.5  CBG: Recent Labs  Lab 09/16/17 1026  GLUCAP 76   Sepsis Labs: Recent Labs  Lab 09/17/17 0931 09/17/17 1150  PROCALCITON 7.54  --   LATICACIDVEN 1.1 0.4*    Recent Results (from the past 240 hour(s))  MRSA PCR Screening     Status: None   Collection Time: 09/16/17  9:09 PM  Result Value Ref Range Status   MRSA by PCR NEGATIVE NEGATIVE Final    Comment:        The GeneXpert MRSA Assay (FDA approved for NASAL  specimens only), is one component of a comprehensive MRSA colonization surveillance program. It is not intended to diagnose MRSA infection nor to guide or monitor treatment for MRSA infections. Performed at [facility], [address] [place], [place] [address]       Radiology Studies: Dg Chest 1 View  Result Date: 09/16/2017 CLINICAL DATA:  Syncope EXAM: CHEST  1 VIEW COMPARISON:  June 24, 2017 FINDINGS: There is a focal area of airspace consolidation in the lateral left base. No similar changes elsewhere. There is mild cardiomegaly with pulmonary venous hypertension. No adenopathy. No bone lesions. IMPRESSION: Focal area of airspace consolidation consistent with pneumonia lateral left base. Pulmonary vascular congestion.  No adenopathy evident. Electronically Signed   By: Bretta Bang III M.D.   On: 09/16/2017 09:57   Dg Thoracic Spine 2 View  Result Date: 09/16/2017 CLINICAL DATA:  Syncopal episode today with resultant fall. Patient complains of neck and upper back pain. EXAM: THORACIC SPINE 2 VIEWS COMPARISON:  PA and lateral chest x-ray of June 24, 2017 FINDINGS: The thoracic vertebral bodies are preserved in height. The disc space heights are well maintained. The pedicles are intact. There are no abnormal paravertebral soft tissue densities. IMPRESSION: There is no acute or significant chronic bony abnormality of the thoracic  spine. Electronically Signed   By: David  [place] M.D.   On: 09/16/2017 11:53   Ct Head Wo Contrast  Result Date: 09/16/2017 CLINICAL DATA:  Syncope.  Renal failure EXAM: CT HEAD WITHOUT CONTRAST TECHNIQUE: Contiguous axial images were obtained from the base of the skull through the vertex without intravenous contrast. COMPARISON:  April 22, 2017 FINDINGS: Brain: The ventricles are normal in size and configuration. There is no intracranial mass, hemorrhage, extra-axial fluid collection, or midline shift. Gray-white compartments are normal. No acute infarct evident. Vascular: No hyperdense vessel appreciable. There is no appreciable arterial vascular calcification. Skull: Bony calvarium appears intact. Sinuses/Orbits: There is mucosal thickening in several ethmoid air cells. There is slight mucosal thickening in the medial right maxillary antrum. Frontal sinuses are nearly aplastic. Visualized paranasal sinuses elsewhere clear. Orbits appear symmetric bilaterally. Other: Mastoid air cells are clear. IMPRESSION: Mild paranasal sinus disease.  Study otherwise unremarkable. Electronically Signed   By: Bretta Bang III M.D.   On: 09/16/2017 13:15    Scheduled Meds: . amLODipine  10 mg Oral Daily  . cinacalcet  60 mg Oral Q supper  . cloNIDine  0.2 mg Oral TID  . heparin injection (subcutaneous)  5,000 Units Subcutaneous Q8H  . hydrALAZINE  100 mg Oral TID  . isosorbide mononitrate  90 mg Oral Daily  . labetalol  200 mg Oral TID  . multivitamin  1 tablet Oral QHS  . sevelamer carbonate  3,200 mg Oral TID WC  . sodium chloride flush  3 mL Intravenous Q12H   Continuous Infusions: . sodium chloride    . piperacillin-tazobactam (ZOSYN)  IV    . [START ON 09/18/2017] vancomycin      Time spent: >35 minutes  Kendell Bane, MD [facility],  Pager [phone]  If 7PM-7AM, please contact night-coverage [email or web address]   Password [email or web address]  09/17/2017, 4:22 PM

## 2017-09-17 NOTE — Progress Notes (Signed)
[facility] Progress Note  Subjective: had hd yest w/ 4L off still having headaches this am but sig better overall  Vitals:   09/17/17 0600 09/17/17 0742 09/17/17 0914 09/17/17 0915  BP: (!) 168/92 (!) 181/118 (!) 193/113   Pulse: 90 98  92  Resp:  18    Temp:  98.8 F (37.1 C)    TempSrc:  Oral    SpO2:  100%    Weight:      Height:        Inpatient medications: . amLODipine  10 mg Oral Daily  . cinacalcet  60 mg Oral Q supper  . cloNIDine  0.2 mg Oral TID  . heparin injection (subcutaneous)  5,000 Units Subcutaneous Q8H  . hydrALAZINE  100 mg Oral TID  . isosorbide mononitrate  90 mg Oral Daily  . labetalol  200 mg Oral TID  . multivitamin  1 tablet Oral QHS  . sevelamer carbonate  3,200 mg Oral TID WC  . sodium chloride flush  3 mL Intravenous Q12H   . sodium chloride    . piperacillin-tazobactam (ZOSYN)  IV    . vancomycin    . [START ON 09/18/2017] vancomycin     sodium chloride, hydrALAZINE, HYDROcodone-acetaminophen, ondansetron **OR** ondansetron (ZOFRAN) IV, sevelamer carbonate, sodium chloride flush  Exam:  less puffy in the face more alert and interactive today  no jvd  chest ctab  cor reg no mrg  abd soft ntnd  ext no edema  left arm avf +bruit  Dialysis: nw tss 4h  450/800  87kg  p2  L avf  Heparin 5000 then 3000 midrun - hect 3 - mircera 60 q 2 last 4/6      Impression: 1 LOC episode- due to uremia and uncont htn much better after dialysis last night 2 esrd usual hd is tts, had hd yest here 3 volume is 1-2kg up today 4 uncont hypertension back on home meds bp's coming down 5 fever not sure etiology, did have uri symptoms on admit possibly viral 6 mbd ckd cont meds 7 anemia ckd hb 13 no need for esa 8 dispo stable from renal standpoint, defer to primary  Plan - dialysis tomorrow if still here   Vinson Moselle MD [facility] pager [phone]   09/17/2017, 11:13 AM   Recent Labs  Lab 09/16/17 1018  09/16/17 1100 09/17/17 0245  NA 136 137 139  K 5.5* 5.4* 3.9  CL 104 110 95*  CO2 15*  --  27  GLUCOSE 75 68 114*  BUN 112* 105* 51*  CREATININE 24.85* >18.00* 14.82*  CALCIUM 8.7*  --  9.0   Recent Labs  Lab 09/17/17 0245  AST 17  ALT 16*  ALKPHOS 76  BILITOT 0.8  PROT 6.9  ALBUMIN 3.5   Recent Labs  Lab 09/16/17 1018 09/16/17 1100 09/17/17 0245  WBC 10.8*  --  9.8  NEUTROABS 8.7*  --   --   HGB 11.7* 13.3 12.1*  HCT 35.7* 39.0 36.6*  MCV 89.9  --  88.2  PLT 183  --  192   Iron/TIBC/Ferritin/ %Sat No results found for: IRON, TIBC, FERRITIN, IRONPCTSAT

## 2017-09-17 NOTE — Progress Notes (Signed)
On call notify of high BP plus Temp 101.8 see new orders

## 2017-09-17 NOTE — Progress Notes (Signed)
Unable to collect UA d/t pt being Anuric.

## 2017-09-17 NOTE — Plan of Care (Signed)
C/O 8/10 back pain PRN hydrocodone given awaiting effectiveness.

## 2017-09-18 DIAGNOSIS — I10 Essential (primary) hypertension: Secondary | ICD-10-CM

## 2017-09-18 DIAGNOSIS — Z992 Dependence on renal dialysis: Secondary | ICD-10-CM

## 2017-09-18 DIAGNOSIS — N186 End stage renal disease: Secondary | ICD-10-CM

## 2017-09-18 LAB — RENAL FUNCTION PANEL
Albumin: 3.3 g/dL — ABNORMAL LOW (ref 3.5–5.0)
Anion gap: 17 — ABNORMAL HIGH (ref 5–15)
BUN: 70 mg/dL — ABNORMAL HIGH (ref 6–20)
CHLORIDE: 94 mmol/L — AB (ref 101–111)
CO2: 24 mmol/L (ref 22–32)
CREATININE: 17.98 mg/dL — AB (ref 0.61–1.24)
Calcium: 8.2 mg/dL — ABNORMAL LOW (ref 8.9–10.3)
GFR calc Af Amer: 4 mL/min — ABNORMAL LOW (ref >60)
GFR calc non Af Amer: 3 mL/min — ABNORMAL LOW (ref >60)
GLUCOSE: 144 mg/dL — AB (ref 65–99)
POTASSIUM: 3.9 mmol/L (ref 3.5–5.1)
Phosphorus: 6.6 mg/dL — ABNORMAL HIGH (ref 2.5–4.6)
Sodium: 135 mmol/L (ref 135–145)

## 2017-09-18 LAB — PROCALCITONIN: Procalcitonin: 8.78 ng/mL

## 2017-09-18 LAB — CBC
HEMATOCRIT: 33.1 % — AB (ref 39.0–52.0)
Hemoglobin: 10.5 g/dL — ABNORMAL LOW (ref 13.0–17.0)
MCH: 28.2 pg (ref 26.0–34.0)
MCHC: 31.7 g/dL (ref 30.0–36.0)
MCV: 89 fL (ref 78.0–100.0)
PLATELETS: 171 10*3/uL (ref 150–400)
RBC: 3.72 MIL/uL — ABNORMAL LOW (ref 4.22–5.81)
RDW: 15.1 % (ref 11.5–15.5)
WBC: 7 10*3/uL (ref 4.0–10.5)

## 2017-09-18 MED ORDER — HEPARIN SODIUM (PORCINE) 1000 UNIT/ML DIALYSIS
4000.0000 [IU] | INTRAMUSCULAR | Status: DC | PRN
  Administered 2017-09-18: 4000 [IU] via INTRAVENOUS_CENTRAL
  Filled 2017-09-18 (×2): qty 4

## 2017-09-18 MED ORDER — HYDRALAZINE HCL 100 MG PO TABS: 100.0000 mg | ORAL_TABLET | Freq: Three times a day (TID) | ORAL | 3 refills | Status: AC

## 2017-09-18 MED ORDER — AMLODIPINE BESYLATE 10 MG PO TABS: 10.0000 mg | ORAL_TABLET | Freq: Every day | ORAL | 2 refills | Status: AC

## 2017-09-18 MED ORDER — ISOSORBIDE MONONITRATE ER 30 MG PO TB24: 90.0000 mg | ORAL_TABLET | Freq: Every day | ORAL | 3 refills | Status: DC

## 2017-09-18 MED ORDER — CLONIDINE HCL 0.2 MG PO TABS: 0.2000 mg | ORAL_TABLET | Freq: Three times a day (TID) | ORAL | 2 refills | Status: AC

## 2017-09-18 MED ORDER — VANCOMYCIN HCL IN DEXTROSE 1-5 GM/200ML-% IV SOLN
INTRAVENOUS | Status: AC
  Filled 2017-09-18: qty 200

## 2017-09-18 MED ORDER — LABETALOL HCL 200 MG PO TABS: 200.0000 mg | ORAL_TABLET | Freq: Three times a day (TID) | ORAL | 3 refills | Status: AC

## 2017-09-18 NOTE — Progress Notes (Signed)
[facility] Progress Note  Subjective: another 4.5 L off w HD today, headache gone, bp's better  Vitals:   09/18/17 1126 09/18/17 1205 09/18/17 1209 09/18/17 1210  BP: (!) 152/86  (!) 165/94 (!) 165/94  Pulse: 73   87  Resp: 17 18    Temp: 98.3 F (36.8 C)     TempSrc: Oral     SpO2: 99% 98%    Weight: 91.2 kg (201 lb 1 oz)     Height:        Inpatient medications: . amLODipine  10 mg Oral Daily  . cinacalcet  60 mg Oral Q supper  . cloNIDine  0.2 mg Oral TID  . heparin injection (subcutaneous)  5,000 Units Subcutaneous Q8H  . hydrALAZINE  100 mg Oral TID  . isosorbide mononitrate  90 mg Oral Daily  . labetalol  200 mg Oral TID  . multivitamin  1 tablet Oral QHS  . sevelamer carbonate  3,200 mg Oral TID WC  . sodium chloride flush  3 mL Intravenous Q12H   . sodium chloride     sodium chloride, hydrALAZINE, HYDROcodone-acetaminophen, ondansetron **OR** ondansetron (ZOFRAN) IV, sevelamer carbonate, sodium chloride flush  Exam:  less puffy in the face more alert and interactive today  no jvd  chest ctab  cor reg no mrg  abd soft ntnd  ext no edema  left arm avf +bruit  Dialysis: nw tss 4h  450/800  87kg  p2  L avf  Heparin 5000 then 3000 midrun - hect 3 - mircera 60 q 2 last 4/6      Impression: 1 LOC episode- due to uremia and uncont htn; 2nd hd today , much better today 2 esrd usual hd is tts 3 volume excess - down close to dry wt after hd today 4 uncont hypertension - resolving  5 fever -not sure etiology, resolved 6 mbd ckd cont meds 7 anemia ckd hb 13 no need for esa 8 dispo stable from renal standpoint  Plan - for dc today after HD   Vinson Moselle MD [facility] pager [phone]   09/18/2017, 2:57 PM   Recent Labs  Lab 09/16/17 1018 09/16/17 1100 09/17/17 0245 09/18/17 0726  NA 136 137 139 135  K 5.5* 5.4* 3.9 3.9  CL 104 110 95* 94*  CO2 15*  --  27 24  GLUCOSE 75 68 114* 144*  BUN 112* 105* 51* 70*   CREATININE 24.85* >18.00* 14.82* 17.98*  CALCIUM 8.7*  --  9.0 8.2*  PHOS  --   --   --  6.6*   Recent Labs  Lab 09/17/17 0245 09/18/17 0726  AST 17  --   ALT 16*  --   ALKPHOS 76  --   BILITOT 0.8  --   PROT 6.9  --   ALBUMIN 3.5 3.3*   Recent Labs  Lab 09/16/17 1018 09/16/17 1100 09/17/17 0245 09/18/17 0725  WBC 10.8*  --  9.8 7.0  NEUTROABS 8.7*  --   --   --   HGB 11.7* 13.3 12.1* 10.5*  HCT 35.7* 39.0 36.6* 33.1*  MCV 89.9  --  88.2 89.0  PLT 183  --  192 171   Iron/TIBC/Ferritin/ %Sat No results found for: IRON, TIBC, FERRITIN, IRONPCTSAT

## 2017-09-18 NOTE — Discharge Summary (Signed)
Physician Discharge Summary [facility]       Patient: Jeremy Huerta                   Admit date: 09/16/2017   DOB: [birth year]             Discharge date:09/18/2017/1:18 PM HYQ:[id number]                           PCP: [name], MD Recommendations for Outpatient Follow-up:   1.  Please follow-up with your primary care physician within 1-2 weeks. 2.  Your nephrologist as scheduled for hemodialysis  Discharge Condition: Stable  CODE STATUS:  Full code    Diet recommendation: Renal diet  ----------------------------------------------------------------------------------------------------------------------  Discharge Diagnoses:   Active Problems:   Hypertension   HCAP (healthcare-associated pneumonia)   End-stage renal disease on hemodialysis [facility])   Syncope   History of present illness :  [name] is a 22 y.o.male,with history of FSGS, end-stage renal disease on hemodialysis Tuesday Thursday and Saturday, hypertension who was brought to the hospital after patient had a syncopal episode at dialysis center. Patient was dialyzed 5 days ago on Thursday 25th April. And he missed dialysis on Saturday,due to lack of transportation. Patient is in the process of moving from [place] to [place]. Patient is currently very somnolent has received diphenhydramine, prochlorperazine the ED. History obtained from patient's mother at bedside. As per mother he did not have any chest pain or shortness of breath. He did complain of back pain, also had diarrhea. In the ED patient was found to have creatinine of greater than 18.0, potassium 5.4. Chest x-ray showed vascular congestion and focally left airspace consolidation in the left lateral base. Hospital course / Brief Summary:  Was subsequently admitted for syncope and hypertensive emergency - It was thought that the patient's syncopal episode might have been exacerbated by controlled hypertension,  narcotics and muscle relaxants since he reflected that he was taking without instructions. Essentially work-up was negative no syncopal episode during hospital course He was resumed on home medication and clonidine, Norvasc, hydralazine, Imdur, labetalol Dosages are adjusted, blood pressure steadily improved  Nephrologist follow the patient closely, he was dialyzed x2 including today 09/18/2017. During his hospital stay he had a low-grade fever no leukocytosis or any other signs of infection chest x-ray was negative no UA could be obtained as patient is now making urine at this time.Marland Kitchen He was treated with spectrum of IV vancomycin and Zosyn subsequently was DC'd as patient was noted to be stable no source of infection or signs of infection   Back pain, x-ray of thoracic spine revealed no acute or chronic changes She was advised to avoid muscle relaxants and narcotics.  PRN Tylenol may be utilized.  He was advised to be compliant with his medication, renal diet, continue with his nephrologist and continue hemodialysis as scheduled    Consultations:  Nephrologist  Procedures: Hemodialysis x2 ----------------------------------------------------------------------------------------------------------------------  Discharge Instructions:   Discharge Instructions    Activity as tolerated - No restrictions   Complete by:  As directed    Diet - low sodium heart healthy   Complete by:  As directed    Renal diet   Discharge instructions   Complete by:  As directed    Continue to follow-up with your nephrologist, as scheduled hemodialysis Please take your medication as instructed, may be adjusted by your nephrologist   Increase activity slowly   Complete by:  As directed        No Known Allergies    Procedures/Studies: Dg Chest 1 View  Result Date: 09/16/2017 CLINICAL DATA:  Syncope EXAM: CHEST  1 VIEW COMPARISON:  June 24, 2017 FINDINGS: There is a focal area of airspace  consolidation in the lateral left base. No similar changes elsewhere. There is mild cardiomegaly with pulmonary venous hypertension. No adenopathy. No bone lesions. IMPRESSION: Focal area of airspace consolidation consistent with pneumonia lateral left base. Pulmonary vascular congestion.  No adenopathy evident. Electronically Signed   By: Bretta Bang III M.D.   On: 09/16/2017 09:57   Dg Thoracic Spine 2 View  Result Date: 09/16/2017 CLINICAL DATA:  Syncopal episode today with resultant fall. Patient complains of neck and upper back pain. EXAM: THORACIC SPINE 2 VIEWS COMPARISON:  PA and lateral chest x-ray of June 24, 2017 FINDINGS: The thoracic vertebral bodies are preserved in height. The disc space heights are well maintained. The pedicles are intact. There are no abnormal paravertebral soft tissue densities. IMPRESSION: There is no acute or significant chronic bony abnormality of the thoracic spine. Electronically Signed   By: David  [place] M.D.   On: 09/16/2017 11:53   Ct Head Wo Contrast  Result Date: 09/16/2017 CLINICAL DATA:  Syncope.  Renal failure EXAM: CT HEAD WITHOUT CONTRAST TECHNIQUE: Contiguous axial images were obtained from the base of the skull through the vertex without intravenous contrast. COMPARISON:  April 22, 2017 FINDINGS: Brain: The ventricles are normal in size and configuration. There is no intracranial mass, hemorrhage, extra-axial fluid collection, or midline shift. Gray-white compartments are normal. No acute infarct evident. Vascular: No hyperdense vessel appreciable. There is no appreciable arterial vascular calcification. Skull: Bony calvarium appears intact. Sinuses/Orbits: There is mucosal thickening in several ethmoid air cells. There is slight mucosal thickening in the medial right maxillary antrum. Frontal sinuses are nearly aplastic. Visualized paranasal sinuses elsewhere clear. Orbits appear symmetric bilaterally. Other: Mastoid air cells are clear.  IMPRESSION: Mild paranasal sinus disease.  Study otherwise unremarkable. Electronically Signed   By: Bretta Bang III M.D.   On: 09/16/2017 13:15      Subjective: Patient was seen and examined 09/18/2017, 1:18 PM Patient stable  Today. No acute distress.  No issues overnight Stable for discharge.  Discharge Exam:  Vitals:   09/18/17 1126 09/18/17 1205 09/18/17 1209 09/18/17 1210  BP: (!) 152/86  (!) 165/94 (!) 165/94  Pulse: 73   87  Resp: 17 18    Temp: 98.3 F (36.8 C)     TempSrc: Oral     SpO2: 99% 98%    Weight: 91.2 kg (201 lb 1 oz)     Height:        General: Pt lying comfortably in bed & appears in no obvious distress. Cardiovascular: S1 & S2 heard, RRR, S1/S2 +. No murmurs, rubs, gallops or clicks. No JVD or pedal edema. Respiratory: Clear to auscultation without wheezing, rhonchi or crackles. No increased work of breathing. Abdominal:  Non distended, non tender & soft. No organomegaly or masses appreciated. Normal bowel sounds heard. CNS: Alert and oriented. No focal deficits. Extremities: no edema, no cyanosis    The results of significant diagnostics from this hospitalization (including imaging, microbiology, ancillary and laboratory) are listed below for reference.     Microbiology: Recent Results (from the past 240 hour(s))  MRSA PCR Screening     Status: None   Collection Time: 09/16/17  9:09 PM  Result Value Ref Range Status  MRSA by PCR NEGATIVE NEGATIVE Final    Comment:        The GeneXpert MRSA Assay (FDA approved for NASAL specimens only), is one component of a comprehensive MRSA colonization surveillance program. It is not intended to diagnose MRSA infection nor to guide or monitor treatment for MRSA infections. Performed at [facility], [address] [place], [place] [address]      Labs: CBC: Recent Labs  Lab 09/16/17 1018 09/16/17 1100 09/17/17 0245 09/18/17 0725  WBC 10.8*  --  9.8 7.0  NEUTROABS 8.7*  --   --    --   HGB 11.7* 13.3 12.1* 10.5*  HCT 35.7* 39.0 36.6* 33.1*  MCV 89.9  --  88.2 89.0  PLT 183  --  192 171   Basic Metabolic Panel: Recent Labs  Lab 09/16/17 1018 09/16/17 1100 09/17/17 0245 09/18/17 0726  NA 136 137 139 135  K 5.5* 5.4* 3.9 3.9  CL 104 110 95* 94*  CO2 15*  --  27 24  GLUCOSE 75 68 114* 144*  BUN 112* 105* 51* 70*  CREATININE 24.85* >18.00* 14.82* 17.98*  CALCIUM 8.7*  --  9.0 8.2*  PHOS  --   --   --  6.6*   Liver Function Tests: Recent Labs  Lab 09/17/17 0245 09/18/17 0726  AST 17  --   ALT 16*  --   ALKPHOS 76  --   BILITOT 0.8  --   PROT 6.9  --   ALBUMIN 3.5 3.3*   BNP (last 3 results) Recent Labs    01/13/17 1358  BNP 2,858.3*   Cardiac Enzymes: No results for input(s): CKTOTAL, CKMB, CKMBINDEX, TROPONINI in the last 168 hours. CBG: Recent Labs  Lab 09/16/17 1026  GLUCAP 76   Hgb A1c No results for input(s): HGBA1C in the last 72 hours. Lipid Profile No results for input(s): CHOL, HDL, LDLCALC, TRIG, CHOLHDL, LDLDIRECT in the last 72 hours. Thyroid function studies No results for input(s): TSH, T4TOTAL, T3FREE, THYROIDAB in the last 72 hours.  Invalid input(s): FREET3 Anemia work up No results for input(s): VITAMINB12, FOLATE, FERRITIN, TIBC, IRON, RETICCTPCT in the last 72 hours. Urinalysis    Component Value Date/Time   COLORURINE YELLOW 07/20/2014 0114   APPEARANCEUR CLOUDY (A) 07/20/2014 0114   LABSPEC 1.013 07/20/2014 0114   PHURINE 6.0 07/20/2014 0114   GLUCOSEU NEGATIVE 07/20/2014 0114   HGBUR SMALL (A) 07/20/2014 0114   BILIRUBINUR NEGATIVE 07/20/2014 0114   KETONESUR NEGATIVE 07/20/2014 0114   PROTEINUR >300 (A) 07/20/2014 0114   UROBILINOGEN 0.2 07/20/2014 0114   NITRITE NEGATIVE 07/20/2014 0114   LEUKOCYTESUR NEGATIVE 07/20/2014 0114    Time coordinating discharge: Over 30 minutes  SIGNED: Kendell Bane, MD, FACP, FHM. [facility],  Pager [phone]  If 7PM-7AM, please contact  night-coverage [email or web address], Password [facility] 09/18/2017, 1:18 PM

## 2017-09-18 NOTE — Progress Notes (Signed)
09/18/2017 1800 Discharge AVS meds taken today and those due this evening reviewed.  Follow-up appointments and when to call md reviewed.  D/C IV and TELE.  Questions and concerns addressed.   D/C home per orders. Kathryne Hitch

## 2017-09-22 LAB — CULTURE, BLOOD (ROUTINE X 2)
CULTURE: NO GROWTH
CULTURE: NO GROWTH
SPECIAL REQUESTS: ADEQUATE
SPECIAL REQUESTS: ADEQUATE

## 2017-10-09 IMAGING — DX DG CHEST 2V
2 series · 2 of 2 positions shown · non-contrast
Comparison: Portable chest x-ray January 13, 2017

CLINICAL DATA: Shortness of breath, hypertension, history of
respiratory failure, dialysis dependent renal failure.

EXAM:
CHEST  2 VIEW

[chest pa]
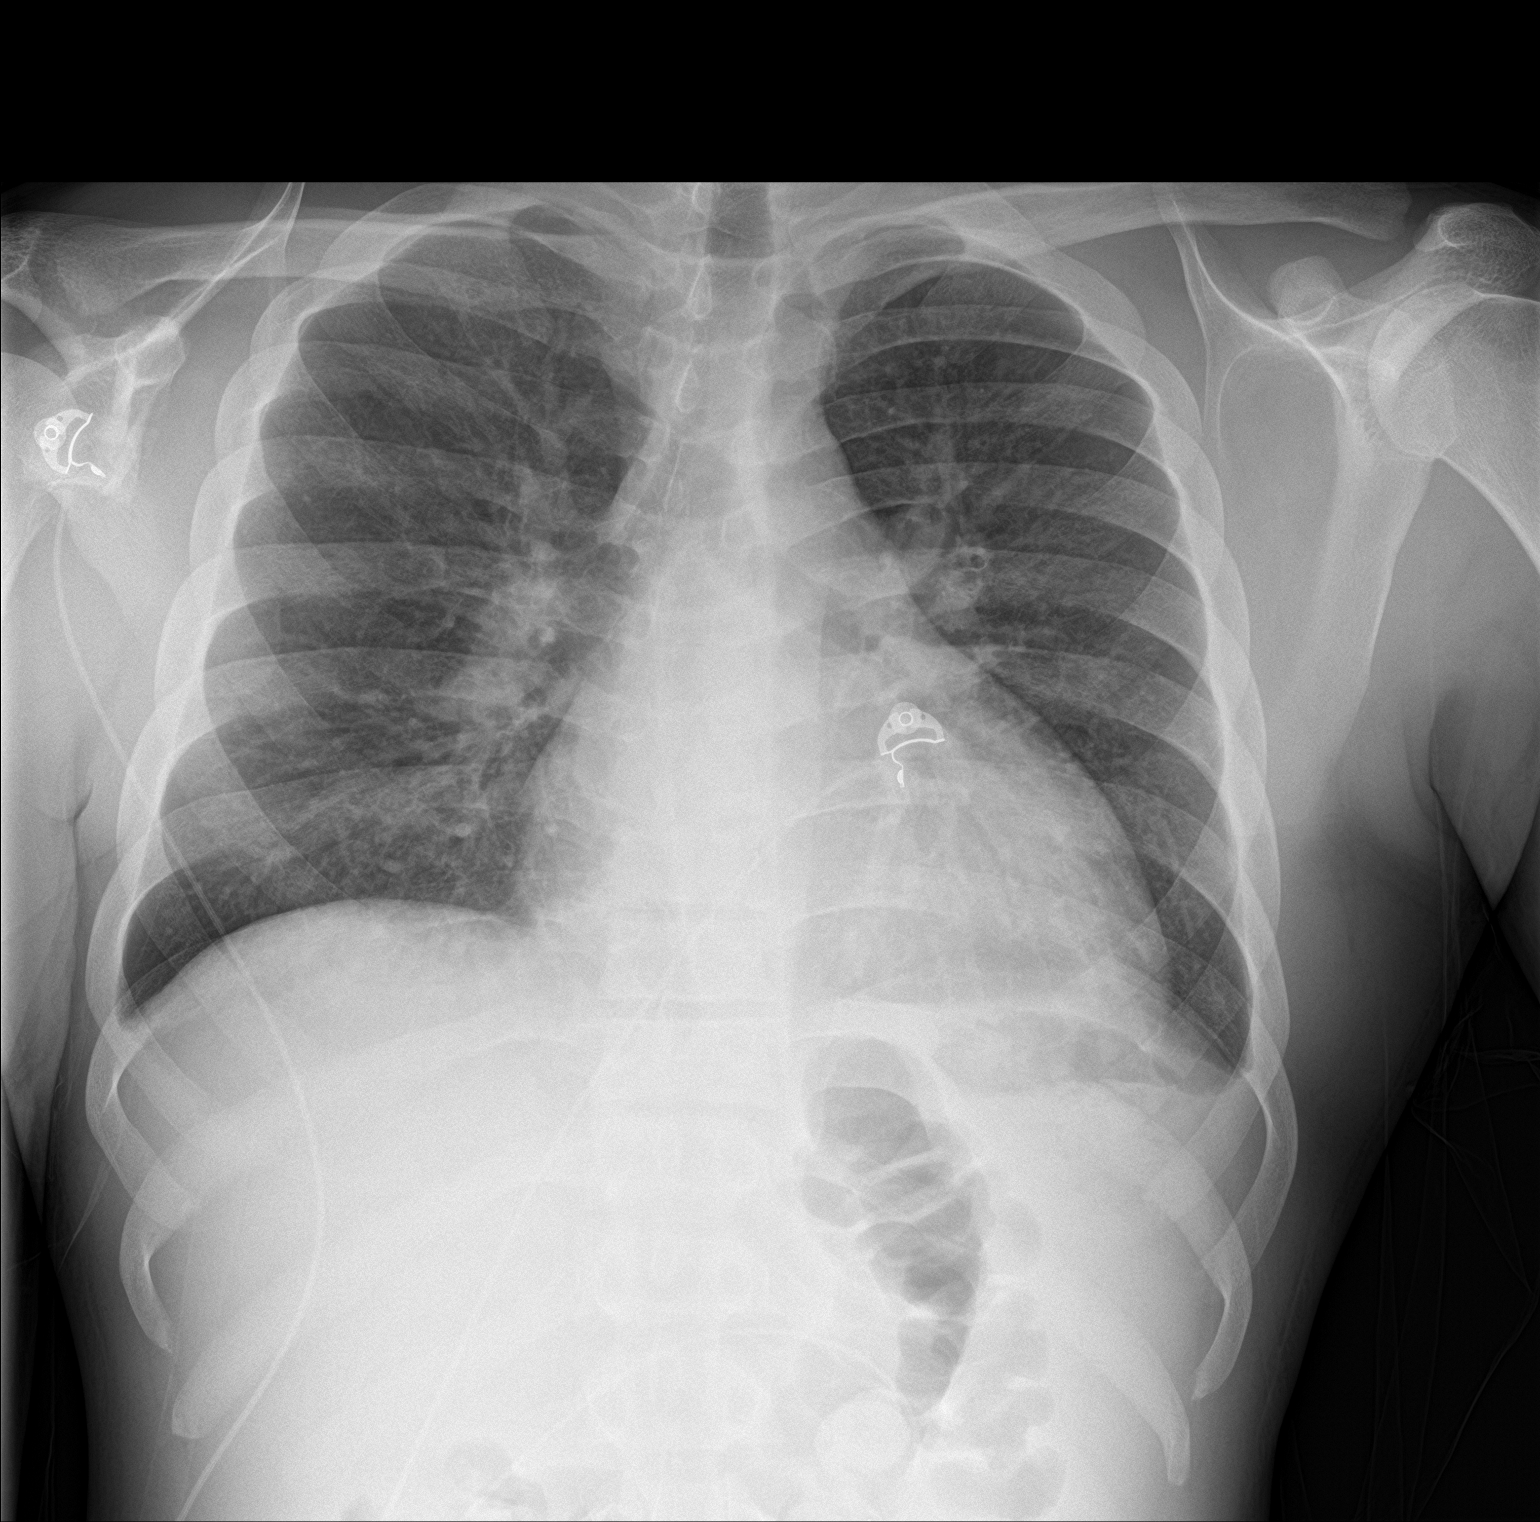

[chest lat]
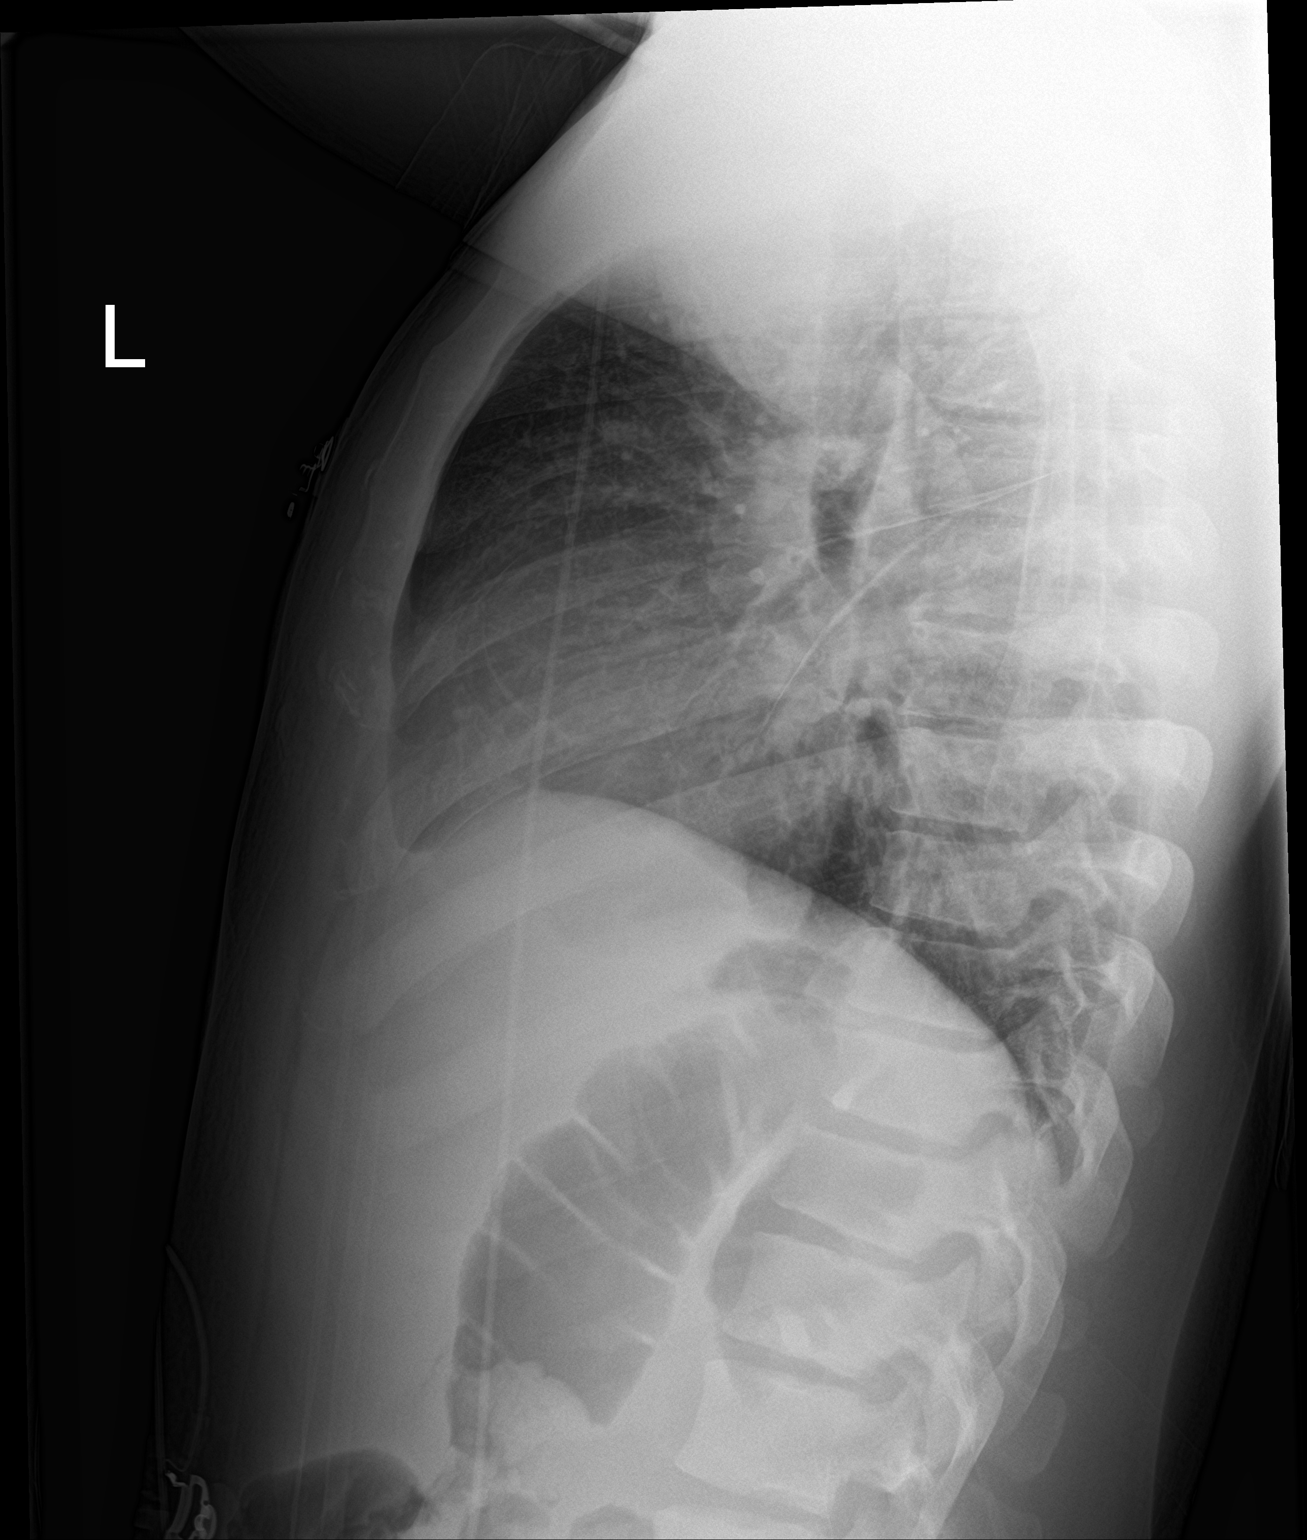

[2 of 2 positions shown; findings below may reference images not displayed]

FINDINGS: The lungs are adequately inflated. Fluffy alveolar opacities seen
yesterday have nearly totally cleared. The pulmonary vascularity
remains mildly engorged. There small bilateral pleural effusions.
The cardiac silhouette remains enlarged. The trachea is midline. The
bony thorax is unremarkable.
IMPRESSION: Improving CHF. Persistent mild central pulmonary vascular congestion
and cardiomegaly. Small bilateral pleural effusions peer

## 2017-11-25 ENCOUNTER — Emergency Department (HOSPITAL_COMMUNITY)
Admission: EM | Admit: 2017-11-25 | Discharge: 2017-11-25 | Disposition: A | Discharge status: Home / Self Care | Payer: Medicaid Other | Attending: Emergency Medicine | Admitting: Emergency Medicine

## 2017-11-25 ENCOUNTER — Encounter (HOSPITAL_COMMUNITY): Payer: Self-pay

## 2017-11-25 DIAGNOSIS — N186 End stage renal disease: Secondary | ICD-10-CM | POA: Diagnosis not present

## 2017-11-25 DIAGNOSIS — I120 Hypertensive chronic kidney disease with stage 5 chronic kidney disease or end stage renal disease: Secondary | ICD-10-CM | POA: Diagnosis not present

## 2017-11-25 DIAGNOSIS — R519 Headache, unspecified: Secondary | ICD-10-CM

## 2017-11-25 DIAGNOSIS — Z79899 Other long term (current) drug therapy: Secondary | ICD-10-CM | POA: Diagnosis not present

## 2017-11-25 DIAGNOSIS — Z992 Dependence on renal dialysis: Secondary | ICD-10-CM | POA: Insufficient documentation

## 2017-11-25 DIAGNOSIS — R51 Headache: Secondary | ICD-10-CM | POA: Diagnosis present

## 2017-11-25 LAB — COMPREHENSIVE METABOLIC PANEL
ALT: 21 U/L (ref 0–44)
AST: 18 U/L (ref 15–41)
Albumin: 4 g/dL (ref 3.5–5.0)
Alkaline Phosphatase: 85 U/L (ref 38–126)
Anion gap: 15 (ref 5–15)
BILIRUBIN TOTAL: 0.9 mg/dL (ref 0.3–1.2)
BUN: 50 mg/dL — AB (ref 6–20)
CALCIUM: 9.1 mg/dL (ref 8.9–10.3)
CO2: 29 mmol/L (ref 22–32)
CREATININE: 16.18 mg/dL — AB (ref 0.61–1.24)
Chloride: 97 mmol/L — ABNORMAL LOW (ref 98–111)
GFR calc Af Amer: 4 mL/min — ABNORMAL LOW (ref >60)
GFR, EST NON AFRICAN AMERICAN: 4 mL/min — AB (ref >60)
Glucose, Bld: 93 mg/dL (ref 70–99)
Potassium: 5.4 mmol/L — ABNORMAL HIGH (ref 3.5–5.1)
Sodium: 141 mmol/L (ref 135–145)
TOTAL PROTEIN: 7.6 g/dL (ref 6.5–8.1)

## 2017-11-25 LAB — CBC
HCT: 34.2 % — ABNORMAL LOW (ref 39.0–52.0)
Hemoglobin: 10.6 g/dL — ABNORMAL LOW (ref 13.0–17.0)
MCH: 27.7 pg (ref 26.0–34.0)
MCHC: 31 g/dL (ref 30.0–36.0)
MCV: 89.5 fL (ref 78.0–100.0)
PLATELETS: 226 10*3/uL (ref 150–400)
RBC: 3.82 MIL/uL — ABNORMAL LOW (ref 4.22–5.81)
RDW: 13.1 % (ref 11.5–15.5)
WBC: 9.1 10*3/uL (ref 4.0–10.5)

## 2017-11-25 LAB — LIPASE, BLOOD: Lipase: 42 U/L (ref 11–51)

## 2017-11-25 MED ORDER — ONDANSETRON 4 MG PO TBDP
4.0000 mg | ORAL_TABLET | Freq: Once | ORAL | Status: AC | PRN
  Administered 2017-11-25: 4 mg via ORAL
  Filled 2017-11-25: qty 1

## 2017-11-25 MED ORDER — CYCLOBENZAPRINE HCL 10 MG PO TABS
5.0000 mg | ORAL_TABLET | Freq: Once | ORAL | Status: AC
  Administered 2017-11-25: 5 mg via ORAL
  Filled 2017-11-25: qty 1

## 2017-11-25 MED ORDER — ACETAMINOPHEN 325 MG PO TABS
650.0000 mg | ORAL_TABLET | Freq: Once | ORAL | Status: AC
  Administered 2017-11-25: 650 mg via ORAL
  Filled 2017-11-25: qty 2

## 2017-11-25 NOTE — ED Provider Notes (Signed)
Patient placed in Quick Look pathway, seen and evaluated   Chief Complaint: headache  HPI:   Throbbing headache with n/v/d and dizziness x 2 days.  L ear pain and occasional sneezing.  Dialysis pt, had dialysis today  ROS: +chills, -fever (one)  Physical Exam:   Gen: No distress  Neuro: Awake and Alert  Skin: Warm    Focused Exam: no nuchal rigidity, no scalp tenderness   Initiation of care has begun. The patient has been counseled on the process, plan, and necessity for staying for the completion/evaluation, and the remainder of the medical screening examination    Fayrene Helperran, Nevin Kozuch, Cordelia Poche-C 11/25/17 1757    Eber HongMiller, Brian, MD 11/28/17 [phone]

## 2017-11-25 NOTE — ED Notes (Signed)
Discharge instructions reviewed with patient and pt denied any further requests. Signature pad unavailable at this time, pt verbalized understanding.

## 2017-11-25 NOTE — ED Provider Notes (Signed)
[facility] EMERGENCY DEPARTMENT Provider Note   CSN: [id number] Arrival date & time: 11/25/17  1746     History   Chief Complaint Chief Complaint  Patient presents with  . Headache    HPI Jeremy Huerta is a 22 y.o. male with a pmh of ESRD HD on Tu/Th/Sat. Patient states that he missed dialysis on Sat 3 days ago because he went out of town with his family.  Patient states that he prior to evaluation.  He is also complaining of some muscular stiffness in his neck but has full range of motion without photophobia, nausea, vomiting, fevers, chills.  Today he also had several episodes of watery diarrhea.  He does not have any known sick contacts.  He was able to go to dialysis today and get a full session.  He took his regular blood pressure medications after dialysis today.  He states that he continues to have the headache.  Patient states that he was cleaning his left ear with a Q-tip 2 days ago and noticed a little bit of blood afterward.  He denies any active ear pain at this time, denies history of barotrauma recent swimming.  HPI  Past Medical History:  Diagnosis Date  . Acute respiratory failure (HCC)   . ESRD (end stage renal disease) (HCC)    TTS  . FSGS (focal segmental glomerulosclerosis)   . Hypertension   . Nausea & vomiting 11/2016  . Pneumonia 06/24/2017    Patient Active Problem List   Diagnosis Date Noted  . Syncope 09/16/2017  . HAP (hospital-acquired pneumonia) 06/24/2017  . Sepsis (HCC) 06/24/2017  . Abdominal pain 06/24/2017  . Vomiting 04/29/2017  . Skin abscess 03/07/2017  . 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 Back pain 03/05/2017  . Hypertensive emergency 01/13/2017  . History of anemia due to chronic kidney disease 01/13/2017  . Secondary hyperparathyroidism (HCC) 01/13/2017  . Bleeding from the nose 01/13/2017  . Fever 01/13/2017  . Chest pain on breathing   . SOB (shortness of breath)   . Gastroesophageal reflux disease     . Respiratory failure (HCC) 12/24/2016  . Acute pulmonary edema (HCC)   . End-stage renal disease on hemodialysis (HCC)   . Hypertensive urgency 12/02/2016  . Leukocytosis 12/02/2016  . Nausea & vomiting 12/02/2016  . Hyperkalemia 12/02/2016  . Acute respiratory failure (HCC) 04/22/2016  . Diarrhea 04/22/2016  . HCAP (healthcare-associated pneumonia) 04/22/2016  . Tachycardia 04/22/2016  . Hypertension     Past Surgical History:  Procedure Laterality Date  . AV FISTULA PLACEMENT  12/2015  . RENAL BIOPSY  2013        Home Medications    Prior to Admission medications   Medication Sig Start Date End Date Taking? Authorizing Provider  amLODipine (NORVASC) 10 MG tablet Take 1 tablet (10 mg total) by mouth daily. 09/18/17 11/25/17 Yes Shahmehdi, Gemma Payor, MD  B Complex-C-Folic Acid (B COMPLEX-VITAMIN C-FOLIC ACID) 1 MG tablet Take 1 tablet by mouth daily. 04/30/16  Yes [provider]  cinacalcet (SENSIPAR) 90 MG tablet Take 90 mg by mouth daily. 10/01/17  Yes [provider]  cloNIDine (CATAPRES) 0.2 MG tablet Take 1 tablet (0.2 mg total) by mouth 3 (three) times daily. 09/18/17 11/25/17 Yes Shahmehdi, Seyed A, MD  fluticasone (FLONASE) 50 MCG/ACT nasal spray Place 1 spray daily into both nostrils. Patient taking differently: Place 1 spray into both nostrils daily as needed for allergies or rhinitis.  04/05/17  Yes Canary Brim  L, NP  hydrALAZINE (APRESOLINE) 100 MG tablet Take 1 tablet (100 mg total) by mouth 3 (three) times daily. 09/18/17 11/25/17 Yes Shahmehdi, Gemma Payor, MD  isosorbide mononitrate (IMDUR) 30 MG 24 hr tablet Take 3 tablets (90 mg total) by mouth daily. 09/19/17 11/25/17 Yes Shahmehdi, Seyed A, MD  labetalol (NORMODYNE) 200 MG tablet Take 1 tablet (200 mg total) by mouth 3 (three) times daily. 09/18/17 11/25/17 Yes Shahmehdi, Seyed A, MD  sevelamer (RENAGEL) 800 MG tablet Take 800-3,200 mg by mouth See admin instructions. 2,400-3,200 mg three times a day with meals  and 800 mg with each snack   Yes [provider]    Family History Family History  Problem Relation Age of Onset  . Hypertension Mother   . Diabetes Father   . Hypertension Father   . Kidney disease Father   . Hypertension Maternal Grandmother   . Diabetes Paternal Grandmother   . Hypertension Paternal Grandmother     Social History Social History   Tobacco Use  . Smoking status: Never Smoker  . Smokeless tobacco: Never Used  Substance Use Topics  . Alcohol use: No  . Drug use: No     Allergies   Patient has no known allergies.   Review of Systems Review of Systems  Ten systems reviewed and are negative for acute change, except as noted in the HPI.   Physical Exam Updated Vital Signs BP (!) 147/89 (BP Location: Right Arm)   Pulse 86   Temp 98.8 F (37.1 C) (Oral)   Resp 16   Ht 6' (1.829 m)   Wt 91.2 kg (201 lb 1 oz)   SpO2 100%   BMI 27.27 kg/m   Physical Exam  Constitutional: He is oriented to person, place, and time. He appears well-developed and well-nourished. No distress.  HENT:  Head: Normocephalic and atraumatic.  Mouth/Throat: Oropharynx is clear and moist.  Eyes: Pupils are equal, round, and reactive to light. Conjunctivae and EOM are normal. No scleral icterus. Right eye exhibits normal extraocular motion and no nystagmus. Left eye exhibits normal extraocular motion and no nystagmus.  No horizontal, vertical or rotational nystagmus  Neck: Normal range of motion. Neck supple. No neck rigidity. No Brudzinski's sign noted.  Full active and passive ROM without pain No midline or paraspinal tenderness No nuchal rigidity or meningeal signs  Cardiovascular: Normal rate, regular rhythm and intact distal pulses.  Pulmonary/Chest: Effort normal and breath sounds normal. No respiratory distress. He has no wheezes. He has no rales.  Abdominal: Soft. Bowel sounds are normal. There is no tenderness. There is no rebound and no guarding.   Musculoskeletal: Normal range of motion.  Lymphadenopathy:    He has no cervical adenopathy.  Neurological: He is alert and oriented to person, place, and time. He has normal strength. He displays normal reflexes. No cranial nerve deficit. He exhibits normal muscle tone. He displays a negative Romberg sign. Coordination normal. GCS eye subscore is 4. GCS verbal subscore is 5. GCS motor subscore is 6. He displays no Babinski's sign on the right side. He displays no Babinski's sign on the left side.  Mental Status:  Alert, oriented, thought content appropriate. Speech fluent without evidence of aphasia. Able to follow 2 step commands without difficulty.  Cranial Nerves:  II:  Peripheral visual fields grossly normal, pupils equal, round, reactive to light III,IV, VI: ptosis not present, extra-ocular motions intact bilaterally  V,VII: smile symmetric, facial light touch sensation equal VIII: hearing grossly normal bilaterally  IX,X: midline uvula rise  XI: bilateral shoulder shrug equal and strong XII: midline tongue extension  Motor:  5/5 in upper and lower extremities bilaterally including strong and equal grip strength and dorsiflexion/plantar flexion Sensory: Pinprick and light touch normal in all extremities.  Cerebellar: normal finger-to-nose with bilateral upper extremities Gait: normal gait and balance CV: distal pulses palpable throughout   Skin: Skin is warm and dry. Capillary refill takes less than 2 seconds. No rash noted. He is not diaphoretic.  Psychiatric: He has a normal mood and affect. His behavior is normal. Judgment and thought content normal.  Nursing note and vitals reviewed.    ED Treatments / Results  Labs (all labs ordered are listed, but only abnormal results are displayed) Labs Reviewed  COMPREHENSIVE METABOLIC PANEL - Abnormal; Notable for the following components:      Result Value   Potassium 5.4 (*)    Chloride 97 (*)    BUN 50 (*)    Creatinine, Ser  16.18 (*)    GFR calc non Af Amer 4 (*)    GFR calc Af Amer 4 (*)    All other components within normal limits  CBC - Abnormal; Notable for the following components:   RBC 3.82 (*)    Hemoglobin 10.6 (*)    HCT 34.2 (*)    All other components within normal limits  LIPASE, BLOOD  URINALYSIS, ROUTINE W REFLEX MICROSCOPIC    EKG None  Radiology No results found.  Procedures Procedures (including critical care time)  Medications Ordered in ED Medications  ondansetron (ZOFRAN-ODT) disintegrating tablet 4 mg (4 mg Oral Given 11/25/17 1757)     Initial Impression / Assessment and Plan / ED Course  I have reviewed the triage vital signs and the nursing notes.  Pertinent labs & imaging results that were available during my care of the patient were reviewed by me and considered in my medical decision making (see chart for details).     Karin GoldenDeanthony Drew is a 22 y.o. male who presents to ED for Headache. No focal neuro deficits on exam.  . The patient denies any neurologic symptoms such as visual changes, focal numbness/weakness, balance problems, confusion, or speech difficulty to suggest a life-threatening intracranial process such as intracranial hemorrhage or mass. The patient has no clotting risk factors thus venous sinus thrombosis is unlikely. No fevers, neck pain or nuchal rigidity to suggest meningitis. I feel that the patient is safe for discharge home at this time. PCP follow up strongly encouraged. I have reviewed return precautions including development of neurologic symptoms, confusion, lethargy, difficulty speaking, or new/worsening/concerning symptoms. All questions answered.   Final Clinical Impressions(s) / ED Diagnoses   Final diagnoses:  Moderate headache    ED Discharge Orders    None       Arthor CaptainHarris, Larren Copes, PA-C 11/26/17 0001    Eber HongMiller, Brian, MD 11/28/17 [phone]

## 2017-11-25 NOTE — Discharge Instructions (Addendum)

## 2017-11-25 NOTE — ED Triage Notes (Addendum)
Pt presents with 2 day h/o occipital headache that radiates into neck and into spine.  Pt has HD, had full treatment today; EMS reports BP at fire station was 190 /140 with theirs 184/104.  Pt reports L ear pain x 2 days, reports dizziness.  Pt reports photophobia and nausea with headache.  Pt reports taking afternoon meds x 30 minutes ago, denies any recent dose change.  Pt also reports 2 day h/o umbilical pain and diarrhea, nausea.

## 2017-11-25 NOTE — ED Notes (Signed)
ED Provider at bedside. 

## 2017-11-26 ENCOUNTER — Other Ambulatory Visit: Payer: Self-pay

## 2017-11-26 ENCOUNTER — Emergency Department (HOSPITAL_COMMUNITY)
Admission: EM | Admit: 2017-11-26 | Discharge: 2017-11-26 | Discharge status: Against Medical Advice | Payer: Medicaid Other | Attending: Emergency Medicine | Admitting: Emergency Medicine

## 2017-11-26 ENCOUNTER — Encounter (HOSPITAL_COMMUNITY): Payer: Self-pay | Admitting: Emergency Medicine

## 2017-11-26 DIAGNOSIS — R51 Headache: Secondary | ICD-10-CM | POA: Insufficient documentation

## 2017-11-26 DIAGNOSIS — Z5321 Procedure and treatment not carried out due to patient leaving prior to being seen by health care provider: Secondary | ICD-10-CM | POA: Diagnosis not present

## 2017-11-26 LAB — LIPASE, BLOOD: LIPASE: 71 U/L — AB (ref 11–51)

## 2017-11-26 LAB — COMPREHENSIVE METABOLIC PANEL
ALT: 22 U/L (ref 0–44)
AST: 21 U/L (ref 15–41)
Albumin: 3.8 g/dL (ref 3.5–5.0)
Alkaline Phosphatase: 81 U/L (ref 38–126)
Anion gap: 18 — ABNORMAL HIGH (ref 5–15)
BUN: 69 mg/dL — AB (ref 6–20)
CHLORIDE: 96 mmol/L — AB (ref 98–111)
CO2: 26 mmol/L (ref 22–32)
CREATININE: 19.1 mg/dL — AB (ref 0.61–1.24)
Calcium: 9 mg/dL (ref 8.9–10.3)
GFR, EST AFRICAN AMERICAN: 3 mL/min — AB (ref >60)
GFR, EST NON AFRICAN AMERICAN: 3 mL/min — AB (ref >60)
Glucose, Bld: 93 mg/dL (ref 70–99)
POTASSIUM: 4.8 mmol/L (ref 3.5–5.1)
SODIUM: 140 mmol/L (ref 135–145)
Total Bilirubin: 0.6 mg/dL (ref 0.3–1.2)
Total Protein: 7.5 g/dL (ref 6.5–8.1)

## 2017-11-26 LAB — CBC
HEMATOCRIT: 34.4 % — AB (ref 39.0–52.0)
Hemoglobin: 10.8 g/dL — ABNORMAL LOW (ref 13.0–17.0)
MCH: 28.2 pg (ref 26.0–34.0)
MCHC: 31.4 g/dL (ref 30.0–36.0)
MCV: 89.8 fL (ref 78.0–100.0)
PLATELETS: 235 10*3/uL (ref 150–400)
RBC: 3.83 MIL/uL — AB (ref 4.22–5.81)
RDW: 13 % (ref 11.5–15.5)
WBC: 6.6 10*3/uL (ref 4.0–10.5)

## 2017-11-26 MED ORDER — ACETAMINOPHEN 325 MG PO TABS: 650.0000 mg | ORAL_TABLET | Freq: Once | ORAL | Status: DC

## 2017-11-26 MED ORDER — CYCLOBENZAPRINE HCL 10 MG PO TABS: 5.0000 mg | ORAL_TABLET | Freq: Once | ORAL | Status: DC

## 2017-11-26 MED ORDER — ONDANSETRON 4 MG PO TBDP: 8.0000 mg | ORAL_TABLET | Freq: Once | ORAL | Status: DC

## 2017-11-26 NOTE — ED Notes (Signed)
Called Patient to be roomed x3 and had no response. 

## 2017-11-26 NOTE — ED Triage Notes (Signed)
Pt to ED via [facility] for headache (nagging pain to back of head) and neck x 2 days.  Reports diarrhea x 4 since yesterday.  States he was seen in ED yesterday for same.  Reports lower abd pain since 2pm today with vomiting x 2.  Last dialysis yesterday.

## 2017-11-26 NOTE — ED Notes (Signed)
This nurse first unable to locate pt.

## 2017-11-26 NOTE — ED Notes (Signed)
Pt states he does not produce urine as he is on dialysis. No UA to be collected.

## 2017-11-26 NOTE — ED Provider Notes (Signed)
Patient placed in Quick Look pathway, seen and evaluated   Chief Complaint: +headache, +abdominal pain  HPI:   22 year old male presents with recurrent headache, neck pain, back pain.  He was seen here in the emergency department yesterday for the same.  He left after he was feeling better after Tylenol and Zofran.  Today he started having diffuse abdominal pain, nausea, vomiting, diarrhea.  He does not make any urine.  He states that he has had this abdominal pain before in the past and has been diagnosed with "inflamed colon".  He goes to dialysis Tuesday, Thursday, Saturday.  He last dialyzed yesterday for full treatment.  ROS: + Headache, abdominal pain  Physical Exam:   Gen: No distress  Neuro: Awake and Alert  Skin: Warm    Focused Exam: Heart: Regular rate and rhythm    Lungs: CTA    Abdomen: Soft, generally tender   Initiation of care has begun. The patient has been counseled on the process, plan, and necessity for staying for the completion/evaluation, and the remainder of the medical screening examination    Bethel BornGekas, Toneka Fullen Marie, PA-C 11/26/17 Candise Che1957    Isaacs, Cameron, MD 11/26/17 2320

## 2018-01-09 ENCOUNTER — Non-Acute Institutional Stay (HOSPITAL_COMMUNITY)
Admission: EM | Admit: 2018-01-09 | Discharge: 2018-01-10 | Disposition: A | Discharge status: Home / Self Care | Payer: Medicaid Other | Attending: Emergency Medicine | Admitting: Emergency Medicine

## 2018-01-09 ENCOUNTER — Encounter (HOSPITAL_COMMUNITY): Payer: Self-pay | Admitting: Emergency Medicine

## 2018-01-09 ENCOUNTER — Non-Acute Institutional Stay (HOSPITAL_COMMUNITY): Payer: Medicaid Other

## 2018-01-09 DIAGNOSIS — K219 Gastro-esophageal reflux disease without esophagitis: Secondary | ICD-10-CM | POA: Diagnosis not present

## 2018-01-09 DIAGNOSIS — N2581 Secondary hyperparathyroidism of renal origin: Secondary | ICD-10-CM | POA: Diagnosis not present

## 2018-01-09 DIAGNOSIS — N186 End stage renal disease: Secondary | ICD-10-CM | POA: Insufficient documentation

## 2018-01-09 DIAGNOSIS — Z79899 Other long term (current) drug therapy: Secondary | ICD-10-CM | POA: Insufficient documentation

## 2018-01-09 DIAGNOSIS — I120 Hypertensive chronic kidney disease with stage 5 chronic kidney disease or end stage renal disease: Secondary | ICD-10-CM | POA: Insufficient documentation

## 2018-01-09 DIAGNOSIS — Z992 Dependence on renal dialysis: Secondary | ICD-10-CM | POA: Diagnosis not present

## 2018-01-09 DIAGNOSIS — R55 Syncope and collapse: Secondary | ICD-10-CM | POA: Diagnosis not present

## 2018-01-09 DIAGNOSIS — D631 Anemia in chronic kidney disease: Secondary | ICD-10-CM | POA: Insufficient documentation

## 2018-01-09 DIAGNOSIS — E875 Hyperkalemia: Secondary | ICD-10-CM | POA: Insufficient documentation

## 2018-01-09 LAB — BASIC METABOLIC PANEL
ANION GAP: 16 — AB (ref 5–15)
BUN: 23 mg/dL — AB (ref 6–20)
CO2: 27 mmol/L (ref 22–32)
Calcium: 10.2 mg/dL (ref 8.9–10.3)
Chloride: 94 mmol/L — ABNORMAL LOW (ref 98–111)
Creatinine, Ser: 11.38 mg/dL — ABNORMAL HIGH (ref 0.61–1.24)
GFR calc Af Amer: 6 mL/min — ABNORMAL LOW (ref >60)
GFR, EST NON AFRICAN AMERICAN: 6 mL/min — AB (ref >60)
GLUCOSE: 89 mg/dL (ref 70–99)
POTASSIUM: 4.3 mmol/L (ref 3.5–5.1)
Sodium: 137 mmol/L (ref 135–145)

## 2018-01-09 LAB — CBC WITH DIFFERENTIAL/PLATELET
Abs Immature Granulocytes: 0 10*3/uL (ref 0.0–0.1)
BASOS PCT: 1 %
Basophils Absolute: 0.1 10*3/uL (ref 0.0–0.1)
EOS ABS: 0.3 10*3/uL (ref 0.0–0.7)
EOS PCT: 5 %
HCT: 35.8 % — ABNORMAL LOW (ref 39.0–52.0)
HEMOGLOBIN: 11.2 g/dL — AB (ref 13.0–17.0)
Immature Granulocytes: 0 %
Lymphocytes Relative: 19 %
Lymphs Abs: 1.3 10*3/uL (ref 0.7–4.0)
MCH: 29 pg (ref 26.0–34.0)
MCHC: 31.3 g/dL (ref 30.0–36.0)
MCV: 92.7 fL (ref 78.0–100.0)
MONO ABS: 0.9 10*3/uL (ref 0.1–1.0)
Monocytes Relative: 13 %
Neutro Abs: 4.2 10*3/uL (ref 1.7–7.7)
Neutrophils Relative %: 62 %
PLATELETS: 306 10*3/uL (ref 150–400)
RBC: 3.86 MIL/uL — ABNORMAL LOW (ref 4.22–5.81)
RDW: 14.4 % (ref 11.5–15.5)
WBC: 6.8 10*3/uL (ref 4.0–10.5)

## 2018-01-09 LAB — COMPREHENSIVE METABOLIC PANEL
ALK PHOS: 74 U/L (ref 38–126)
ALT: 11 U/L (ref 0–44)
ANION GAP: 15 (ref 5–15)
AST: 15 U/L (ref 15–41)
Albumin: 3.9 g/dL (ref 3.5–5.0)
BUN: 45 mg/dL — ABNORMAL HIGH (ref 6–20)
CALCIUM: 9.4 mg/dL (ref 8.9–10.3)
CO2: 26 mmol/L (ref 22–32)
Chloride: 98 mmol/L (ref 98–111)
Creatinine, Ser: 17.52 mg/dL — ABNORMAL HIGH (ref 0.61–1.24)
GFR calc non Af Amer: 3 mL/min — ABNORMAL LOW (ref >60)
GFR, EST AFRICAN AMERICAN: 4 mL/min — AB (ref >60)
Glucose, Bld: 85 mg/dL (ref 70–99)
POTASSIUM: 6.8 mmol/L — AB (ref 3.5–5.1)
SODIUM: 139 mmol/L (ref 135–145)
TOTAL PROTEIN: 7.5 g/dL (ref 6.5–8.1)
Total Bilirubin: 0.7 mg/dL (ref 0.3–1.2)

## 2018-01-09 LAB — POTASSIUM: POTASSIUM: 6.2 mmol/L — AB (ref 3.5–5.1)

## 2018-01-09 LAB — LIPASE, BLOOD: Lipase: 32 U/L (ref 11–51)

## 2018-01-09 MED ORDER — ALBUTEROL SULFATE (2.5 MG/3ML) 0.083% IN NEBU
10.0000 mg | INHALATION_SOLUTION | Freq: Once | RESPIRATORY_TRACT | Status: DC
  Filled 2018-01-09: qty 12

## 2018-01-09 MED ORDER — CHLORHEXIDINE GLUCONATE CLOTH 2 % EX PADS: 6.0000 | MEDICATED_PAD | Freq: Every day | CUTANEOUS | Status: DC

## 2018-01-09 MED ORDER — ONDANSETRON HCL 4 MG/2ML IJ SOLN
4.0000 mg | Freq: Once | INTRAMUSCULAR | Status: AC
  Administered 2018-01-09: 4 mg via INTRAVENOUS
  Filled 2018-01-09: qty 2

## 2018-01-09 MED ORDER — ALBUTEROL (5 MG/ML) CONTINUOUS INHALATION SOLN
10.0000 mg/h | INHALATION_SOLUTION | RESPIRATORY_TRACT | Status: DC
  Administered 2018-01-09: 10 mg/h via RESPIRATORY_TRACT

## 2018-01-09 MED ORDER — DEXTROSE 50 % IV SOLN
1.0000 | Freq: Once | INTRAVENOUS | Status: AC
  Administered 2018-01-09: 50 mL via INTRAVENOUS
  Filled 2018-01-09: qty 50

## 2018-01-09 MED ORDER — LIDOCAINE-PRILOCAINE 2.5-2.5 % EX CREA
1.0000 "application " | TOPICAL_CREAM | CUTANEOUS | Status: DC | PRN
  Filled 2018-01-09: qty 5

## 2018-01-09 MED ORDER — HEPARIN SODIUM (PORCINE) 1000 UNIT/ML DIALYSIS
1000.0000 [IU] | INTRAMUSCULAR | Status: DC | PRN
  Filled 2018-01-09: qty 1

## 2018-01-09 MED ORDER — PENTAFLUOROPROP-TETRAFLUOROETH EX AERO
1.0000 "application " | INHALATION_SPRAY | CUTANEOUS | Status: DC | PRN
  Filled 2018-01-09: qty 30

## 2018-01-09 MED ORDER — MORPHINE SULFATE (PF) 4 MG/ML IV SOLN
4.0000 mg | Freq: Once | INTRAVENOUS | Status: AC
  Administered 2018-01-09: 4 mg via INTRAVENOUS
  Filled 2018-01-09: qty 1

## 2018-01-09 MED ORDER — INSULIN ASPART 100 UNIT/ML IV SOLN
5.0000 [IU] | Freq: Once | INTRAVENOUS | Status: AC
  Administered 2018-01-09: 5 [IU] via INTRAVENOUS
  Filled 2018-01-09: qty 0.05

## 2018-01-09 MED ORDER — SODIUM CHLORIDE 0.9 % IV SOLN: 100.0000 mL | INTRAVENOUS | Status: DC | PRN

## 2018-01-09 MED ORDER — LIDOCAINE HCL (PF) 1 % IJ SOLN: 5.0000 mL | INTRAMUSCULAR | Status: DC | PRN

## 2018-01-09 MED ORDER — SODIUM CHLORIDE 0.9 % IV BOLUS
500.0000 mL | Freq: Once | INTRAVENOUS | Status: AC
  Administered 2018-01-09: 500 mL via INTRAVENOUS

## 2018-01-09 MED ORDER — SODIUM BICARBONATE 8.4 % IV SOLN
50.0000 meq | Freq: Once | INTRAVENOUS | Status: AC
  Administered 2018-01-09: 50 meq via INTRAVENOUS
  Filled 2018-01-09: qty 50

## 2018-01-09 MED ORDER — HEPARIN SODIUM (PORCINE) 1000 UNIT/ML DIALYSIS
5000.0000 [IU] | INTRAMUSCULAR | Status: DC | PRN
  Filled 2018-01-09: qty 5

## 2018-01-09 MED ORDER — SODIUM POLYSTYRENE SULFONATE 15 GM/60ML PO SUSP
30.0000 g | Freq: Once | ORAL | Status: AC
  Administered 2018-01-09: 30 g via ORAL
  Filled 2018-01-09: qty 120

## 2018-01-09 NOTE — Progress Notes (Addendum)
Pt HD treatment terminated  At 1624. With no complaints Total hrs run 3.5 hrs.. When asked bout his lightheadedness. Pt stated "i'm ok" Pt alert /orientedx4 in no acute distress... And per order from PA . Pt to be discharged post HD treatment. Pt made aware right from the very start that he will be discharge post HD treatment. And pt acknowledged. In fact pt was seen by PA during HD treatment with no complaints. Post HD tx VSS T98.3,P82,,R18.BP 128/66.saturation 100% room air.  Pt has been asked several times if he has transport post HD treatment. Pt stated " I will call my friend once where downstairs in the  ED".  This writer accompanied the pt in stretcher with the transporter approximated 1700hrs. No complaints has been verbalized from the HD unit down to the ED main entrance where pt has been dropped off. Up and out from the stretcher with no issues. Seen walking out from the ED main entrance with no problem.

## 2018-01-09 NOTE — ED Notes (Signed)
Patient transported to X-ray 

## 2018-01-09 NOTE — Discharge Instructions (Addendum)
You were seen in the emergency department earlier today for multiple symptoms including weakness, nausea, vomiting, diarrhea, and back pain.  Your symptoms at that time were suspected to be due to elevated potassium level, this improved after dialysis.  Your work-up following your passing out episode was also reassuring.  Your potassium was normal.  Your renal function improved.  Your EKG was reassuring.  Your x-ray of your back did not show fractures or dislocations.  Would like you to follow-up closely with your primary care provider in the next 24 to 48 hours for reevaluation.  Return to the ER for new or worsening symptoms or any other concerns.

## 2018-01-09 NOTE — ED Notes (Signed)
ED Provider at bedside. 

## 2018-01-09 NOTE — ED Triage Notes (Signed)
Pt arrives by ems from home with reports of diarrhea and dizziness that started after his full dialysis treatment yesterday. Pt also reports back pain and nausea that started this am. Pt is alert and ox4, in no acute distress.

## 2018-01-09 NOTE — ED Provider Notes (Signed)
[facility] Provider Note   CSN: [id number] Arrival date & time: 01/09/18  [id number]     History   Chief Complaint Chief Complaint  Patient presents with  . Back Pain  . Diarrhea    HPI Jeremy Huerta is a 22 y.o. male with a history of ESRD (on hemodialysis T/R/Sat ), HTN, and prior syncope who returns to the ED with complaints of syncopal episode that happened this evening. Patient was seen in the emergency department this morning, brought in by EMS for multiple complalints over the past 2 to 3 days, increased after dialysis yesterday.  Reported lightheadedness, generalized weakness, DOE, lower back pain, and N/V/D. Seen by morning team in the ER for his sxs- found to be hyperkalemic- consultation with nephrology and taken for dialysis treatment. Following dialysis patient brought back to ER lobby for discharge by dialysis team- he reports that he was feeling better after dialysis, but was still feeling weak and upset with lack of paperwork. He got up from the stretcher and started to ambulate and felt generally weak and lightheaded. This increased as he ambulated and he felt near syncopal, fell somewhat forward and caught himself with door, did not fully syncope at that time. He reports staff tried to get him to come back inside, but he was frustrated and wanted to leave, he then had a likely syncope episode outside while ambulating. He felt lightheaded like he might pass out but does not recall exactly what happened. He does not think he hit his head, no headache. At present he feels back to baseline with the exception of some back discomfort which was occurring earlier today prior to fall. He has had similar syncope after dialysis, prior episodes were more severe than today's. At present he is requesting discharge as he is feeling much better. Denies dyspnea since dialysis. Denies chest pain. Denies numbness, tingling, or visual disturbances. Denies  incontinence to bowel/bladder, fever, chills, IV drug use, or hx of cancer. Patient has not had his anti-hypertensive medications today.    HPI  Past Medical History:  Diagnosis Date  . Acute respiratory failure (HCC)   . ESRD (end stage renal disease) (HCC)    TTS  . FSGS (focal segmental glomerulosclerosis)   . Hypertension   . Nausea & vomiting 11/2016  . Pneumonia 06/24/2017    Patient Active Problem List   Diagnosis Date Noted  . ESRD (end stage renal disease) on dialysis (HCC) 01/09/2018  . Syncope 09/16/2017  . HAP (hospital-acquired pneumonia) 06/24/2017  . Sepsis (HCC) 06/24/2017  . Abdominal pain 06/24/2017  . Vomiting 04/29/2017  . Skin abscess 03/07/2017  . 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 Back pain 03/05/2017  . Hypertensive emergency 01/13/2017  . History of anemia due to chronic kidney disease 01/13/2017  . Secondary hyperparathyroidism (HCC) 01/13/2017  . Bleeding from the nose 01/13/2017  . Fever 01/13/2017  . Chest pain on breathing   . SOB (shortness of breath)   . Gastroesophageal reflux disease   . Respiratory failure (HCC) 12/24/2016  . Acute pulmonary edema (HCC)   . End-stage renal disease on hemodialysis (HCC)   . Hypertensive urgency 12/02/2016  . Leukocytosis 12/02/2016  . Nausea & vomiting 12/02/2016  . Hyperkalemia 12/02/2016  . Acute respiratory failure (HCC) 04/22/2016  . Diarrhea 04/22/2016  . HCAP (healthcare-associated pneumonia) 04/22/2016  . Tachycardia 04/22/2016  . Hypertension     Past Surgical History:  Procedure Laterality Date  . AV FISTULA PLACEMENT  12/2015  . RENAL BIOPSY  2013        Home Medications    Prior to Admission medications   Medication Sig Start Date End Date Taking? Authorizing Provider  amLODipine (NORVASC) 10 MG tablet Take 1 tablet (10 mg total) by mouth daily. 09/18/17 01/09/18 Yes Shahmehdi, Gemma Payor, MD  B Complex-C-Folic Acid (B COMPLEX-VITAMIN C-FOLIC ACID) 1 MG tablet Take 1 tablet  by mouth daily. 04/30/16  Yes [provider]  cinacalcet (SENSIPAR) 90 MG tablet Take 90 mg by mouth daily. 10/01/17  Yes [provider]  cloNIDine (CATAPRES) 0.2 MG tablet Take 1 tablet (0.2 mg total) by mouth 3 (three) times daily. 09/18/17 01/09/18 Yes Shahmehdi, Seyed A, MD  fluticasone (FLONASE) 50 MCG/ACT nasal spray Place 1 spray daily into both nostrils. Patient taking differently: Place 1 spray into both nostrils daily as needed for allergies or rhinitis.  04/05/17  Yes Ollis, Brandi L, NP  hydrALAZINE (APRESOLINE) 100 MG tablet Take 1 tablet (100 mg total) by mouth 3 (three) times daily. 09/18/17 01/09/18 Yes Shahmehdi, Seyed A, MD  labetalol (NORMODYNE) 200 MG tablet Take 1 tablet (200 mg total) by mouth 3 (three) times daily. 09/18/17 01/09/18 Yes Shahmehdi, Seyed A, MD  sevelamer (RENAGEL) 800 MG tablet Take 800-3,200 mg by mouth See admin instructions. 2,400-3,200 mg three times a day with meals and 800 mg with each snack   Yes [provider]  isosorbide mononitrate (IMDUR) 30 MG 24 hr tablet Take 3 tablets (90 mg total) by mouth daily. Patient not taking: Reported on 01/09/2018 09/19/17 11/25/17  Kendell Bane, MD    Family History Family History  Problem Relation Age of Onset  . Hypertension Mother   . Diabetes Father   . Hypertension Father   . Kidney disease Father   . Hypertension Maternal Grandmother   . Diabetes Paternal Grandmother   . Hypertension Paternal Grandmother     Social History Social History   Tobacco Use  . Smoking status: Never Smoker  . Smokeless tobacco: Never Used  Substance Use Topics  . Alcohol use: No  . Drug use: No     Allergies   Patient has no known allergies.   Review of Systems Review of Systems  All other systems reviewed and are negative.    Physical Exam Updated Vital Signs BP (!) 166/97   Pulse 91   Temp 98.3 F (36.8 C) (Oral)   Resp 20   Wt 89 kg Comment: standing wt.  SpO2 97%   BMI 26.61  kg/m   Physical Exam  Constitutional: He appears well-developed and well-nourished.  Non-toxic appearance. No distress.  HENT:  Head: Normocephalic and atraumatic. Head is without raccoon's eyes and without Battle's sign.  Right Ear: No hemotympanum.  Left Ear: No hemotympanum.  Nose: Nose normal.  Mouth/Throat: Uvula is midline and oropharynx is clear and moist.  No palpable facial deformities or areas of tenderness to palpation.  Eyes: Pupils are equal, round, and reactive to light. Conjunctivae and EOM are normal. Right eye exhibits no discharge. Left eye exhibits no discharge.  Neck: Neck supple. No spinous process tenderness present. No edema present.  Cardiovascular: Normal rate and regular rhythm.  Pulses:      Radial pulses are 2+ on the right side, and 2+ on the left side.       Dorsalis pedis pulses are 2+ on the right side, and 2+ on the left side.  Pulmonary/Chest: Effort normal and breath sounds normal. No  respiratory distress. He has no wheezes. He has no rhonchi. He has no rales.  Respiration even and unlabored  Abdominal: Soft. Bowel sounds are normal. He exhibits no distension. There is no tenderness.  Musculoskeletal: He exhibits no edema.  No obvious deformity, appreciable swelling, erythema, ecchymosis, or open wounds. Back: Patient diffusely tender to lumbar region including midline and bilateral paraspinal muscles, no point/focal vertebral tenderness.  No palpable step-off.  No thoracic or cervical tenderness to palpation. Extremities: Normal range of motion.  Nontender. L AV fistula in place with palpable thrill.   Neurological: He is alert.  Clear speech.  CN III through XII grossly intact.  5 out of 5 symmetric grip strength.  5 out of 5 strength with hip flexion/extension as well as ankle plantar/dorsiflexion bilaterally.  Sensation grossly intact x4.  Negative pronator drift.  Ambulatory with steady gait.  DTRs are 2+ and symmetric.  Skin: Skin is warm and dry.  No rash noted.  Psychiatric: He has a normal mood and affect. His behavior is normal.  Nursing note and vitals reviewed.  ED Treatments / Results  Labs (all labs ordered are listed, but only abnormal results are displayed) Labs Reviewed  CBC WITH DIFFERENTIAL/PLATELET - Abnormal; Notable for the following components:      Result Value   RBC 3.86 (*)    Hemoglobin 11.2 (*)    HCT 35.8 (*)    All other components within normal limits  COMPREHENSIVE METABOLIC PANEL - Abnormal; Notable for the following components:   Potassium 6.8 (*)    BUN 45 (*)    Creatinine, Ser 17.52 (*)    GFR calc non Af Amer 3 (*)    GFR calc Af Amer 4 (*)    All other components within normal limits  POTASSIUM - Abnormal; Notable for the following components:   Potassium 6.2 (*)    All other components within normal limits  BASIC METABOLIC PANEL - Abnormal; Notable for the following components:   Chloride 94 (*)    BUN 23 (*)    Creatinine, Ser 11.38 (*)    GFR calc non Af Amer 6 (*)    GFR calc Af Amer 6 (*)    Anion gap 16 (*)    All other components within normal limits  LIPASE, BLOOD    EKG EKG Interpretation  Date/Time:  Friday January 09 2018 17:53:00 EDT Ventricular Rate:  95 PR Interval:  156 QRS Duration: 66 QT Interval:  360 QTC Calculation: 452 R Axis:   71 Text Interpretation:  Normal sinus rhythm Nonspecific T wave abnormality Abnormal ECG Confirmed by Raeford Razor [phone]) on 01/09/2018 10:51:57 PM   Radiology Dg Lumbar Spine Complete  Result Date: 01/09/2018 CLINICAL DATA:  Chronic low back pain worsening today after fall. EXAM: LUMBAR SPINE - COMPLETE 4+ VIEW COMPARISON:  03/04/2017 FINDINGS: There are 5 non ribbed lumbar vertebrae in maintained lumbar lordosis and slight levoconvex scoliosis. Slight disc space narrowing suggested from L3 through S1 some of which is due to the projection and slight scoliosis. Vertebral body heights are maintained. No pars defects or listhesis.  Mild physiologic anterior wedging of T10 and T11, stable in appearance. IMPRESSION: No acute osseous abnormality of the lumbar spine. Slight disc space narrowing L3 through S1 some which is due to mild levoconvex curvature of the lower lumbar spine. Electronically Signed   By: Tollie Eth M.D.   On: 01/09/2018 23:40    Procedures Procedures (including critical care time)  Medications Ordered in  ED Medications  albuterol (PROVENTIL,VENTOLIN) solution continuous neb (10 mg/hr Nebulization New Bag/Given 01/09/18 1044)  Chlorhexidine Gluconate Cloth 2 % PADS 6 each (has no administration in time range)  pentafluoroprop-tetrafluoroeth (GEBAUERS) aerosol 1 application (has no administration in time range)  lidocaine (PF) (XYLOCAINE) 1 % injection 5 mL (has no administration in time range)  lidocaine-prilocaine (EMLA) cream 1 application (has no administration in time range)  0.9 %  sodium chloride infusion (has no administration in time range)  0.9 %  sodium chloride infusion (has no administration in time range)  heparin injection 1,000 Units (has no administration in time range)  heparin injection 5,000 Units (has no administration in time range)  sodium chloride 0.9 % bolus 500 mL (0 mLs Intravenous Stopped 01/09/18 1051)  ondansetron (ZOFRAN) injection 4 mg (4 mg Intravenous Given 01/09/18 0942)  morphine 4 MG/ML injection 4 mg (4 mg Intravenous Given 01/09/18 0949)  insulin aspart (novoLOG) injection 5 Units (5 Units Intravenous Given 01/09/18 1111)  dextrose 50 % solution 50 mL (50 mLs Intravenous Given 01/09/18 1050)  sodium bicarbonate injection 50 mEq (50 mEq Intravenous Given 01/09/18 1050)  sodium polystyrene (KAYEXALATE) 15 GM/60ML suspension 30 g (30 g Oral Given 01/09/18 1050)  morphine 4 MG/ML injection 4 mg (4 mg Intravenous Given 01/09/18 1206)    Initial Impression / Assessment and Plan / ED Course  I have reviewed the triage vital signs and the nursing notes.  Pertinent labs &  imaging results that were available during my care of the patient were reviewed by me and considered in my medical decision making (see chart for details).    Patient returns to the emergency department after near syncopal and subsequent syncopal episodes this evening.  Patient seen earlier in the ER for multiple complaints, found to be hyperkalemic with peaked T waves, taken to dialysis.  Upon discharge patient felt weak and lightheaded with transition from sitting to standing and with ambulation resulting in near syncope/syncopal episodes, states he did not necessarily feel ready to leave at that time.  Patient without chest pain or shortness of breath related to syncope.  He currently feels back to baseline with the exception of some lower back discomfort which has been present throughout the day today.  No back pain red flags.   X-ray lumbar spine negative for fracture or dislocations.  No evidence of serious head, neck, or back injury status post syncope. Repeat basic metabolic panel reassuring, renal function as well as potassium improved. Repeat EKG improved. Monitor reviewed without significant arrhythmias. Patient hypertensive, but suspect this to be due to not having anti-hypertensive medications, doubt HTN emergency, he does have an SpO2 reading of 63%- unclear if accurate, doubtful, has been with SpO2 > 95%, appears hemodynamically stable and safe for DC home with close PCP follow up. I discussed results, treatment plan, need for PCP follow-up, and return precautions with the patient. Provided opportunity for questions, patient confirmed understanding and is in agreement with plan.   Findings and plan of care discussed with supervising physician Dr. Juleen China who is in agreement with plan.   Final Clinical Impressions(s) / ED Diagnoses   Final diagnoses:  Dialysis patient [facility])  Syncope, unspecified syncope type    ED Discharge Orders    None       Cherly Anderson, PA-C 01/10/18  0736    Raeford Razor, MD 01/15/18 1426

## 2018-01-09 NOTE — ED Provider Notes (Signed)
Patient placed in Quick Look pathway, seen and evaluated   Chief Complaint: Near syncope event after discharge  HPI:   Patient was seen here earlier for high potassium and was discharged after dialysis.  I was asked to see patient after he had a near syncope event in the lobby.  He reports he doesn't remember that.  He will not answer more questions other than sayign that his chest hurts and he wants to call his mother.   ROS: No frank shortness of breath.   Physical Exam:   Gen: No distress  Neuro: Awake and Alert  Skin: Warm    Focused Exam: Tearful, angry, agitated.    Initiation of care has begun. The patient has been counseled on the process, plan, and necessity for staying for the completion/evaluation, and the remainder of the medical screening examination     Norman ClayHammond, Krysti Hickling W, PA-C 01/09/18 1752    Raeford RazorKohut, Stephen, MD 01/15/18 1426

## 2018-01-09 NOTE — ED Notes (Signed)
Pt was leaving through lobby when I heard a sudden loud "thud" and saw pt on all 4 and the sliding door slightly out of place.  Went to help pt, pt initially did not want to check in, helped him to a wheelchair and tissues were given.  Pt tearful and seems frustrated,  Pt denies any pain or injury. Pt states that he was "discharged without paperwork even though I am feeling dizzy"  Attempted to take pt to [facility] to be evaluated by provider.  When I brought pt to be checked in he got up out of wheelchair and walked away.

## 2018-01-09 NOTE — ED Triage Notes (Signed)
Patient is visibly upset during reassessment. He states he was still feeling lightheaded and not well when dialysis discharged him. Patient was not taken out of the system. He states, "they brought me down here in a stretcher and told me I could go, that I was being discharged. They didn't ask me if I had transportation and I don't know why they wouldn't listen when I told them I didn't feel well. I fell out when I was leaving, I don't even remember that." Patient tearful, talking to family over the phone. He does continue to endorse lightheadedness that is worse with walking.

## 2018-01-09 NOTE — Progress Notes (Addendum)
Pt arrived in strecher from ED approximted 1225 accompanied by NT for HD tx d/t hyperkalemia.  Pt alert/oriented x4 in no acute distress. Reports received from Daisy Lazarindy Marshall RN in ED.approximated 1215pm . Pt stable for HD. And per reports pt to be discharged post HD tx. Ordered and Confirmed by PA., Consent for HD tx signed with no questions..Discussed with pt possible signs and symptoms of adverse reactions during HD treatment as well as discharge post HD treatment.. Pt  Verbalized understanding.Hemodialysis treatment initiated at 1254 with no signs and symptoms of adverse reactions noted, VSS( see flowsheets for details).

## 2018-01-09 NOTE — ED Provider Notes (Signed)
[facility] EMERGENCY DEPARTMENT Provider Note   CSN: [id number] Arrival date & time: 01/09/18  4098     History   Chief Complaint Chief Complaint  Patient presents with  . Back Pain    HPI Jeremy Huerta is a 22 y.o. male.  The history is provided by the patient and medical records. No language interpreter was used.  Back Pain    Diarrhea       22 year old male with history of end-stage renal disease currently on Tuesday Thursday Saturday dialysis, hypertension brought here via EMS for evaluation of dizziness.  Patient mention for the past 2 to 3 days he has had sensation of lightheadedness and dizziness.  Yesterday he finishes for dialysis session and afterward he report increase lightheadedness and dizziness.  He endorsed generalized weakness as well as having achy pain throughout his lower back, has been feeling nauseous, has vomited twice as well as having several bouts of loose stools.  He does not make urine.  He denies any recent sick contact or any recent travel.  He does endorse shortness of breath with exertion but denies any prior history of PE DVT, no recent surgery, prolonged bedrest, active cancer, or recent travel.  No specific treatment tried at home.  He has been going through dialysis for the past 2 years.  Does report increased sneezing.  Past Medical History:  Diagnosis Date  . Acute respiratory failure (HCC)   . ESRD (end stage renal disease) (HCC)    TTS  . FSGS (focal segmental glomerulosclerosis)   . Hypertension   . Nausea & vomiting 11/2016  . Pneumonia 06/24/2017    Patient Active Problem List   Diagnosis Date Noted  . Syncope 09/16/2017  . HAP (hospital-acquired pneumonia) 06/24/2017  . Sepsis (HCC) 06/24/2017  . Abdominal pain 06/24/2017  . Vomiting 04/29/2017  . Skin abscess 03/07/2017  . 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 Back pain 03/05/2017  . Hypertensive emergency 01/13/2017  . History of anemia due to  chronic kidney disease 01/13/2017  . Secondary hyperparathyroidism (HCC) 01/13/2017  . Bleeding from the nose 01/13/2017  . Fever 01/13/2017  . Chest pain on breathing   . SOB (shortness of breath)   . Gastroesophageal reflux disease   . Respiratory failure (HCC) 12/24/2016  . Acute pulmonary edema (HCC)   . End-stage renal disease on hemodialysis (HCC)   . Hypertensive urgency 12/02/2016  . Leukocytosis 12/02/2016  . Nausea & vomiting 12/02/2016  . Hyperkalemia 12/02/2016  . Acute respiratory failure (HCC) 04/22/2016  . Diarrhea 04/22/2016  . HCAP (healthcare-associated pneumonia) 04/22/2016  . Tachycardia 04/22/2016  . Hypertension     Past Surgical History:  Procedure Laterality Date  . AV FISTULA PLACEMENT  12/2015  . RENAL BIOPSY  2013        Home Medications    Prior to Admission medications   Medication Sig Start Date End Date Taking? Authorizing Provider  amLODipine (NORVASC) 10 MG tablet Take 1 tablet (10 mg total) by mouth daily. 09/18/17 11/25/17  Shahmehdi, Gemma Payor, MD  B Complex-C-Folic Acid (B COMPLEX-VITAMIN C-FOLIC ACID) 1 MG tablet Take 1 tablet by mouth daily. 04/30/16   [provider]  cinacalcet (SENSIPAR) 90 MG tablet Take 90 mg by mouth daily. 10/01/17   [provider]  cloNIDine (CATAPRES) 0.2 MG tablet Take 1 tablet (0.2 mg total) by mouth 3 (three) times daily. 09/18/17 11/25/17  Shahmehdi, Gemma Payor, MD  fluticasone (FLONASE) 50 MCG/ACT nasal spray Place  1 spray daily into both nostrils. Patient taking differently: Place 1 spray into both nostrils daily as needed for allergies or rhinitis.  04/05/17   Jeanella Craze, NP  hydrALAZINE (APRESOLINE) 100 MG tablet Take 1 tablet (100 mg total) by mouth 3 (three) times daily. 09/18/17 11/25/17  Kendell Bane, MD  isosorbide mononitrate (IMDUR) 30 MG 24 hr tablet Take 3 tablets (90 mg total) by mouth daily. 09/19/17 11/25/17  Shahmehdi, Gemma Payor, MD  labetalol (NORMODYNE) 200 MG tablet Take 1 tablet  (200 mg total) by mouth 3 (three) times daily. 09/18/17 11/25/17  Kendell Bane, MD  sevelamer (RENAGEL) 800 MG tablet Take 800-3,200 mg by mouth See admin instructions. 2,400-3,200 mg three times a day with meals and 800 mg with each snack    [provider]    Family History Family History  Problem Relation Age of Onset  . Hypertension Mother   . Diabetes Father   . Hypertension Father   . Kidney disease Father   . Hypertension Maternal Grandmother   . Diabetes Paternal Grandmother   . Hypertension Paternal Grandmother     Social History Social History   Tobacco Use  . Smoking status: Never Smoker  . Smokeless tobacco: Never Used  Substance Use Topics  . Alcohol use: No  . Drug use: No     Allergies   Patient has no known allergies.   Review of Systems Review of Systems  Gastrointestinal: Positive for diarrhea.  Musculoskeletal: Positive for back pain.  All other systems reviewed and are negative.    Physical Exam Updated Vital Signs BP (!) 163/95   Pulse 71   Temp 98.5 F (36.9 C)   Resp 12   SpO2 99%   Physical Exam  Constitutional: He is oriented to person, place, and time. He appears well-developed and well-nourished. No distress.  HENT:  Head: Atraumatic.  Right Ear: External ear normal.  Left Ear: External ear normal.  Mouth/Throat: Oropharynx is clear and moist.  Eyes: Conjunctivae are normal.  Neck: Neck supple.  Cardiovascular: Normal rate and regular rhythm.  Pulmonary/Chest: Effort normal and breath sounds normal.  Abdominal: Soft. He exhibits no distension. There is tenderness (Mild epigastric tenderness without guarding or rebound tenderness.).  Musculoskeletal: He exhibits tenderness (Tenderness throughout no back without focal point tenderness or midline tenderness.). He exhibits no edema.  Neurological: He is alert and oriented to person, place, and time.  Skin: No rash noted.  Psychiatric: He has a normal mood and affect.    Nursing note and vitals reviewed.    ED Treatments / Results  Labs (all labs ordered are listed, but only abnormal results are displayed) Labs Reviewed  CBC WITH DIFFERENTIAL/PLATELET - Abnormal; Notable for the following components:      Result Value   RBC 3.86 (*)    Hemoglobin 11.2 (*)    HCT 35.8 (*)    All other components within normal limits  COMPREHENSIVE METABOLIC PANEL - Abnormal; Notable for the following components:   Potassium 6.8 (*)    BUN 45 (*)    Creatinine, Ser 17.52 (*)    GFR calc non Af Amer 3 (*)    GFR calc Af Amer 4 (*)    All other components within normal limits  LIPASE, BLOOD  POTASSIUM    EKG None  ED ECG REPORT   Date: 01/09/2018  Rate: 71  Rhythm: normal sinus rhythm  QRS Axis: right  Intervals: normal  ST/T Wave abnormalities: normal  Conduction Disutrbances:none  Narrative Interpretation: peaked T waves  Old EKG Reviewed: unchanged  I have personally reviewed the EKG tracing and agree with the computerized printout as noted.   Radiology No results found.  Procedures .Critical Care Performed by: Fayrene Helper, PA-C Authorized by: Fayrene Helper, PA-C   Critical care provider statement:    Critical care time (minutes):  45   Critical care was time spent personally by me on the following activities:  Discussions with consultants, evaluation of patient's response to treatment, examination of patient, ordering and performing treatments and interventions, ordering and review of laboratory studies, ordering and review of radiographic studies, pulse oximetry, re-evaluation of patient's condition, obtaining history from patient or surrogate and review of old charts   (including critical care time)  Medications Ordered in ED Medications  albuterol (PROVENTIL,VENTOLIN) solution continuous neb (10 mg/hr Nebulization New Bag/Given 01/09/18 1044)  Chlorhexidine Gluconate Cloth 2 % PADS 6 each (has no administration in time range)   pentafluoroprop-tetrafluoroeth (GEBAUERS) aerosol 1 application (has no administration in time range)  lidocaine (PF) (XYLOCAINE) 1 % injection 5 mL (has no administration in time range)  lidocaine-prilocaine (EMLA) cream 1 application (has no administration in time range)  0.9 %  sodium chloride infusion (has no administration in time range)  0.9 %  sodium chloride infusion (has no administration in time range)  heparin injection 1,000 Units (has no administration in time range)  heparin injection 5,000 Units (has no administration in time range)  sodium chloride 0.9 % bolus 500 mL (0 mLs Intravenous Stopped 01/09/18 1051)  ondansetron (ZOFRAN) injection 4 mg (4 mg Intravenous Given 01/09/18 0942)  morphine 4 MG/ML injection 4 mg (4 mg Intravenous Given 01/09/18 0949)  insulin aspart (novoLOG) injection 5 Units (5 Units Intravenous Given 01/09/18 1111)  dextrose 50 % solution 50 mL (50 mLs Intravenous Given 01/09/18 1050)  sodium bicarbonate injection 50 mEq (50 mEq Intravenous Given 01/09/18 1050)  sodium polystyrene (KAYEXALATE) 15 GM/60ML suspension 30 g (30 g Oral Given 01/09/18 1050)  morphine 4 MG/ML injection 4 mg (4 mg Intravenous Given 01/09/18 1206)     Initial Impression / Assessment and Plan / ED Course  I have reviewed the triage vital signs and the nursing notes.  Pertinent labs & imaging results that were available during my care of the patient were reviewed by me and considered in my medical decision making (see chart for details).     BP (!) (P) 143/75   Pulse (P) 80   Temp 98.2 F (36.8 C) (Oral)   Resp (!) (P) 24   Wt 92 kg Comment: standing wt.  SpO2 100%   BMI 27.51 kg/m    Final Clinical Impressions(s) / ED Diagnoses   Final diagnoses:  Nausea vomiting and diarrhea  Hyperkalemia  Dialysis patient [facility])    ED Discharge Orders    None     9:23 AM Patient here with generalized body aches, lightheadedness, dizziness and back pain.  He is a dialysis  patient.  He does report some shortness of breath.  He is PERC negative therefore low suspicion for PE.  Work-up initiated.  10:34 AM Labs remarkable for hypokalemia with a potassium of 6.8 on a nonhemolyzed blood.  BUN and creatinine are elevated as well at 45, and 17.52 respectively.  EKG with peaked T waves.  I discussed this with Dr. Pilar Plate. Will treat hyperkalemia with albuterol, dextrose, insuline, sodium bicar, and kayexalate.  Will recheck K+ to verify.    Patient mention  he did not miss any dialysis session, last session was yesterday and lasted for 4 hours and 15 minutes.  He goes to [facility]. Nephrologist is Dr. Dunham.   11:36 AM Appreciate consultation fromEliott Nine on-call nephrologist, Dr. Vida RollerSchuetz, who agrees to see patient in the ER and likely will have patient dialyzed today to reduce his potassium load.  2:05 PM Pt brought to dialysis center to be dialyzed.  Likely discharge afterward.  His n/v/d may also contribute to his hyperkalemia state.  Repeat K+ is 6.2   Fayrene Helperran, Hiliana Eilts, PA-C 01/09/18 1405    Sabas SousBero, Michael M, MD 01/09/18 [phone]

## 2018-01-09 NOTE — ED Notes (Signed)
Dialysis informed this Rn that the pt finished his dialysis and needs to be taken out of the system.

## 2018-01-09 NOTE — ED Notes (Signed)
Security was called that there is someone laying on the sidewalk outside of ED.  Pt was found laying on sidewalk.  Pt crying.  Helped pt up into wheelchair.  No physical injuries visible at this time.  Pt brought back to triage, spoke with triage RN and charge RN regarding pt.  Pt denies any CP or sob at this time.  Denies any syncope.  Pt helped to triage room 3 to have new triage assessment

## 2018-01-10 NOTE — ED Notes (Signed)
Pt verbalizes understanding of d/c instructions. Pt ambulatory at d/c with all belongings.   

## 2018-01-12 LAB — CBG MONITORING, ED: GLUCOSE-CAPILLARY: 103 mg/dL — AB (ref 70–99)

## 2018-01-13 ENCOUNTER — Non-Acute Institutional Stay (HOSPITAL_COMMUNITY)
Admission: EM | Admit: 2018-01-13 | Discharge: 2018-01-13 | Disposition: A | Discharge status: Home / Self Care | Payer: Medicaid Other | Attending: Emergency Medicine | Admitting: Emergency Medicine

## 2018-01-13 ENCOUNTER — Other Ambulatory Visit: Payer: Self-pay

## 2018-01-13 ENCOUNTER — Encounter (HOSPITAL_COMMUNITY): Payer: Self-pay | Admitting: Emergency Medicine

## 2018-01-13 DIAGNOSIS — N186 End stage renal disease: Secondary | ICD-10-CM | POA: Insufficient documentation

## 2018-01-13 DIAGNOSIS — Z992 Dependence on renal dialysis: Secondary | ICD-10-CM | POA: Insufficient documentation

## 2018-01-13 DIAGNOSIS — R112 Nausea with vomiting, unspecified: Secondary | ICD-10-CM | POA: Insufficient documentation

## 2018-01-13 DIAGNOSIS — I120 Hypertensive chronic kidney disease with stage 5 chronic kidney disease or end stage renal disease: Secondary | ICD-10-CM | POA: Diagnosis not present

## 2018-01-13 DIAGNOSIS — E875 Hyperkalemia: Secondary | ICD-10-CM | POA: Diagnosis present

## 2018-01-13 DIAGNOSIS — N2581 Secondary hyperparathyroidism of renal origin: Secondary | ICD-10-CM | POA: Diagnosis not present

## 2018-01-13 DIAGNOSIS — R748 Abnormal levels of other serum enzymes: Secondary | ICD-10-CM | POA: Diagnosis not present

## 2018-01-13 DIAGNOSIS — R1010 Upper abdominal pain, unspecified: Secondary | ICD-10-CM | POA: Insufficient documentation

## 2018-01-13 DIAGNOSIS — Z79899 Other long term (current) drug therapy: Secondary | ICD-10-CM | POA: Diagnosis not present

## 2018-01-13 LAB — CBC WITH DIFFERENTIAL/PLATELET
ABS IMMATURE GRANULOCYTES: 0 10*3/uL (ref 0.0–0.1)
BASOS ABS: 0.1 10*3/uL (ref 0.0–0.1)
BASOS PCT: 1 %
EOS PCT: 5 %
Eosinophils Absolute: 0.4 10*3/uL (ref 0.0–0.7)
HCT: 30.2 % — ABNORMAL LOW (ref 39.0–52.0)
Hemoglobin: 9.3 g/dL — ABNORMAL LOW (ref 13.0–17.0)
Immature Granulocytes: 0 %
Lymphocytes Relative: 22 %
Lymphs Abs: 1.5 10*3/uL (ref 0.7–4.0)
MCH: 29.2 pg (ref 26.0–34.0)
MCHC: 30.8 g/dL (ref 30.0–36.0)
MCV: 94.7 fL (ref 78.0–100.0)
MONO ABS: 0.7 10*3/uL (ref 0.1–1.0)
Monocytes Relative: 10 %
NEUTROS ABS: 4.1 10*3/uL (ref 1.7–7.7)
Neutrophils Relative %: 62 %
Platelets: 226 10*3/uL (ref 150–400)
RBC: 3.19 MIL/uL — ABNORMAL LOW (ref 4.22–5.81)
RDW: 14.7 % (ref 11.5–15.5)
WBC: 6.7 10*3/uL (ref 4.0–10.5)

## 2018-01-13 LAB — COMPREHENSIVE METABOLIC PANEL
ALT: 8 U/L (ref 0–44)
ANION GAP: 15 (ref 5–15)
AST: 12 U/L — ABNORMAL LOW (ref 15–41)
Albumin: 3.7 g/dL (ref 3.5–5.0)
Alkaline Phosphatase: 63 U/L (ref 38–126)
BUN: 67 mg/dL — ABNORMAL HIGH (ref 6–20)
CHLORIDE: 103 mmol/L (ref 98–111)
CO2: 23 mmol/L (ref 22–32)
Calcium: 9 mg/dL (ref 8.9–10.3)
Creatinine, Ser: 22.33 mg/dL — ABNORMAL HIGH (ref 0.61–1.24)
GFR calc non Af Amer: 2 mL/min — ABNORMAL LOW (ref >60)
GFR, EST AFRICAN AMERICAN: 3 mL/min — AB (ref >60)
Glucose, Bld: 79 mg/dL (ref 70–99)
POTASSIUM: 6.3 mmol/L — AB (ref 3.5–5.1)
SODIUM: 141 mmol/L (ref 135–145)
Total Bilirubin: 0.8 mg/dL (ref 0.3–1.2)
Total Protein: 6.9 g/dL (ref 6.5–8.1)

## 2018-01-13 LAB — LIPASE, BLOOD: Lipase: 155 U/L — ABNORMAL HIGH (ref 11–51)

## 2018-01-13 MED ORDER — ONDANSETRON 4 MG PO TBDP
4.0000 mg | ORAL_TABLET | Freq: Once | ORAL | Status: AC
  Administered 2018-01-13: 4 mg via ORAL
  Filled 2018-01-13: qty 1

## 2018-01-13 MED ORDER — ACETAMINOPHEN 325 MG PO TABS
650.0000 mg | ORAL_TABLET | Freq: Once | ORAL | Status: AC
  Administered 2018-01-13: 650 mg via ORAL
  Filled 2018-01-13: qty 2

## 2018-01-13 MED ORDER — CHLORHEXIDINE GLUCONATE CLOTH 2 % EX PADS: 6.0000 | MEDICATED_PAD | Freq: Every day | CUTANEOUS | Status: DC

## 2018-01-13 NOTE — ED Provider Notes (Signed)
[facility] EMERGENCY DEPARTMENT Provider Note   CSN: [id number] Arrival date & time: 01/13/18  1002     History   Chief Complaint Chief Complaint  Patient presents with  . Nausea  . Weakness    HPI Jeremy Huerta is a 22 y.o. male.  Patient with history of end-stage renal disease, on hemodialysis for approximately 3 years, nephrologist Dr. Eliott Nine, dialyzes at [facility] --presents the emergency department with complaint of nausea, vomiting, and lightheadedness.  Symptoms started last night.  Patient vomited twice.  Patient last dialyzed in the hospital on Friday (4 days ago).  He was supposed to go today but did not feel well enough to go.  Patient has had some upper abdominal pain associated with the vomiting.  He has had back pain that is been chronic for multiple months.  No fevers.  Patient does not make urine.  No diarrhea or constipation.  No treatments prior to arrival.  Onset of symptoms acute.  Course is improving.  Nothing the symptoms better or worse.     Past Medical History:  Diagnosis Date  . Acute respiratory failure (HCC)   . ESRD (end stage renal disease) (HCC)    TTS  . FSGS (focal segmental glomerulosclerosis)   . Hypertension   . Nausea & vomiting 11/2016  . Pneumonia 06/24/2017    Patient Active Problem List   Diagnosis Date Noted  . ESRD (end stage renal disease) on dialysis (HCC) 01/09/2018  . Syncope 09/16/2017  . HAP (hospital-acquired pneumonia) 06/24/2017  . Sepsis (HCC) 06/24/2017  . Abdominal pain 06/24/2017  . Vomiting 04/29/2017  . Skin abscess 03/07/2017  . 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 Back pain 03/05/2017  . Hypertensive emergency 01/13/2017  . History of anemia due to chronic kidney disease 01/13/2017  . Secondary hyperparathyroidism (HCC) 01/13/2017  . Bleeding from the nose 01/13/2017  . Fever 01/13/2017  . Chest pain on breathing   . SOB (shortness of breath)   . Gastroesophageal reflux  disease   . Respiratory failure (HCC) 12/24/2016  . Acute pulmonary edema (HCC)   . End-stage renal disease on hemodialysis (HCC)   . Hypertensive urgency 12/02/2016  . Leukocytosis 12/02/2016  . Nausea & vomiting 12/02/2016  . Hyperkalemia 12/02/2016  . Acute respiratory failure (HCC) 04/22/2016  . Diarrhea 04/22/2016  . HCAP (healthcare-associated pneumonia) 04/22/2016  . Tachycardia 04/22/2016  . Hypertension     Past Surgical History:  Procedure Laterality Date  . AV FISTULA PLACEMENT  12/2015  . RENAL BIOPSY  2013        Home Medications    Prior to Admission medications   Medication Sig Start Date End Date Taking? Authorizing Provider  amLODipine (NORVASC) 10 MG tablet Take 1 tablet (10 mg total) by mouth daily. 09/18/17 01/13/18 Yes Shahmehdi, Gemma Payor, MD  B Complex-C-Folic Acid (B COMPLEX-VITAMIN C-FOLIC ACID) 1 MG tablet Take 1 tablet by mouth daily. 04/30/16  Yes [provider]  cinacalcet (SENSIPAR) 90 MG tablet Take 90 mg by mouth daily. 10/01/17  Yes [provider]  cloNIDine (CATAPRES) 0.2 MG tablet Take 1 tablet (0.2 mg total) by mouth 3 (three) times daily. 09/18/17 01/13/18 Yes Shahmehdi, Seyed A, MD  hydrALAZINE (APRESOLINE) 100 MG tablet Take 1 tablet (100 mg total) by mouth 3 (three) times daily. 09/18/17 01/13/18 Yes Shahmehdi, Seyed A, MD  labetalol (NORMODYNE) 200 MG tablet Take 1 tablet (200 mg total) by mouth 3 (three) times daily. 09/18/17 01/13/18 Yes Shahmehdi,  Seyed A, MD  sevelamer (RENAGEL) 800 MG tablet Take 800-3,200 mg by mouth See admin instructions. 2,400-3,200 mg three times a day with meals and 800 mg with each snack   Yes [provider]  fluticasone (FLONASE) 50 MCG/ACT nasal spray Place 1 spray daily into both nostrils. Patient not taking: Reported on 01/13/2018 04/05/17   Jeanella Crazellis, Brandi L, NP  isosorbide mononitrate (IMDUR) 30 MG 24 hr tablet Take 3 tablets (90 mg total) by mouth daily. Patient not taking: Reported on  01/09/2018 09/19/17 11/25/17  Kendell BaneShahmehdi, Seyed A, MD    Family History Family History  Problem Relation Age of Onset  . Hypertension Mother   . Diabetes Father   . Hypertension Father   . Kidney disease Father   . Hypertension Maternal Grandmother   . Diabetes Paternal Grandmother   . Hypertension Paternal Grandmother     Social History Social History   Tobacco Use  . Smoking status: Never Smoker  . Smokeless tobacco: Never Used  Substance Use Topics  . Alcohol use: No  . Drug use: No     Allergies   Patient has no known allergies.   Review of Systems Review of Systems  Constitutional: Negative for fever.  HENT: Negative for rhinorrhea and sore throat.   Eyes: Negative for redness.  Respiratory: Negative for cough.   Cardiovascular: Negative for chest pain.  Gastrointestinal: Positive for abdominal pain, nausea and vomiting. Negative for diarrhea.  Genitourinary: Negative for dysuria.  Musculoskeletal: Positive for back pain. Negative for myalgias.  Skin: Negative for rash.  Neurological: Negative for headaches.    Physical Exam Updated Vital Signs BP (!) 175/116   Pulse 72   Resp 17   SpO2 96%   Physical Exam  Constitutional: He appears well-developed and well-nourished.  Patient in no distress.  HENT:  Head: Normocephalic and atraumatic.  Mouth/Throat: Oropharynx is clear and moist.  Eyes: Conjunctivae are normal. Right eye exhibits no discharge. Left eye exhibits no discharge.  Neck: Normal range of motion. Neck supple.  Cardiovascular: Normal rate, regular rhythm and normal heart sounds.  Pulmonary/Chest: Effort normal and breath sounds normal.  Abdominal: Soft. There is tenderness (Diffuse, mild). There is no rebound and no guarding.  Musculoskeletal:  Fistula noted in left upper extremity.  Neurological: He is alert.  Skin: Skin is warm and dry.  Psychiatric: He has a normal mood and affect.  Nursing note and vitals reviewed.    ED Treatments /  Results  Labs (all labs ordered are listed, but only abnormal results are displayed) Labs Reviewed  CBC WITH DIFFERENTIAL/PLATELET - Abnormal; Notable for the following components:      Result Value   RBC 3.19 (*)    Hemoglobin 9.3 (*)    HCT 30.2 (*)    All other components within normal limits  COMPREHENSIVE METABOLIC PANEL - Abnormal; Notable for the following components:   Potassium 6.3 (*)    BUN 67 (*)    Creatinine, Ser 22.33 (*)    AST 12 (*)    GFR calc non Af Amer 2 (*)    GFR calc Af Amer 3 (*)    All other components within normal limits  LIPASE, BLOOD - Abnormal; Notable for the following components:   Lipase 155 (*)    All other components within normal limits    EKG EKG Interpretation  Date/Time:  Tuesday January 13 2018 10:46:36 EDT Ventricular Rate:  77 PR Interval:    QRS Duration: 83 QT Interval:  383 QTC Calculation: 434 R Axis:   83 Text Interpretation:  Sinus rhythm When compared to prior, similar t wave shapness.  No STEMI Confirmed by Theda Belfast ([id number]) on 01/13/2018 1:36:03 PM   Radiology No results found.  Procedures Procedures (including critical care time)  Medications Ordered in ED Medications  Chlorhexidine Gluconate Cloth 2 % PADS 6 each (has no administration in time range)  ondansetron (ZOFRAN-ODT) disintegrating tablet 4 mg (4 mg Oral Given 01/13/18 1120)  acetaminophen (TYLENOL) tablet 650 mg (650 mg Oral Given 01/13/18 1355)     Initial Impression / Assessment and Plan / ED Course  I have reviewed the triage vital signs and the nursing notes.  Pertinent labs & imaging results that were available during my care of the patient were reviewed by me and considered in my medical decision making (see chart for details).     Patient seen and examined. Work-up initiated. Medications ordered. No active vomiting.   Vital signs reviewed and are as follows: BP (!) 175/116   Pulse 72   Resp 17   SpO2 96%   Patient has done well  during ED stay.  He has been able to eat and drink.  Slight suggestion of peaked T waves on EKG.  Potassium is elevated at 6.3.   Patient discussed with Dr. Rush Landmark.  I have spoken with Dr. Allena Katz of nephrology to discuss patient case.  He would prefer to dialyze the patient today while he is here.  He will make arrangements to do so.  Patient updated and is in agreement with plan.  He has had no further vomiting and appears comfortable.   Final Clinical Impressions(s) / ED Diagnoses   Final diagnoses:  Hyperkalemia  Non-intractable vomiting with nausea, unspecified vomiting type  ESRD (end stage renal disease) (HCC)  Elevated lipase   End-stage renal disease patient with nausea and vomiting.  This has been improving and has subsided during emergency department stay.  Patient has some mild upper abdominal pain.  Lab work is reassuring.  Patient has elevated lipase which he has had in the past.  I have low concern for pancreatitis in this clinical scenario.  Discussed case with nephrology given hyperkalemia.  They have made arrangements to dialyze patient today given that he missed his session this morning.  Anticipate discharge to home after dialysis.  ED Discharge Orders    None       Renne Crigler, [place] 01/13/18 1507    Tegeler, Canary Brim, MD 01/13/18 [phone]

## 2018-01-13 NOTE — ED Notes (Signed)
Pt given turkey sandwich

## 2018-01-13 NOTE — ED Notes (Signed)
Water given to pt 

## 2018-01-13 NOTE — Discharge Instructions (Signed)
Please read and follow all provided instructions.  Your diagnoses today include:  1. Hyperkalemia   2. Non-intractable vomiting with nausea, unspecified vomiting type   3. ESRD (end stage renal disease) (HCC)   4. Elevated lipase     Tests performed today include:  Blood counts and electrolytes - showed high potassium  Blood tests to check liver and kidney function  Blood tests to check pancreas function - was elevated  Vital signs. See below for your results today.   Medications prescribed:   None  Take any prescribed medications only as directed.  Home care instructions:   Follow any educational materials contained in this packet.  Follow-up instructions: Please follow-up with your primary care provider in the next 2 days for further evaluation of your symptoms.    Return instructions:  SEEK IMMEDIATE MEDICAL ATTENTION IF:  The pain does not go away or becomes severe   A temperature above 101F develops   Repeated vomiting occurs (multiple episodes)   The pain becomes localized to portions of the abdomen. The right side could possibly be appendicitis. In an adult, the left lower portion of the abdomen could be colitis or diverticulitis.   Blood is being passed in stools or vomit (bright red or black tarry stools)   You develop chest pain, difficulty breathing, dizziness or fainting, or become confused, poorly responsive, or inconsolable (young children)  If you have any other emergent concerns regarding your health  Additional Information: Abdominal (belly) pain can be caused by many things. Your caregiver performed an examination and possibly ordered blood/urine tests and imaging (CT scan, x-rays, ultrasound). Many cases can be observed and treated at home after initial evaluation in the emergency department. Even though you are being discharged home, abdominal pain can be unpredictable. Therefore, you need a repeated exam if your pain does not resolve, returns, or  worsens. Most patients with abdominal pain don't have to be admitted to the hospital or have surgery, but serious problems like appendicitis and gallbladder attacks can start out as nonspecific pain. Many abdominal conditions cannot be diagnosed in one visit, so follow-up evaluations are very important.  Your vital signs today were: BP (!) 170/119    Pulse 87    Resp 18    SpO2 95%  If your blood pressure (bp) was elevated above 135/85 this visit, please have this repeated by your doctor within one month. --------------

## 2018-01-13 NOTE — ED Triage Notes (Signed)
Per EMS: pt from home with c/o generalized weakness along with N/V.  Pt had 2 episodes of emesis yesterday, none reported today.  Pt states pain in lower back 7/10.  Pt is a dialysis pt with a schedule of T, TH, and Sat.  [facility]  Pt was evaluated here last Friday as well for similar symptoms.

## 2018-01-17 ENCOUNTER — Encounter (HOSPITAL_COMMUNITY): Payer: Self-pay

## 2018-01-17 ENCOUNTER — Other Ambulatory Visit: Payer: Self-pay

## 2018-01-17 ENCOUNTER — Non-Acute Institutional Stay (HOSPITAL_COMMUNITY)
Admission: EM | Admit: 2018-01-17 | Discharge: 2018-01-17 | Disposition: A | Discharge status: Home / Self Care | Payer: Medicaid Other | Attending: Emergency Medicine | Admitting: Emergency Medicine

## 2018-01-17 ENCOUNTER — Emergency Department (HOSPITAL_COMMUNITY): Payer: Medicaid Other

## 2018-01-17 DIAGNOSIS — I120 Hypertensive chronic kidney disease with stage 5 chronic kidney disease or end stage renal disease: Secondary | ICD-10-CM | POA: Diagnosis not present

## 2018-01-17 DIAGNOSIS — Z992 Dependence on renal dialysis: Secondary | ICD-10-CM | POA: Insufficient documentation

## 2018-01-17 DIAGNOSIS — Z79899 Other long term (current) drug therapy: Secondary | ICD-10-CM | POA: Diagnosis not present

## 2018-01-17 DIAGNOSIS — E875 Hyperkalemia: Secondary | ICD-10-CM | POA: Diagnosis not present

## 2018-01-17 DIAGNOSIS — D631 Anemia in chronic kidney disease: Secondary | ICD-10-CM | POA: Insufficient documentation

## 2018-01-17 DIAGNOSIS — R0602 Shortness of breath: Secondary | ICD-10-CM | POA: Insufficient documentation

## 2018-01-17 DIAGNOSIS — N186 End stage renal disease: Secondary | ICD-10-CM | POA: Insufficient documentation

## 2018-01-17 LAB — CBC WITH DIFFERENTIAL/PLATELET
ABS IMMATURE GRANULOCYTES: 0 10*3/uL (ref 0.0–0.1)
Basophils Absolute: 0.1 10*3/uL (ref 0.0–0.1)
Basophils Relative: 2 %
EOS PCT: 6 %
Eosinophils Absolute: 0.3 10*3/uL (ref 0.0–0.7)
HEMATOCRIT: 30.5 % — AB (ref 39.0–52.0)
HEMOGLOBIN: 9.4 g/dL — AB (ref 13.0–17.0)
Immature Granulocytes: 0 %
LYMPHS ABS: 1.3 10*3/uL (ref 0.7–4.0)
LYMPHS PCT: 25 %
MCH: 28.5 pg (ref 26.0–34.0)
MCHC: 30.8 g/dL (ref 30.0–36.0)
MCV: 92.4 fL (ref 78.0–100.0)
MONO ABS: 0.7 10*3/uL (ref 0.1–1.0)
MONOS PCT: 13 %
NEUTROS ABS: 2.9 10*3/uL (ref 1.7–7.7)
Neutrophils Relative %: 54 %
Platelets: 208 10*3/uL (ref 150–400)
RBC: 3.3 MIL/uL — ABNORMAL LOW (ref 4.22–5.81)
RDW: 14.8 % (ref 11.5–15.5)
WBC: 5.3 10*3/uL (ref 4.0–10.5)

## 2018-01-17 LAB — BASIC METABOLIC PANEL
Anion gap: 20 — ABNORMAL HIGH (ref 5–15)
BUN: 94 mg/dL — AB (ref 6–20)
CHLORIDE: 101 mmol/L (ref 98–111)
CO2: 17 mmol/L — AB (ref 22–32)
Calcium: 8.2 mg/dL — ABNORMAL LOW (ref 8.9–10.3)
Creatinine, Ser: 23.28 mg/dL — ABNORMAL HIGH (ref 0.61–1.24)
GFR calc Af Amer: 3 mL/min — ABNORMAL LOW (ref >60)
GFR calc non Af Amer: 2 mL/min — ABNORMAL LOW (ref >60)
Glucose, Bld: 77 mg/dL (ref 70–99)
POTASSIUM: 6.7 mmol/L — AB (ref 3.5–5.1)
Sodium: 138 mmol/L (ref 135–145)

## 2018-01-17 LAB — I-STAT VENOUS BLOOD GAS, ED
Acid-base deficit: 4 mmol/L — ABNORMAL HIGH (ref 0.0–2.0)
BICARBONATE: 21.8 mmol/L (ref 20.0–28.0)
O2 SAT: 91 %
PCO2 VEN: 41.1 mmHg — AB (ref 44.0–60.0)
PH VEN: 7.333 (ref 7.250–7.430)
TCO2: 23 mmol/L (ref 22–32)
pO2, Ven: 64 mmHg — ABNORMAL HIGH (ref 32.0–45.0)

## 2018-01-17 LAB — I-STAT TROPONIN, ED: TROPONIN I, POC: 0.02 ng/mL (ref 0.00–0.08)

## 2018-01-17 LAB — I-STAT CREATININE, ED: Creatinine, Ser: >18 mg/dL — ABNORMAL HIGH (ref 0.61–1.24)

## 2018-01-17 MED ORDER — LIDOCAINE HCL (PF) 1 % IJ SOLN: 5.0000 mL | INTRAMUSCULAR | Status: DC | PRN

## 2018-01-17 MED ORDER — ALBUTEROL SULFATE (2.5 MG/3ML) 0.083% IN NEBU
10.0000 mg | INHALATION_SOLUTION | Freq: Once | RESPIRATORY_TRACT | Status: AC
  Administered 2018-01-17: 10 mg via RESPIRATORY_TRACT
  Filled 2018-01-17: qty 12

## 2018-01-17 MED ORDER — HEPARIN SODIUM (PORCINE) 1000 UNIT/ML DIALYSIS
40.0000 [IU]/kg | INTRAMUSCULAR | Status: DC | PRN
  Filled 2018-01-17: qty 4

## 2018-01-17 MED ORDER — ALTEPLASE 2 MG IJ SOLR: 2.0000 mg | Freq: Once | INTRAMUSCULAR | Status: DC | PRN

## 2018-01-17 MED ORDER — ONDANSETRON HCL 4 MG/2ML IJ SOLN
4.0000 mg | Freq: Once | INTRAMUSCULAR | Status: AC
  Administered 2018-01-17: 4 mg via INTRAVENOUS
  Filled 2018-01-17: qty 2

## 2018-01-17 MED ORDER — LIDOCAINE-PRILOCAINE 2.5-2.5 % EX CREA
1.0000 "application " | TOPICAL_CREAM | CUTANEOUS | Status: DC | PRN
  Filled 2018-01-17: qty 5

## 2018-01-17 MED ORDER — INSULIN ASPART 100 UNIT/ML IV SOLN
5.0000 [IU] | Freq: Once | INTRAVENOUS | Status: AC
  Administered 2018-01-17: 5 [IU] via INTRAVENOUS
  Filled 2018-01-17: qty 0.05

## 2018-01-17 MED ORDER — HEPARIN SODIUM (PORCINE) 1000 UNIT/ML DIALYSIS
1000.0000 [IU] | INTRAMUSCULAR | Status: DC | PRN
  Filled 2018-01-17: qty 1

## 2018-01-17 MED ORDER — DEXTROSE 10 % IV SOLN
Freq: Once | INTRAVENOUS | Status: AC
  Administered 2018-01-17: 10:00:00 via INTRAVENOUS

## 2018-01-17 MED ORDER — SODIUM CHLORIDE 0.9 % IV SOLN: 100.0000 mL | INTRAVENOUS | Status: DC | PRN

## 2018-01-17 MED ORDER — PENTAFLUOROPROP-TETRAFLUOROETH EX AERO
1.0000 "application " | INHALATION_SPRAY | CUTANEOUS | Status: DC | PRN
  Filled 2018-01-17: qty 103.5

## 2018-01-17 MED ORDER — CHLORHEXIDINE GLUCONATE CLOTH 2 % EX PADS: 6.0000 | MEDICATED_PAD | Freq: Every day | CUTANEOUS | Status: DC

## 2018-01-17 NOTE — ED Notes (Signed)
RN Caryn BeeKevin advised of critical result with K @ 6.5

## 2018-01-17 NOTE — Discharge Instructions (Addendum)
Given your resolution of the shortness of breath after dialysis, the nephrology team and we feel you are safe for discharge home.  Please follow-up with your primary team.  If any symptoms change or worsen, please return to the nearest emergency department.

## 2018-01-17 NOTE — ED Triage Notes (Signed)
Per EMS, pt from home with complaint of shob and abd pain since 0615 this morning. Pt goes to dialysis Tues-thur-sat and called his ride to come take him to dialysis this morning but his ride did not show up. Hypertensive with ems. Axox4.

## 2018-01-17 NOTE — ED Notes (Signed)
Pt on breathing treatment, asking for nausea medication.

## 2018-01-17 NOTE — Progress Notes (Signed)
Patient ID: Jeremy Huerta, male   DOB: 1996/04/11, 22 y.o.   MRN: [id number]  Patient seen on dialysis.  BP (!) 170/128   Pulse 95   Temp 98 F (36.7 C) (Oral)   Resp 20   Ht 6' (1.829 m)   Wt 88.5 kg   SpO2 97%   BMI 26.46 kg/m   Discussed compliance and social barriers. Challenging EDW.   Zetta BillsJay Obadiah Dennard MD [facility]. Office # [phone] Pager # [phone] 2:43 PM

## 2018-01-17 NOTE — ED Provider Notes (Signed)
7:23 PM Patient was sent down after dialysis today for reassessment.   8:10 PM I assessed the patient he has no symptoms aside from mild headache.  Blood pressure is in the 190s which is more elevated than prior but improved from earlier.  His shortness of breath is completely resolved.  On my assessment, lungs were clear and chest was nontender.  Patient was having no other complaints.  Nephrology was called who felt patient was safe for discharge home given improvement in symptoms.    Patient will be discharged for outpatient management.   Clinical Impression: 1. Hyperkalemia   2. Shortness of breath     Disposition: Discharge  Condition: Good  I have discussed the results, Dx and Tx plan with the pt(& family if present). He/she/they expressed understanding and agree(s) with the plan. Discharge instructions discussed at great length. Strict return precautions discussed and pt &/or family have verbalized understanding of the instructions. No further questions at time of discharge.    New Prescriptions   No medications on file    [facility] [address] [id number] [place] [id number] [phone]          Saniyyah Elster, Canary Brimhristopher J, MD 01/17/18 2012

## 2018-01-17 NOTE — ED Notes (Addendum)
Report called to this RN from HD, pt coming back down due to nephrologist orders. Pt had 4L drawn off during dialysis. Pt has remained hypertensive in the around 200 systolic throughout dialysis.

## 2018-01-17 NOTE — Progress Notes (Signed)
Brought up from ED for HD treatment, Pretreatment  BP  Was 200/116, Patient admits to not taking any BP medication  Today, ending BP 208/126 patient will take BP medicationss when he gets home.  Patient returned to ED at end of treatment

## 2018-01-17 NOTE — ED Notes (Signed)
Pt has no complaints at this time  

## 2018-01-17 NOTE — ED Provider Notes (Signed)
[facility] EMERGENCY DEPARTMENT Provider Note   CSN: [id number] Arrival date & time: 01/17/18  3474     History   Chief Complaint Chief Complaint  Patient presents with  . Vascular Access Problem  . Shortness of Breath  . Abdominal Pain    HPI Jeremy Huerta is a 22 y.o. male.  22 yo M with a cc of needing dialysis.  Feeling sob and bad.  Felt for the past day.  Was supposed to have bus transportation this morning to dialysis but they did not show up.  He then called 911 for transport here.  Denies cp, vomiting, diarrhea.    The history is provided by the patient.  Illness  This is a new problem. The current episode started yesterday. The problem occurs constantly. The problem has not changed since onset.Associated symptoms include shortness of breath. Pertinent negatives include no chest pain, no abdominal pain and no headaches. Nothing aggravates the symptoms. Nothing relieves the symptoms. He has tried nothing for the symptoms. The treatment provided no relief.    Past Medical History:  Diagnosis Date  . Acute respiratory failure (HCC)   . ESRD (end stage renal disease) (HCC)    TTS  . FSGS (focal segmental glomerulosclerosis)   . Hypertension   . Nausea & vomiting 11/2016  . Pneumonia 06/24/2017    Patient Active Problem List   Diagnosis Date Noted  . ESRD (end stage renal disease) on dialysis (HCC) 01/09/2018  . Syncope 09/16/2017  . HAP (hospital-acquired pneumonia) 06/24/2017  . Sepsis (HCC) 06/24/2017  . Abdominal pain 06/24/2017  . Vomiting 04/29/2017  . Skin abscess 03/07/2017  . 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 Back pain 03/05/2017  . Hypertensive emergency 01/13/2017  . History of anemia due to chronic kidney disease 01/13/2017  . Secondary hyperparathyroidism (HCC) 01/13/2017  . Bleeding from the nose 01/13/2017  . Fever 01/13/2017  . Chest pain on breathing   . SOB (shortness of breath)   . Gastroesophageal reflux  disease   . Respiratory failure (HCC) 12/24/2016  . Acute pulmonary edema (HCC)   . End-stage renal disease on hemodialysis (HCC)   . Hypertensive urgency 12/02/2016  . Leukocytosis 12/02/2016  . Nausea & vomiting 12/02/2016  . Hyperkalemia 12/02/2016  . Acute respiratory failure (HCC) 04/22/2016  . Diarrhea 04/22/2016  . HCAP (healthcare-associated pneumonia) 04/22/2016  . Tachycardia 04/22/2016  . Hypertension     Past Surgical History:  Procedure Laterality Date  . AV FISTULA PLACEMENT  12/2015  . RENAL BIOPSY  2013        Home Medications    Prior to Admission medications   Medication Sig Start Date End Date Taking? Authorizing Provider  amLODipine (NORVASC) 10 MG tablet Take 1 tablet (10 mg total) by mouth daily. 09/18/17 01/13/18  Shahmehdi, Gemma Payor, MD  B Complex-C-Folic Acid (B COMPLEX-VITAMIN C-FOLIC ACID) 1 MG tablet Take 1 tablet by mouth daily. 04/30/16   [provider]  cinacalcet (SENSIPAR) 90 MG tablet Take 90 mg by mouth daily. 10/01/17   [provider]  cloNIDine (CATAPRES) 0.2 MG tablet Take 1 tablet (0.2 mg total) by mouth 3 (three) times daily. 09/18/17 01/13/18  Kendell Bane, MD  hydrALAZINE (APRESOLINE) 100 MG tablet Take 1 tablet (100 mg total) by mouth 3 (three) times daily. 09/18/17 01/13/18  Shahmehdi, Gemma Payor, MD  labetalol (NORMODYNE) 200 MG tablet Take 1 tablet (200 mg total) by mouth 3 (three) times daily. 09/18/17 01/13/18  Shahmehdi,  Seyed A, MD  sevelamer (RENAGEL) 800 MG tablet Take 800-3,200 mg by mouth See admin instructions. 2,400-3,200 mg three times a day with meals and 800 mg with each snack    [provider]    Family History Family History  Problem Relation Age of Onset  . Hypertension Mother   . Diabetes Father   . Hypertension Father   . Kidney disease Father   . Hypertension Maternal Grandmother   . Diabetes Paternal Grandmother   . Hypertension Paternal Grandmother     Social History Social History     Tobacco Use  . Smoking status: Never Smoker  . Smokeless tobacco: Never Used  Substance Use Topics  . Alcohol use: No  . Drug use: No     Allergies   Patient has no known allergies.   Review of Systems Review of Systems  Constitutional: Negative for chills and fever.  HENT: Negative for congestion and facial swelling.   Eyes: Negative for discharge and visual disturbance.  Respiratory: Positive for shortness of breath.   Cardiovascular: Negative for chest pain and palpitations.  Gastrointestinal: Negative for abdominal pain, diarrhea and vomiting.  Musculoskeletal: Negative for arthralgias and myalgias.  Skin: Negative for color change and rash.  Neurological: Negative for tremors, syncope and headaches.  Psychiatric/Behavioral: Negative for confusion and dysphoric mood.     Physical Exam Updated Vital Signs BP (!) 192/135   Pulse 75   Temp 98 F (36.7 C) (Oral)   Resp (!) 22   Ht 6' (1.829 m)   Wt 88.5 kg   SpO2 99%   BMI 26.46 kg/m   Physical Exam  Constitutional: He is oriented to person, place, and time. He appears well-developed and well-nourished.  HENT:  Head: Normocephalic and atraumatic.  Eyes: Pupils are equal, round, and reactive to light. EOM are normal.  Neck: Normal range of motion. Neck supple. No JVD present.  Cardiovascular: Normal rate and regular rhythm. Exam reveals no gallop and no friction rub.  No murmur heard. Pulmonary/Chest: No respiratory distress. He has no wheezes.  Abdominal: He exhibits no distension and no mass. There is no tenderness. There is no rebound.  Musculoskeletal: Normal range of motion.  L av fistula with palpable thrill  Neurological: He is alert and oriented to person, place, and time.  Skin: No rash noted. No pallor.  Psychiatric: He has a normal mood and affect. His behavior is normal.  Nursing note and vitals reviewed.    ED Treatments / Results  Labs (all labs ordered are listed, but only abnormal  results are displayed) Labs Reviewed  CBC WITH DIFFERENTIAL/PLATELET - Abnormal; Notable for the following components:      Result Value   RBC 3.30 (*)    Hemoglobin 9.4 (*)    HCT 30.5 (*)    All other components within normal limits  I-STAT VENOUS BLOOD GAS, ED - Abnormal; Notable for the following components:   pCO2, Ven 41.1 (*)    pO2, Ven 64.0 (*)    Acid-base deficit 4.0 (*)    All other components within normal limits  I-STAT CREATININE, ED - Abnormal; Notable for the following components:   Creatinine, Ser >18.00 (*)    All other components within normal limits  BASIC METABOLIC PANEL  I-STAT CHEM 8, ED  I-STAT TROPONIN, ED    EKG EKG Interpretation  Date/Time:  Saturday January 17 2018 08:53:53 EDT Ventricular Rate:  68 PR Interval:    QRS Duration: 105 QT Interval:  427 QTC Calculation: 455 R Axis:   77 Text Interpretation:  Sinus rhythm Nonspecific T abnormalities, lateral leads ST elev, probable normal early repol pattern peaked t waves Otherwise no significant change Confirmed by Melene Plan [phone]) on 01/17/2018 9:42:41 AM   Radiology Dg Chest 2 View  Result Date: 01/17/2018 CLINICAL DATA:  Per EMS, pt from home with complaint of shob and abd pain since 0615 this morning. Pt goes to dialysis Tues-thur-sat and called his ride to come take him to dialysis this morning but his ride did not show up. Hypertensive with ems. Axox4. hx of acute respiratory failure, HTN and PNA. Pt has some minor CP and nausea. Denies any other symptoms. EXAM: CHEST - 2 VIEW COMPARISON:  09/16/2017 FINDINGS: Heart, mediastinum and hila are within normal limits. Lungs are clear.  No pleural effusion or pneumothorax. Skeletal structures are unremarkable. IMPRESSION: No active cardiopulmonary disease. Electronically Signed   By: Amie Portland M.D.   On: 01/17/2018 09:30    Procedures Procedures (including critical care time)  Medications Ordered in ED Medications  insulin aspart (novoLOG)  injection 5 Units (has no administration in time range)  Chlorhexidine Gluconate Cloth 2 % PADS 6 each (has no administration in time range)  ondansetron (ZOFRAN) injection 4 mg (has no administration in time range)  albuterol (PROVENTIL) (2.5 MG/3ML) 0.083% nebulizer solution 10 mg (10 mg Nebulization Given 01/17/18 0954)  dextrose 10 % infusion ( Intravenous New Bag/Given 01/17/18 1004)     Initial Impression / Assessment and Plan / ED Course  I have reviewed the triage vital signs and the nursing notes.  Pertinent labs & imaging results that were available during my care of the patient were reviewed by me and considered in my medical decision making (see chart for details).     22 yo M with a chief complaint of shortness of breath.  This is been going on for the past day.  Patient was supposed to have dialysis this morning but was not picked up by the scat bus.  He then called 9 1 and came here.  Patient has clear lung sounds for me he has no JVD no peripheral edema.  He is hypertensive.  Will obtain labs chest x-ray and discussed with nephrology.  Chest x-ray reviewed by me without significant fluid overload.  I-STAT Chem-8 with hyperkalemia potassium is 6.5.  With the patient having peaked T waves on his EKG I will temporize.  Will discuss with nephrology.  I discussed the case with Dr. Allena Katz.  He will come and evaluate the patient and likely dialyze and then discharge home.  CRITICAL CARE Performed by: Rae Roam   Total critical care time: 35 minutes  Critical care time was exclusive of separately billable procedures and treating other patients.  Critical care was necessary to treat or prevent imminent or life-threatening deterioration.  Critical care was time spent personally by me on the following activities: development of treatment plan with patient and/or surrogate as well as nursing, discussions with consultants, evaluation of patient's response to treatment,  examination of patient, obtaining history from patient or surrogate, ordering and performing treatments and interventions, ordering and review of laboratory studies, ordering and review of radiographic studies, pulse oximetry and re-evaluation of patient's condition.  The patients results and plan were reviewed and discussed.   Any x-rays performed were independently reviewed by myself.   Differential diagnosis were considered with the presenting HPI.  Medications  insulin aspart (novoLOG) injection 5 Units (has no  administration in time range)  Chlorhexidine Gluconate Cloth 2 % PADS 6 each (has no administration in time range)  ondansetron (ZOFRAN) injection 4 mg (has no administration in time range)  albuterol (PROVENTIL) (2.5 MG/3ML) 0.083% nebulizer solution 10 mg (10 mg Nebulization Given 01/17/18 0954)  dextrose 10 % infusion ( Intravenous New Bag/Given 01/17/18 1004)    Vitals:   01/17/18 0850 01/17/18 0944 01/17/18 0957 01/17/18 1000  BP:  (!) 197/135  (!) 192/135  Pulse:  70  75  Resp:  15  (!) 22  Temp:      TempSrc:      SpO2:  95% 100% 99%  Weight: 88.5 kg     Height: 6' (1.829 m)       Final diagnoses:  Hyperkalemia  Shortness of breath    Admission/ observation were discussed with the admitting physician, patient and/or family and they are comfortable with the plan.   Final Clinical Impressions(s) / ED Diagnoses   Final diagnoses:  Hyperkalemia  Shortness of breath    ED Discharge Orders    None       Melene Plan, DO 01/17/18 1008

## 2018-03-03 ENCOUNTER — Emergency Department (HOSPITAL_COMMUNITY)
Admission: EM | Admit: 2018-03-03 | Discharge: 2018-03-03 | Disposition: A | Discharge status: Home / Self Care | Payer: Medicaid Other | Attending: Emergency Medicine | Admitting: Emergency Medicine

## 2018-03-03 ENCOUNTER — Encounter (HOSPITAL_COMMUNITY): Payer: Self-pay | Admitting: Emergency Medicine

## 2018-03-03 DIAGNOSIS — I120 Hypertensive chronic kidney disease with stage 5 chronic kidney disease or end stage renal disease: Secondary | ICD-10-CM | POA: Insufficient documentation

## 2018-03-03 DIAGNOSIS — Z992 Dependence on renal dialysis: Secondary | ICD-10-CM | POA: Diagnosis present

## 2018-03-03 DIAGNOSIS — Z79899 Other long term (current) drug therapy: Secondary | ICD-10-CM | POA: Diagnosis not present

## 2018-03-03 DIAGNOSIS — N186 End stage renal disease: Secondary | ICD-10-CM | POA: Insufficient documentation

## 2018-03-03 LAB — BASIC METABOLIC PANEL
ANION GAP: 15 (ref 5–15)
BUN: 89 mg/dL — ABNORMAL HIGH (ref 6–20)
CALCIUM: 8.8 mg/dL — AB (ref 8.9–10.3)
CHLORIDE: 103 mmol/L (ref 98–111)
CO2: 20 mmol/L — AB (ref 22–32)
Creatinine, Ser: 22.7 mg/dL — ABNORMAL HIGH (ref 0.61–1.24)
GFR calc Af Amer: 3 mL/min — ABNORMAL LOW (ref >60)
GFR calc non Af Amer: 2 mL/min — ABNORMAL LOW (ref >60)
Glucose, Bld: 83 mg/dL (ref 70–99)
Potassium: 6 mmol/L — ABNORMAL HIGH (ref 3.5–5.1)
Sodium: 138 mmol/L (ref 135–145)

## 2018-03-03 LAB — CBC WITH DIFFERENTIAL/PLATELET
Abs Immature Granulocytes: 0.01 10*3/uL (ref 0.00–0.07)
Basophils Absolute: 0.1 10*3/uL (ref 0.0–0.1)
Basophils Relative: 2 %
EOS ABS: 0.4 10*3/uL (ref 0.0–0.5)
EOS PCT: 7 %
HEMATOCRIT: 37.9 % — AB (ref 39.0–52.0)
HEMOGLOBIN: 11.6 g/dL — AB (ref 13.0–17.0)
Immature Granulocytes: 0 %
LYMPHS ABS: 1.6 10*3/uL (ref 0.7–4.0)
Lymphocytes Relative: 26 %
MCH: 29.1 pg (ref 26.0–34.0)
MCHC: 30.6 g/dL (ref 30.0–36.0)
MCV: 95 fL (ref 80.0–100.0)
MONO ABS: 0.6 10*3/uL (ref 0.1–1.0)
Monocytes Relative: 10 %
Neutro Abs: 3.4 10*3/uL (ref 1.7–7.7)
Neutrophils Relative %: 55 %
Platelets: 234 10*3/uL (ref 150–400)
RBC: 3.99 MIL/uL — AB (ref 4.22–5.81)
RDW: 13.7 % (ref 11.5–15.5)
WBC: 6.1 10*3/uL (ref 4.0–10.5)
nRBC: 0 % (ref 0.0–0.2)

## 2018-03-03 MED ORDER — CALCIUM GLUCONATE 10 % IV SOLN
1.0000 g | Freq: Once | INTRAVENOUS | Status: DC
  Filled 2018-03-03: qty 10

## 2018-03-03 NOTE — ED Notes (Signed)
Got patient some warm blankets patient is resting with call bell in reach  

## 2018-03-03 NOTE — Progress Notes (Signed)
CSW and CSW intern consulted for transportation. CSW and CSW intern spoke with patient at bedside and was informed that patient gets dialysis at facility on [facility]. CSW reached out to facility and was informed that they are able to administer dialysis to patient today. CSW and CSW intern provided patient with taxi voucher to dialysis at this time.  At this time, there are no further CSW needs. CSW and CSW intern signing off.  Claude Manges. Christion Leonhard, MSW, LCSW-A Emergency Department Clinical Social Worker [phone]

## 2018-03-03 NOTE — ED Notes (Signed)
GAVE PATIENT SOME WARM Jacqulynn Cadet

## 2018-03-03 NOTE — ED Notes (Addendum)
Discharge instructions discussed with Pt. Pt verbalized understanding. Pt stable and ambulatory.    

## 2018-03-03 NOTE — ED Notes (Signed)
This RN attempted to start an IV 3 times, MD Steinl notified, per MD don't delay d/c because pt needs to get to dialysis center by 1230.

## 2018-03-03 NOTE — Discharge Instructions (Signed)
It was our pleasure to provide your ER care today - we hope that you feel better.  We have discussed your case with your kidney doctors - they  have contacted your dialysis center and they've indicated they will do your dialysis at 12:30 today - go directly to your dialysis center for dialysis now.

## 2018-03-03 NOTE — ED Provider Notes (Signed)
[facility] EMERGENCY DEPARTMENT Provider Note   CSN: [id number] Arrival date & time: 03/03/18  0272     History   Chief Complaint Chief Complaint  Patient presents with  . Missed Dialysis    HPI Jeremy Huerta is a 22 y.o. male.  Patient with hx esrd/hd, Tues/Thu/Sat states missed dialysis Saturday and today, and came to ED instead. States normally gets there on [facility] bus, but problems w schedule/missed. Denies sob. No chest pain. No focal numbness or weakness. Normal appetite. No nvd. No fever or chills. States otherwise feels at baseline.   The history is provided by the patient.    Past Medical History:  Diagnosis Date  . Acute respiratory failure (HCC)   . ESRD (end stage renal disease) (HCC)    TTS  . FSGS (focal segmental glomerulosclerosis)   . Hypertension   . Nausea & vomiting 11/2016  . Pneumonia 06/24/2017    Patient Active Problem List   Diagnosis Date Noted  . ESRD (end stage renal disease) on dialysis (HCC) 01/09/2018  . Syncope 09/16/2017  . HAP (hospital-acquired pneumonia) 06/24/2017  . Sepsis (HCC) 06/24/2017  . Abdominal pain 06/24/2017  . Vomiting 04/29/2017  . Skin abscess 03/07/2017  . 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 Back pain 03/05/2017  . Hypertensive emergency 01/13/2017  . History of anemia due to chronic kidney disease 01/13/2017  . Secondary hyperparathyroidism (HCC) 01/13/2017  . Bleeding from the nose 01/13/2017  . Fever 01/13/2017  . Chest pain on breathing   . SOB (shortness of breath)   . Gastroesophageal reflux disease   . Respiratory failure (HCC) 12/24/2016  . Acute pulmonary edema (HCC)   . End-stage renal disease on hemodialysis (HCC)   . Hypertensive urgency 12/02/2016  . Leukocytosis 12/02/2016  . Nausea & vomiting 12/02/2016  . Hyperkalemia 12/02/2016  . Acute respiratory failure (HCC) 04/22/2016  . Diarrhea 04/22/2016  . HCAP (healthcare-associated pneumonia) 04/22/2016  .  Tachycardia 04/22/2016  . Hypertension     Past Surgical History:  Procedure Laterality Date  . AV FISTULA PLACEMENT  12/2015  . RENAL BIOPSY  2013        Home Medications    Prior to Admission medications   Medication Sig Start Date End Date Taking? Authorizing Provider  amLODipine (NORVASC) 10 MG tablet Take 1 tablet (10 mg total) by mouth daily. 09/18/17 01/17/18  Shahmehdi, Gemma Payor, MD  B Complex-C-Folic Acid (B COMPLEX-VITAMIN C-FOLIC ACID) 1 MG tablet Take 1 tablet by mouth daily. 04/30/16   [provider]  cinacalcet (SENSIPAR) 90 MG tablet Take 90 mg by mouth daily. 10/01/17   [provider]  cloNIDine (CATAPRES) 0.2 MG tablet Take 1 tablet (0.2 mg total) by mouth 3 (three) times daily. 09/18/17 01/17/18  Kendell Bane, MD  hydrALAZINE (APRESOLINE) 100 MG tablet Take 1 tablet (100 mg total) by mouth 3 (three) times daily. 09/18/17 01/17/18  Shahmehdi, Gemma Payor, MD  labetalol (NORMODYNE) 200 MG tablet Take 1 tablet (200 mg total) by mouth 3 (three) times daily. 09/18/17 01/17/18  Kendell Bane, MD  sevelamer (RENAGEL) 800 MG tablet Take 800-3,200 mg by mouth See admin instructions. 2,400-3,200 mg three times a day with meals and 800 mg with each snack    [provider]    Family History Family History  Problem Relation Age of Onset  . Hypertension Mother   . Diabetes Father   . Hypertension Father   . Kidney disease Father   .  Hypertension Maternal Grandmother   . Diabetes Paternal Grandmother   . Hypertension Paternal Grandmother     Social History Social History   Tobacco Use  . Smoking status: Never Smoker  . Smokeless tobacco: Never Used  Substance Use Topics  . Alcohol use: No  . Drug use: No     Allergies   Patient has no known allergies.   Review of Systems Review of Systems  Constitutional: Negative for fever.  HENT: Negative for sore throat.   Eyes: Negative for redness.  Respiratory: Negative for shortness of  breath.   Cardiovascular: Negative for chest pain.  Gastrointestinal: Negative for abdominal pain and vomiting.  Genitourinary: Negative for flank pain.  Musculoskeletal: Negative for back pain.  Skin: Negative for rash.  Neurological: Negative for headaches.  Hematological: Does not bruise/bleed easily.  Psychiatric/Behavioral: Negative for confusion.     Physical Exam Updated Vital Signs BP (!) 161/107 (BP Location: Right Arm)   Pulse 85   Temp 98 F (36.7 C) (Oral)   Resp 16   SpO2 100%   Physical Exam  Constitutional: He appears well-developed and well-nourished.  HENT:  Mouth/Throat: Oropharynx is clear and moist.  Eyes: Conjunctivae are normal.  Neck: Neck supple. No tracheal deviation present.  Cardiovascular: Normal rate, regular rhythm, normal heart sounds and intact distal pulses.  Pulmonary/Chest: Effort normal and breath sounds normal. No accessory muscle usage. No respiratory distress.  Abdominal: Soft. He exhibits no distension. There is no tenderness.  Musculoskeletal: He exhibits no edema.  LUE av fistula w palp thrill.   Neurological: He is alert.  Skin: Skin is warm and dry.  Psychiatric: He has a normal mood and affect.  Nursing note and vitals reviewed.    ED Treatments / Results  Labs (all labs ordered are listed, but only abnormal results are displayed) Results for orders placed or performed during the hospital encounter of 03/03/18  CBC with Differential  Result Value Ref Range   WBC 6.1 4.0 - 10.5 K/uL   RBC 3.99 (L) 4.22 - 5.81 MIL/uL   Hemoglobin 11.6 (L) 13.0 - 17.0 g/dL   HCT 16.1 (L) 09.6 - 04.5 %   MCV 95.0 80.0 - 100.0 fL   MCH 29.1 26.0 - 34.0 pg   MCHC 30.6 30.0 - 36.0 g/dL   RDW 40.9 81.1 - 91.4 %   Platelets 234 150 - 400 K/uL   nRBC 0.0 0.0 - 0.2 %   Neutrophils Relative % 55 %   Neutro Abs 3.4 1.7 - 7.7 K/uL   Lymphocytes Relative 26 %   Lymphs Abs 1.6 0.7 - 4.0 K/uL   Monocytes Relative 10 %   Monocytes Absolute 0.6 0.1  - 1.0 K/uL   Eosinophils Relative 7 %   Eosinophils Absolute 0.4 0.0 - 0.5 K/uL   Basophils Relative 2 %   Basophils Absolute 0.1 0.0 - 0.1 K/uL   Immature Granulocytes 0 %   Abs Immature Granulocytes 0.01 0.00 - 0.07 K/uL  Basic metabolic panel  Result Value Ref Range   Sodium 138 135 - 145 mmol/L   Potassium 6.0 (H) 3.5 - 5.1 mmol/L   Chloride 103 98 - 111 mmol/L   CO2 20 (L) 22 - 32 mmol/L   Glucose, Bld 83 70 - 99 mg/dL   BUN 89 (H) 6 - 20 mg/dL   Creatinine, Ser 78.29 (H) 0.61 - 1.24 mg/dL   Calcium 8.8 (L) 8.9 - 10.3 mg/dL   GFR calc non Af Amer 2 (  L) >60 mL/min   GFR calc Af Amer 3 (L) >60 mL/min   Anion gap 15 5 - 15    EKG EKG Interpretation  Date/Time:  Tuesday March 03 2018 09:38:54 EDT Ventricular Rate:  65 PR Interval:    QRS Duration: 114 QT Interval:  417 QTC Calculation: 434 R Axis:   91 Text Interpretation:  Sinus rhythm Non-specific ST-t changes No significant change since last tracing Confirmed by Cathren Laine ([id number]) on 03/03/2018 10:44:07 AM   Radiology No results found.  Procedures Procedures (including critical care time)  Medications Ordered in ED Medications - No data to display   Initial Impression / Assessment and Plan / ED Course  I have reviewed the triage vital signs and the nursing notes.  Pertinent labs & imaging results that were available during my care of the patient were reviewed by me and considered in my medical decision making (see chart for details).  Labs sent.  Reviewed nursing notes and prior charts for additional history.   Labs reviewed - k elevated. Ecg/monitor. Nephrology consulted.   Cagluc iv.   Discussed pt with Dr Arlean Hopping, including missed hd Saturday and today, and k 6 - he indicates he has made arrangement for dialysis at pts hd center, pt needs to be there at 12:30.   SW contacted by me - discussed pt - they will arrange transport to pts dialysis center.      Final Clinical Impressions(s) / ED  Diagnoses   Final diagnoses:  None    ED Discharge Orders    None       Cathren Laine, MD 03/03/18 1126

## 2018-06-11 IMAGING — DX DG THORACIC SPINE 2V
5 series · 5 of 5 positions shown · non-contrast
Comparison: PA and lateral chest x-ray June 24, 2017

CLINICAL DATA: Syncopal episode today with resultant fall. Patient
complains of neck and upper back pain.

EXAM:
THORACIC SPINE 2 VIEWS

[t thoracic spine ap]
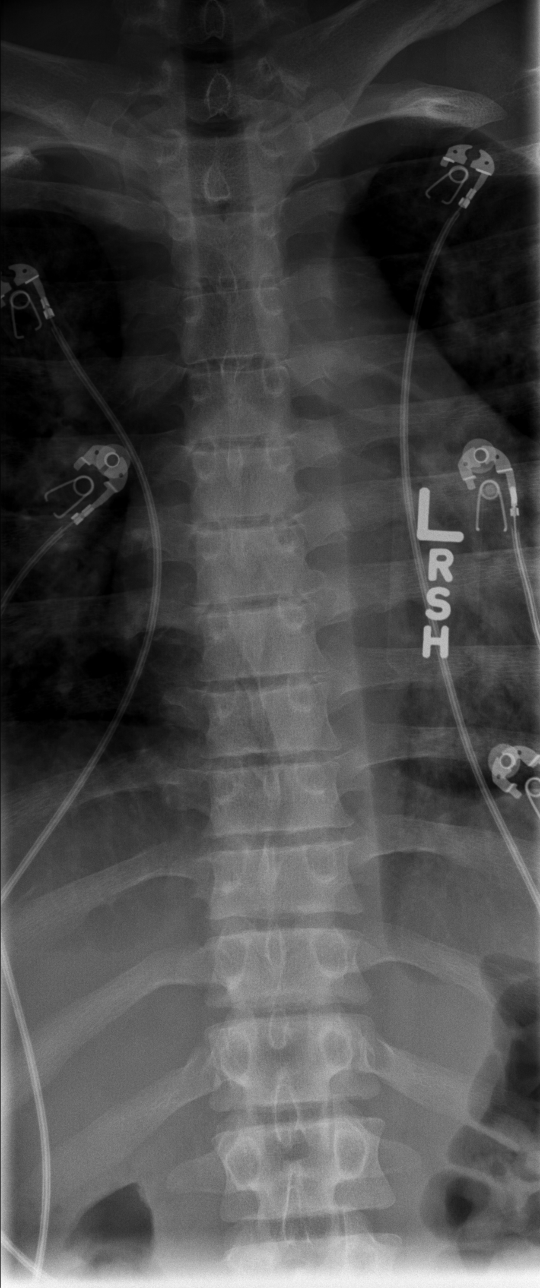

[t thoracic breathing lat (1 of 2)]
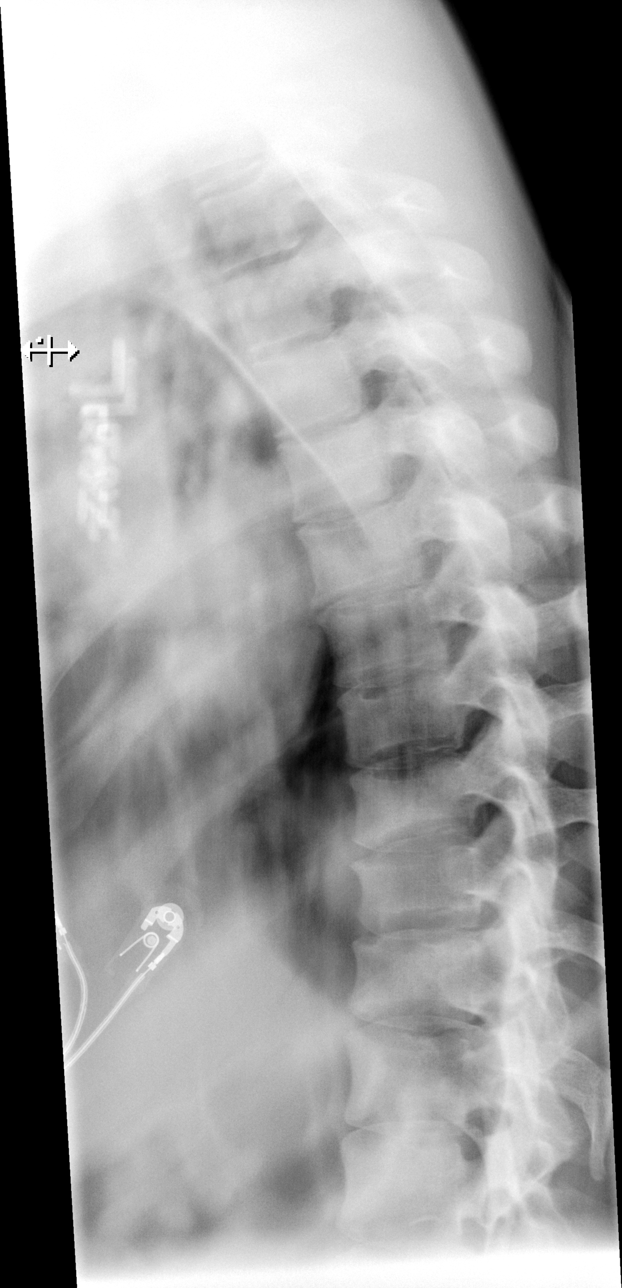

[t thoracic breathing lat (2 of 2)]
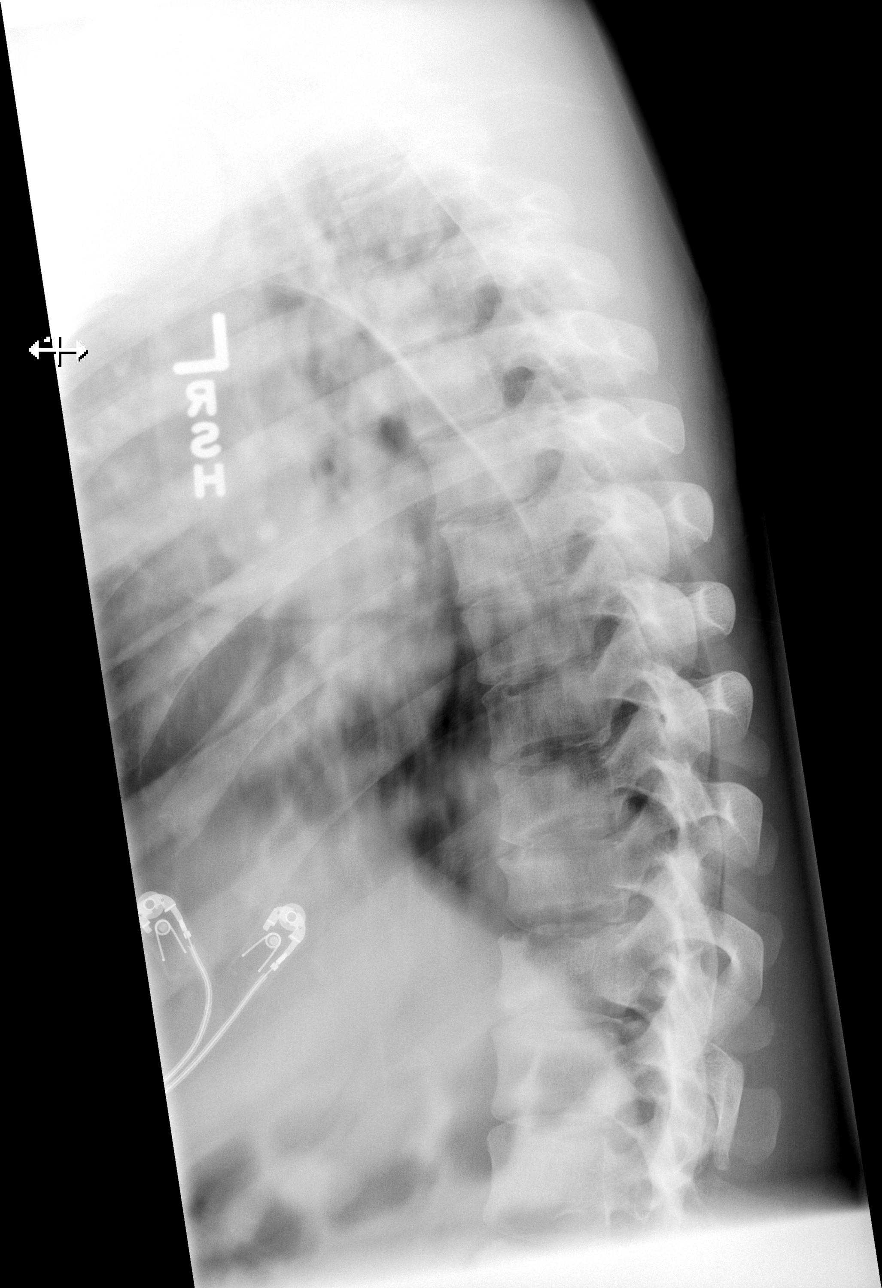

[t thoracic spine lat]
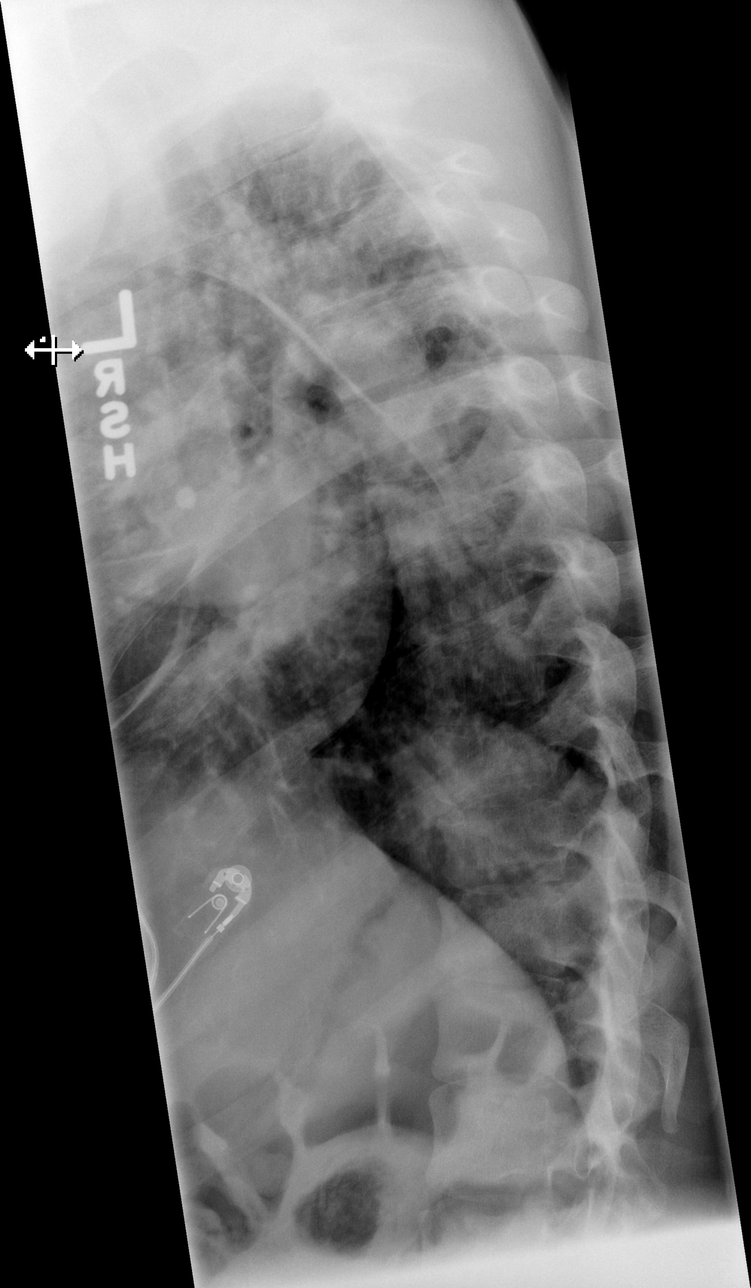

[t thoracic swimmers]
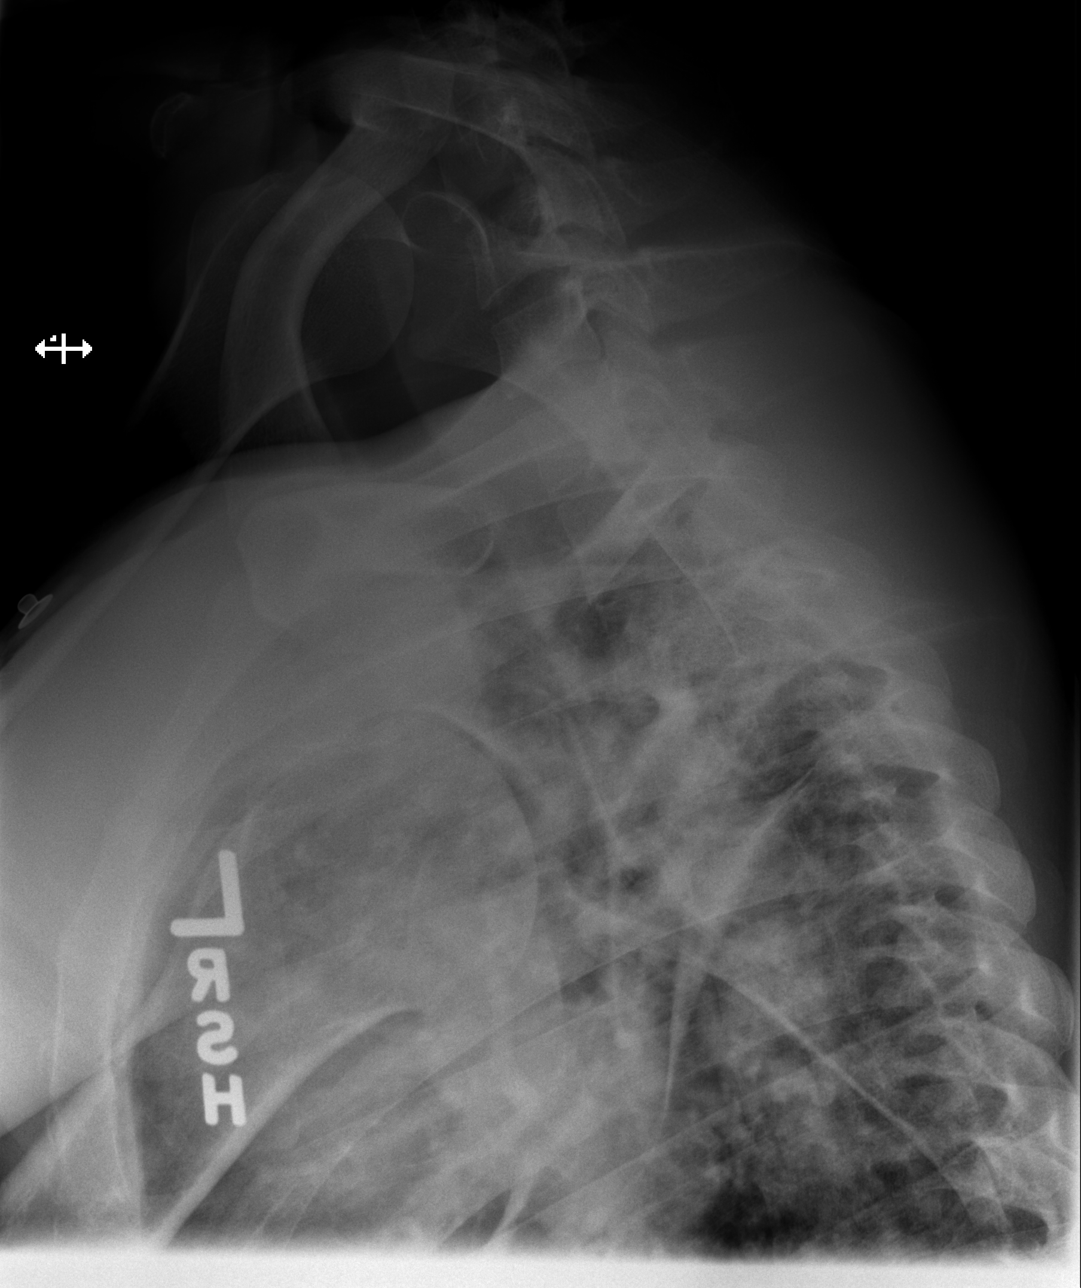

[5 of 5 positions shown; findings below may reference images not displayed]

FINDINGS: The thoracic vertebral bodies are preserved in height. The disc
space heights are well maintained. The pedicles are intact. There
are no abnormal paravertebral soft tissue densities.
IMPRESSION: There is no acute or significant chronic bony abnormality of the
thoracic spine.
